# Patient Record
Sex: Male | Born: 1955 | Race: White | Hispanic: No | Marital: Married | Smoking: Current every day smoker
Health system: Southern US, Community
[De-identification: ages and names within clinical notes are randomized; demographics above are authoritative.]

## PROBLEM LIST (undated history)

## (undated) DIAGNOSIS — J449 Chronic obstructive pulmonary disease, unspecified: Secondary | ICD-10-CM

## (undated) DIAGNOSIS — I2510 Atherosclerotic heart disease of native coronary artery without angina pectoris: Secondary | ICD-10-CM

## (undated) DIAGNOSIS — F419 Anxiety disorder, unspecified: Secondary | ICD-10-CM

## (undated) DIAGNOSIS — I700 Atherosclerosis of aorta: Secondary | ICD-10-CM

## (undated) DIAGNOSIS — T7840XA Allergy, unspecified, initial encounter: Secondary | ICD-10-CM

## (undated) HISTORY — DX: Anxiety disorder, unspecified: F41.9

## (undated) HISTORY — DX: Atherosclerotic heart disease of native coronary artery without angina pectoris: I25.10

## (undated) HISTORY — DX: Allergy, unspecified, initial encounter: T78.40XA

## (undated) HISTORY — DX: Chronic obstructive pulmonary disease, unspecified: J44.9

## (undated) HISTORY — DX: Atherosclerosis of aorta: I70.0

---

## 2014-11-20 ENCOUNTER — Ambulatory Visit (INDEPENDENT_AMBULATORY_CARE_PROVIDER_SITE_OTHER): Payer: Self-pay | Admitting: Family Medicine

## 2014-11-20 ENCOUNTER — Encounter: Payer: Self-pay | Admitting: Family Medicine

## 2014-11-20 VITALS — BP 155/86 | HR 86 | Temp 98.3°F | Resp 19 | Ht 72.0 in | Wt 220.4 lb

## 2014-11-20 DIAGNOSIS — F419 Anxiety disorder, unspecified: Secondary | ICD-10-CM | POA: Insufficient documentation

## 2014-11-20 DIAGNOSIS — F411 Generalized anxiety disorder: Secondary | ICD-10-CM

## 2014-11-20 DIAGNOSIS — R030 Elevated blood-pressure reading, without diagnosis of hypertension: Secondary | ICD-10-CM

## 2014-11-20 MED ORDER — CLONAZEPAM 0.5 MG PO TABS: 0.5000 mg | ORAL_TABLET | Freq: Two times a day (BID) | ORAL | Status: DC | PRN

## 2014-11-20 NOTE — Progress Notes (Signed)
Name: Stephen [name]   MRN: [id number]    DOB: 26-Oct-1955   Date:11/20/2014       Progress Note  Subjective  Chief Complaint  Chief Complaint  Patient presents with  . Establish Care    NP/patient wants prescription of a certain medication     Anxiety Presents for follow-up visit. Symptoms include excessive worry, irritability and nervous/anxious behavior. Patient reports no chest pain, insomnia, palpitations or shortness of breath. The symptoms are aggravated by family issues and work stress. The quality of sleep is fair (difficulty going back to sleep). Nighttime awakenings: one to two.   Past treatments include benzodiazephines (pt. has taken Klonopin  from his friend in the past and felt significant improvement in his symptoms of anxiety and sleep quality. ).  patient is requesting to be started on Klonopin for symptoms of anxiety.   History reviewed. No pertinent past medical history.  History reviewed. No pertinent past surgical history.  Family History  Problem Relation Age of Onset  . Hypertension Mother   . Hypothyroidism Mother   . Alzheimer's disease Father     Social History   Social History  . Marital Status: Married    Spouse Name: N/A  . Number of Children: N/A  . Years of Education: N/A   Occupational History  . Not on file.   Social History Main Topics  . Smoking status: Current Some Day Smoker -- 0.50 packs/day for 30 years    Types: Cigarettes  . Smokeless tobacco: Never Used  . Alcohol Use: No  . Drug Use: No  . Sexual Activity: Not on file   Other Topics Concern  . Not on file   Social History Narrative  . No narrative on file    No current outpatient prescriptions on file.  No Known Allergies   Review of Systems  Constitutional: Positive for irritability. Negative for fever and chills.  Respiratory: Negative for shortness of breath.   Cardiovascular: Negative for chest pain and palpitations.  Psychiatric/Behavioral: Negative for  depression. The patient is nervous/anxious. The patient does not have insomnia.      Objective  Filed Vitals:   11/20/14 1529  BP: 142/88  Pulse: 86  Temp: 98.3 F (36.8 C)  TempSrc: Oral  Resp: 19  Height: 6' (1.829 m)  Weight: 220 lb 6.4 oz (99.973 kg)  SpO2: 95%    Physical Exam  Constitutional: He is oriented to person, place, and time and well-developed, well-nourished, and in no distress.  HENT:  Head: Normocephalic and atraumatic.  Cardiovascular: Normal rate and regular rhythm.   Pulmonary/Chest: Effort normal.  Abdominal: Soft. Bowel sounds are normal.  Neurological: He is alert and oriented to person, place, and time.  Psychiatric: Memory, affect and judgment normal.  Nursing note and vitals reviewed.   Assessment & Plan  1. Generalized anxiety disorder Ration will be started on clonazepam 0.5 mg by mouth twice a day when necessary for symptoms of anxiety. Patient has been educated about the dependence potential of clonazepam. He has been asked to take the medication only as needed and only as directed and to contact us if he has any side effects after taking clonazepam. Patient verbalized understanding. Follow-up in one month.  - clonazePAM (KLONOPIN) 0.5 MG tablet; Take 1 tablet (0.5 mg total) by mouth 2 (two) times daily as needed for anxiety.  Dispense: 60 tablet; Refill: 0  2. Elevated blood-pressure reading without diagnosis of hypertension Blood pressure is persistently elevated on manual repeat. Patient asked  to keep a BP log to be reviewed in 2 weeks.   Gaston Dase Asad A. [facility] 11/20/2014 4:08 PM

## 2014-12-17 ENCOUNTER — Ambulatory Visit: Payer: Self-pay | Admitting: Family Medicine

## 2014-12-25 ENCOUNTER — Telehealth: Payer: Self-pay | Admitting: Family Medicine

## 2014-12-25 DIAGNOSIS — F411 Generalized anxiety disorder: Secondary | ICD-10-CM

## 2014-12-25 MED ORDER — CLONAZEPAM 0.5 MG PO TABS: 0.5000 mg | ORAL_TABLET | Freq: Two times a day (BID) | ORAL | Status: DC | PRN

## 2014-12-25 NOTE — Telephone Encounter (Signed)
Medication has been refilled patient will pick up prescription

## 2014-12-30 ENCOUNTER — Encounter: Payer: Self-pay | Admitting: Family Medicine

## 2014-12-30 ENCOUNTER — Ambulatory Visit (INDEPENDENT_AMBULATORY_CARE_PROVIDER_SITE_OTHER): Payer: Self-pay | Admitting: Family Medicine

## 2014-12-30 VITALS — BP 132/80 | HR 91 | Temp 98.0°F | Resp 18 | Ht 72.0 in | Wt 217.8 lb

## 2014-12-30 DIAGNOSIS — F411 Generalized anxiety disorder: Secondary | ICD-10-CM

## 2014-12-30 MED ORDER — CLONAZEPAM 1 MG PO TABS: 1.0000 mg | ORAL_TABLET | Freq: Two times a day (BID) | ORAL | Status: DC | PRN

## 2014-12-30 NOTE — Progress Notes (Signed)
Name: Stephen [name]   MRN: [id number]    DOB: 04/06/1955   Date:12/30/2014       Progress Note  Subjective  Chief Complaint  Chief Complaint  Patient presents with  . Follow-up    1 mo  . Anxiety    Anxiety Presents for follow-up visit. Symptoms include excessive worry, insomnia, irritability, muscle tension, nervous/anxious behavior, panic and shortness of breath.   There is no history of depression. Past treatments include benzodiazephines. The treatment provided moderate relief. Compliance with prior treatments has been good.  patient reports Klonopin is working moderately well for symptoms of anxiety.he is requesting an increase in the dosage to relieve his residual symptoms. Patient has taken the medication as directed, no side effects have been reported.  Past Medical History  Diagnosis Date  . Anxiety   . Allergy     seasonal    History reviewed. No pertinent past surgical history.  Family History  Problem Relation Age of Onset  . Hypertension Mother   . Hypothyroidism Mother   . Alzheimer's disease Father     Social History   Social History  . Marital Status: Married    Spouse Name: N/A  . Number of Children: N/A  . Years of Education: N/A   Occupational History  . Not on file.   Social History Main Topics  . Smoking status: Current Some Day Smoker -- 0.50 packs/day for 30 years    Types: Cigarettes  . Smokeless tobacco: Never Used  . Alcohol Use: No  . Drug Use: No  . Sexual Activity: Not on file   Other Topics Concern  . Not on file   Social History Narrative    Current outpatient prescriptions:  .  clonazePAM (KLONOPIN) 0.5 MG tablet, Take 1 tablet (0.5 mg total) by mouth 2 (two) times daily as needed for anxiety., Disp: 20 tablet, Rfl: 0  No Known Allergies   Review of Systems  Constitutional: Positive for irritability.  Respiratory: Positive for shortness of breath.   Psychiatric/Behavioral: The patient is nervous/anxious and has  insomnia.     Objective  Filed Vitals:   12/30/14 1029  BP: 132/80  Pulse: 91  Temp: 98 F (36.7 C)  TempSrc: Oral  Resp: 18  Height: 6' (1.829 m)  Weight: 217 lb 12.8 oz (98.793 kg)  SpO2: 96%    Physical Exam  Constitutional: He is oriented to person, place, and time and well-developed, well-nourished, and in no distress.  Neurological: He is alert and oriented to person, place, and time.  Psychiatric: Memory, affect and judgment normal.  Nursing note and vitals reviewed.  Assessment & Plan  1. Generalized anxiety disorder Increased dosage to 1 mg twice a day as needed. He is to take 0.5 mg 2 times daily during the day and 1 mg at bedtime as needed.  Recheck in one month. - clonazePAM (KLONOPIN) 1 MG tablet; Take 1 tablet (1 mg total) by mouth 2 (two) times daily as needed for anxiety.  Dispense: 60 tablet; Refill: 0   Syed Asad A. Faylene Kurtz [facility] 12/30/2014 10:40 AM

## 2015-01-13 ENCOUNTER — Telehealth: Payer: Self-pay | Admitting: Family Medicine
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
## 2015-01-14 NOTE — Telephone Encounter (Signed)
Routed to Dr. Shah °

## 2015-01-15 NOTE — Telephone Encounter (Signed)
Patient has appointment scheduled for 01/16/2015

## 2015-01-16 ENCOUNTER — Encounter: Payer: Self-pay | Admitting: Family Medicine

## 2015-01-16 ENCOUNTER — Ambulatory Visit (INDEPENDENT_AMBULATORY_CARE_PROVIDER_SITE_OTHER): Payer: Self-pay | Admitting: Family Medicine

## 2015-01-16 VITALS — BP 132/78 | HR 89 | Temp 98.7°F | Resp 18 | Ht 72.0 in | Wt 218.2 lb

## 2015-01-16 DIAGNOSIS — F411 Generalized anxiety disorder: Secondary | ICD-10-CM

## 2015-01-16 DIAGNOSIS — R062 Wheezing: Secondary | ICD-10-CM | POA: Insufficient documentation

## 2015-01-16 MED ORDER — CLONAZEPAM 1 MG PO TABS: 1.0000 mg | ORAL_TABLET | Freq: Two times a day (BID) | ORAL | Status: DC | PRN
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
## 2015-01-28 ENCOUNTER — Ambulatory Visit: Payer: Self-pay | Admitting: Family Medicine

## 2015-02-16 ENCOUNTER — Encounter: Payer: Self-pay | Admitting: Family Medicine

## 2015-02-16 ENCOUNTER — Ambulatory Visit (INDEPENDENT_AMBULATORY_CARE_PROVIDER_SITE_OTHER): Payer: Self-pay | Admitting: Family Medicine

## 2015-02-16 VITALS — BP 132/72 | HR 88 | Temp 98.0°F | Resp 18 | Ht 72.0 in | Wt 219.1 lb

## 2015-02-16 DIAGNOSIS — R062 Wheezing: Secondary | ICD-10-CM

## 2015-02-16 DIAGNOSIS — F411 Generalized anxiety disorder: Secondary | ICD-10-CM

## 2015-02-16 MED ORDER — CLONAZEPAM 1 MG PO TABS: 1.0000 mg | ORAL_TABLET | Freq: Two times a day (BID) | ORAL | Status: DC | PRN

## 2015-02-16 NOTE — Progress Notes (Signed)
Name: Stephen [name]   MRN: [id number]    DOB: September 11, 1955   Date:02/16/2015       Progress Note  Subjective  Chief Complaint  Chief Complaint  Patient presents with  . Follow-up    1 mo  . Medication Refill    clonazepam     Anxiety Presents for follow-up visit. The problem has been gradually improving (Improved since therapy with clonazepam was started.). Symptoms include excessive worry, insomnia, irritability, malaise and nervous/anxious behavior. Patient reports no chest pain, depressed mood, palpitations or shortness of breath. The severity of symptoms is moderate. The symptoms are aggravated by work stress.   His past medical history is significant for anxiety/panic attacks. There is no history of bipolar disorder or depression. Past treatments include benzodiazephines. The treatment provided significant relief. Compliance with prior treatments has been good.    Past Medical History  Diagnosis Date  . Anxiety   . Allergy     seasonal    History reviewed. No pertinent past surgical history.  Family History  Problem Relation Age of Onset  . Hypertension Mother   . Hypothyroidism Mother   . Alzheimer's disease Father     Social History   Social History  . Marital Status: Married    Spouse Name: N/A  . Number of Children: N/A  . Years of Education: N/A   Occupational History  . Not on file.   Social History Main Topics  . Smoking status: Current Some Day Smoker -- 0.50 packs/day for 30 years    Types: Cigarettes  . Smokeless tobacco: Never Used  . Alcohol Use: No  . Drug Use: No  . Sexual Activity: Not on file   Other Topics Concern  . Not on file   Social History Narrative     Current outpatient prescriptions:  .  clonazePAM (KLONOPIN) 1 MG tablet, Take 1 tablet (1 mg total) by mouth 2 (two) times daily as needed for anxiety., Disp: 60 tablet, Rfl: 0  No Known Allergies   Review of Systems  Constitutional: Positive for irritability. Negative  for fever, chills and malaise/fatigue.  Respiratory: Positive for wheezing. Negative for cough, sputum production and shortness of breath.   Cardiovascular: Negative for chest pain and palpitations.  Psychiatric/Behavioral: Negative for depression. The patient is nervous/anxious and has insomnia.     Objective  Filed Vitals:   02/16/15 1047  BP: 132/72  Pulse: 88  Temp: 98 F (36.7 C)  TempSrc: Oral  Resp: 18  Height: 6' (1.829 m)  Weight: 219 lb 1.6 oz (99.383 kg)  SpO2: 94%    Physical Exam  Constitutional: He is oriented to person, place, and time and well-developed, well-nourished, and in no distress.  Cardiovascular: Normal rate, regular rhythm and normal heart sounds.   Pulmonary/Chest: Effort normal. He has wheezes.  Neurological: He is alert and oriented to person, place, and time.  Psychiatric: Mood, memory, affect and judgment normal.  Nursing note and vitals reviewed.   Assessment & Plan  1. Generalized anxiety disorder  Symptoms of anxiety stable on clonazepam 1 mg twice a day when necessary. Refills provided and follow-up in one month. - clonazePAM (KLONOPIN) 1 MG tablet; Take 1 tablet (1 mg total) by mouth 2 (two) times daily as needed for anxiety.  Dispense: 60 tablet; Refill: 0  2. Wheezing on auscultation  Likely from ongoing tobacco abuse. No respiratory distress and O2 sats are normal. No concerning symptoms. Obtain chest x-ray for assessment - DG Chest 2 View; Future  Lasonja Lakins Asad A. Faylene KurtzShah [facility] 02/16/2015 10:51 AM

## 2015-03-17 ENCOUNTER — Encounter: Payer: Self-pay | Admitting: Emergency Medicine

## 2015-03-17 ENCOUNTER — Other Ambulatory Visit: Payer: Self-pay | Admitting: Family Medicine

## 2015-03-17 ENCOUNTER — Emergency Department
Admission: EM | Admit: 2015-03-17 | Discharge: 2015-03-17 | Disposition: A | Discharge status: Home / Self Care | Payer: Self-pay | Attending: Emergency Medicine | Admitting: Emergency Medicine

## 2015-03-17 DIAGNOSIS — F411 Generalized anxiety disorder: Secondary | ICD-10-CM

## 2015-03-17 DIAGNOSIS — K0381 Cracked tooth: Secondary | ICD-10-CM | POA: Insufficient documentation

## 2015-03-17 DIAGNOSIS — K0889 Other specified disorders of teeth and supporting structures: Secondary | ICD-10-CM | POA: Insufficient documentation

## 2015-03-17 DIAGNOSIS — F419 Anxiety disorder, unspecified: Secondary | ICD-10-CM | POA: Insufficient documentation

## 2015-03-17 DIAGNOSIS — K029 Dental caries, unspecified: Secondary | ICD-10-CM | POA: Insufficient documentation

## 2015-03-17 DIAGNOSIS — F17210 Nicotine dependence, cigarettes, uncomplicated: Secondary | ICD-10-CM | POA: Insufficient documentation

## 2015-03-17 MED ORDER — IBUPROFEN 800 MG PO TABS
800.0000 mg | ORAL_TABLET | Freq: Once | ORAL | Status: AC
  Administered 2015-03-17: 800 mg via ORAL
  Filled 2015-03-17: qty 1

## 2015-03-17 MED ORDER — CLONAZEPAM 1 MG PO TABS: 1.0000 mg | ORAL_TABLET | Freq: Two times a day (BID) | ORAL | Status: DC | PRN

## 2015-03-17 MED ORDER — AMOXICILLIN 500 MG PO CAPS: 500.0000 mg | ORAL_CAPSULE | Freq: Three times a day (TID) | ORAL | Status: DC

## 2015-03-17 MED ORDER — IBUPROFEN 800 MG PO TABS: 800.0000 mg | ORAL_TABLET | Freq: Three times a day (TID) | ORAL | Status: DC | PRN

## 2015-03-17 MED ORDER — TRAMADOL HCL 50 MG PO TABS: 50.0000 mg | ORAL_TABLET | Freq: Four times a day (QID) | ORAL | Status: DC | PRN

## 2015-03-17 MED ORDER — OXYCODONE-ACETAMINOPHEN 5-325 MG PO TABS
1.0000 | ORAL_TABLET | Freq: Once | ORAL | Status: AC
  Administered 2015-03-17: 1 via ORAL
  Filled 2015-03-17: qty 1

## 2015-03-17 MED ORDER — LIDOCAINE VISCOUS 2 % MT SOLN
15.0000 mL | Freq: Once | OROMUCOSAL | Status: AC
  Administered 2015-03-17: 15 mL via OROMUCOSAL
  Filled 2015-03-17: qty 15

## 2015-03-17 NOTE — Telephone Encounter (Signed)
Requesting refill on Klonopin. Patient states that he know it is not due until the 14th however he is working in town today and would like to pick it up.

## 2015-03-17 NOTE — Discharge Instructions (Signed)
Follow-up with process. [facility] or from the list of dental clinic as provided. OPTIONS FOR DENTAL FOLLOW UP CARE  [place] Department of Health and Human Services - Local Safety Net Dental Clinics [email or web address]://www.ncdhhs.gov/dph/oralhealth/services/safetynetclinics.htm   [facility] [phone])  [facility] [phone])  [facility] [phone]  [facility] [phone])  [facility] [phone]) This clinic caters to the indigent population and is on a lottery system. Location: [employer], [address] [address], [address], [place] [facility] Hours: Wednesdays from 6pm - 9pm, patients seen by a lottery system. For dates, call or go to [email or web address] Services: Cleanings, fillings and simple extractions. Payment Options: DENTAL WORK IS FREE OF CHARGE. Bring proof of income or support. Best way to get seen: Arrive at 5:15 pm - this is a lottery, NOT first come/first serve, so arriving earlier will not increase your chances of being seen.     [facility] [phone] Select option 1 for emergencies   Location: [facility], [place] [place], [address], [place] [facility] Hours: No walk-ins accepted - call the day before to schedule an appointment. Check in times are 9:30 am and 1:30 pm. Services: Simple extractions, temporary fillings, pulpectomy/pulp debridement, uncomplicated abscess drainage. Payment Options: PAYMENT IS DUE AT THE TIME OF SERVICE.  Fee is usually $100-200, additional surgical procedures (e.g. abscess drainage) may be extra. Cash, checks, Visa/MasterCard accepted.  Can file Medicaid if patient is covered for dental - patient should call case worker to check. No discount for [facility] patients. Best way to get seen: MUST call the day before and get onto the schedule. Can usually be seen the next  1-2 days. No walk-ins accepted.     [facility] [phone]   Location: [facility], [address], [place] [facility] Hours: M, W, Th, F 8am or 1:30pm, Tues 9a or 1:30 - first come/first served. Services: Simple extractions, temporary fillings, uncomplicated abscess drainage.  You do not need to be an [place] [facility] [place] [facility] [place] resident. Payment Options: PAYMENT IS DUE AT THE TIME OF SERVICE. Dental insurance, otherwise sliding scale - bring proof of income or support. Depending on income and treatment needed, cost is usually $50-200. Best way to get seen: Arrive early as it is first come/first served.     [facility] [phone]   Location: [address] Clinic Hours: Mon-Thu 8a-5p Services: Most basic dental services including extractions and fillings. Payment Options: PAYMENT IS DUE AT THE TIME OF SERVICE. Sliding scale, up to 50% off - bring proof if income or support. Medicaid with dental option accepted. Best way to get seen: Call to schedule an appointment, can usually be seen within 2 weeks OR they will try to see walk-ins - show up at 8a or 2p (you may have to wait).     [facility] [phone] [place] RESIDENTS ONLY   Location: [facility], [address], [place], [place] [address] Clinic Hours: By appointment only. Monday - Thursday 8am-5pm, Friday 8am-12pm Services: Cleanings, fillings, extractions. Payment Options: PAYMENT IS DUE AT THE TIME OF SERVICE. Cash, Visa or MasterCard. Sliding scale - $30 minimum per service. Best way to get seen: Come in to office, complete packet and make an appointment - need proof of income or support monies for each household member and proof of [place] residence. Usually takes about a month to get in.  [facility] [phone]   Location: [address],  [place] Clinic Hours: Walk-in Urgent Care Dental Services are offered Monday-Friday mornings only. The numbers of emergencies accepted daily is limited to the number of providers available. Maximum 15 - Mondays, Wednesdays & Thursdays Maximum 10 - Tuesdays & Fridays Services: You do not need to be a [facility] resident to be seen for a dental emergency. Emergencies are defined as pain, swelling, abnormal bleeding, or dental trauma. Walkins will receive x-rays if needed. NOTE: Dental cleaning is not an emergency. Payment Options: PAYMENT IS DUE AT THE TIME OF SERVICE. Minimum co-pay is $40.00 for uninsured patients. Minimum co-pay is $3.00 for Medicaid with dental coverage. Dental Insurance is accepted and must be presented at time of visit. Medicare does not cover dental. Forms of payment: Cash, credit card, checks. Best way to get seen: If not previously registered with the clinic, walk-in dental registration begins at 7:15 am and is on a first come/first serve basis. If previously registered with the clinic, call to make an appointment.     [facility] [place] ONLY   Location: [address], [place], [place] Clinic Hours: Mon-Thu 10a-2p Services: Extractions only! Payment Options: FREE (donations accepted) - bring proof of income or support Best way to get seen: Call and schedule an appointment OR come at 8am on the 1st Monday of every month (except for holidays) when it is first come/first served.     [facility] [phone]   Location: [place], [place] Clinic Hours: Friday mornings Services, Payment Options, Best way to get seen: Call for info

## 2015-03-17 NOTE — ED Provider Notes (Signed)
[facility] Emergency Department Provider Note  ____________________________________________  Time seen: Approximately 11:54 AM  I have reviewed the triage vital signs and the nursing notes.   HISTORY  Chief Complaint Dental Pain    HPI Stephen [name] is a 59 y.o. male patient complaining of right upper dental pain for 5 days. Patient states nose some swelling to the upper jaw yesterday. Patient has a history of devitalized fracture teeth. Patient has not follow-up with dentist secondary to lack of insurance. Patient is rating his pain as a 10 over 10. Patient is taken 3 tablets of erythromycin that he received from a friend. Patient states nose no improvement with taking those antibiotics.   Past Medical History  Diagnosis Date  . Anxiety   . Allergy     seasonal    Patient Active Problem List   Diagnosis Date Noted  . Wheezing on auscultation 01/16/2015  . Anxiety disorder 11/20/2014    History reviewed. No pertinent past surgical history.  Current Outpatient Rx  Name  Route  Sig  Dispense  Refill  . amoxicillin (AMOXIL) 500 MG capsule   Oral   Take 1 capsule (500 mg total) by mouth 3 (three) times daily.   30 capsule   0   . clonazePAM (KLONOPIN) 1 MG tablet   Oral   Take 1 tablet (1 mg total) by mouth 2 (two) times daily as needed for anxiety.   60 tablet   0   . ibuprofen (ADVIL,MOTRIN) 800 MG tablet   Oral   Take 1 tablet (800 mg total) by mouth every 8 (eight) hours as needed.   30 tablet   0   . traMADol (ULTRAM) 50 MG tablet   Oral   Take 1 tablet (50 mg total) by mouth every 6 (six) hours as needed.   20 tablet   0     Allergies Review of patient's allergies indicates no known allergies.  Family History  Problem Relation Age of Onset  . Hypertension Mother   . Hypothyroidism Mother   . Alzheimer's disease Father     Social History Social History  Substance Use Topics  . Smoking status: Current Some Day Smoker  -- 0.50 packs/day for 30 years    Types: Cigarettes  . Smokeless tobacco: Never Used  . Alcohol Use: No    Review of Systems Constitutional: No fever/chills Eyes: No visual changes. ENT: No sore throat. Dental pain Cardiovascular: Denies chest pain. Respiratory: Denies shortness of breath. Gastrointestinal: No abdominal pain.  No nausea, no vomiting.  No diarrhea.  No constipation. Genitourinary: Negative for dysuria. Musculoskeletal: Negative for back pain. Skin: Negative for rash. Neurological: Negative for headaches, focal weakness or numbness. Psychiatric:Anxiety 10-point ROS otherwise negative.  ____________________________________________   PHYSICAL EXAM:  VITAL SIGNS: ED Triage Vitals  Enc Vitals Group     BP 03/17/15 1143 153/88 mmHg     Pulse Rate 03/17/15 1143 83     Resp 03/17/15 1143 16     Temp 03/17/15 1143 98.3 F (36.8 C)     Temp Source 03/17/15 1143 Oral     SpO2 03/17/15 1143 95 %     Weight 03/17/15 1143 220 lb (99.791 kg)     Height 03/17/15 1143 6' (1.829 m)     Head Cir --      Peak Flow --      Pain Score 03/17/15 1144 10     Pain Loc --      Pain Edu? --  Excl. in GC? --     Constitutional: Alert and oriented. Well appearing and in no acute distress. Eyes: Conjunctivae are normal. PERRL. EOMI. Head: Atraumatic. Nose: No congestion/rhinnorhea. Mouth/Throat: Mucous membranes are moist.  Oropharynx non-erythematous. Multiple devitalized fracture teeth upper and lower right side Neck: No stridor.  No cervical spine tenderness to palpation. Hematological/Lymphatic/Immunilogical: No cervical lymphadenopathy. Cardiovascular: Normal rate, regular rhythm. Grossly normal heart sounds.  Good peripheral circulation. Respiratory: Normal respiratory effort.  No retractions. Lungs CTAB. Gastrointestinal: Soft and nontender. No distention. No abdominal bruits. No CVA tenderness. Musculoskeletal: No lower extremity tenderness nor edema.  No joint  effusions. Neurologic:  Normal speech and language. No gross focal neurologic deficits are appreciated. No gait instability. Skin:  Skin is warm, dry and intact. No rash noted. Psychiatric: Mood and affect are normal. Speech and behavior are normal.  ____________________________________________   LABS (all labs ordered are listed, but only abnormal results are displayed)  Labs Reviewed - No data to display ____________________________________________  EKG   ____________________________________________  RADIOLOGY   ____________________________________________   PROCEDURES  Procedure(s) performed: None  Critical Care performed: No  ____________________________________________   INITIAL IMPRESSION / ASSESSMENT AND PLAN / ED COURSE  Pertinent labs & imaging results that were available during my care of the patient were reviewed by me and considered in my medical decision making (see chart for details).  Dental pain secondary to fractured teeth and caries. Patient given prescription for amoxicillin. tramadol and ibuprofen. Patient advised to follow with the [facility]. ____________________________________________   FINAL CLINICAL IMPRESSION(S) / ED DIAGNOSES  Final diagnoses:  Pain due to dental caries       Joni Reiningonald K Smith, PA-C 03/17/15 1223  Minna AntisKevin Paduchowski, MD 03/17/15 [phone]

## 2015-03-17 NOTE — Telephone Encounter (Signed)
Routed to Dr. Sherryll BurgerShah for approval, Requesting refill on Klonopin. Patient states that he know it is not due until the 14th however he is working in town today and would like to pick it up.

## 2015-03-17 NOTE — ED Notes (Signed)
C/o right upper dental pain.  Onset of symptoms Thursday.  Right upper jaw swelling noted.

## 2015-03-17 NOTE — ED Notes (Signed)
Patient has taken 3 tablets of Erythromycin 250 mg tablets that he had gotten from a friend.

## 2015-03-17 NOTE — ED Notes (Signed)
AAOx3.  Skin warm and dry.  NAD.  D/C home 

## 2015-03-18 ENCOUNTER — Telehealth: Payer: Self-pay | Admitting: Family Medicine

## 2015-03-18 NOTE — Telephone Encounter (Signed)
Provider has signed prescription and it was to front staff to place in RX book.  Pt was notified that the prescription was ready for pick up.

## 2015-03-18 NOTE — Telephone Encounter (Signed)
Pt would like a refill on Klonipan.

## 2015-03-18 NOTE — Telephone Encounter (Signed)
PT IS NEEDING REFILL FOR ANXIETY. HE IS GOING OUT OF TOWN AND NEEDS THIS TODAY.

## 2015-04-15 ENCOUNTER — Other Ambulatory Visit: Payer: Self-pay | Admitting: Family Medicine

## 2015-04-15 DIAGNOSIS — F411 Generalized anxiety disorder: Secondary | ICD-10-CM

## 2015-04-15 MED ORDER — CLONAZEPAM 1 MG PO TABS: 1.0000 mg | ORAL_TABLET | Freq: Two times a day (BID) | ORAL | Status: DC | PRN

## 2015-04-15 NOTE — Telephone Encounter (Signed)
Pt needs refill on Klonipen. Pt will be out on Saturday 04/18/15

## 2015-04-15 NOTE — Telephone Encounter (Signed)
Routed to Dr. Shah for medication refill approval 

## 2015-05-20 ENCOUNTER — Ambulatory Visit (INDEPENDENT_AMBULATORY_CARE_PROVIDER_SITE_OTHER): Payer: Self-pay | Admitting: Family Medicine

## 2015-05-20 ENCOUNTER — Encounter: Payer: Self-pay | Admitting: Family Medicine

## 2015-05-20 VITALS — BP 132/78 | HR 88 | Temp 98.9°F | Resp 19 | Ht 72.0 in | Wt 220.2 lb

## 2015-05-20 DIAGNOSIS — IMO0001 Reserved for inherently not codable concepts without codable children: Secondary | ICD-10-CM | POA: Insufficient documentation

## 2015-05-20 DIAGNOSIS — R062 Wheezing: Secondary | ICD-10-CM

## 2015-05-20 DIAGNOSIS — R030 Elevated blood-pressure reading, without diagnosis of hypertension: Secondary | ICD-10-CM

## 2015-05-20 DIAGNOSIS — F411 Generalized anxiety disorder: Secondary | ICD-10-CM

## 2015-05-20 MED ORDER — CLONAZEPAM 1 MG PO TABS: 1.0000 mg | ORAL_TABLET | Freq: Two times a day (BID) | ORAL | Status: DC | PRN

## 2015-05-20 NOTE — Progress Notes (Signed)
Name: Gianluca Chhim   MRN: [id number]    DOB: 11-12-1955   Date:05/20/2015       Progress Note  Subjective  Chief Complaint  Chief Complaint  Patient presents with  . Follow-up    3 mo  . Anxiety  . Medication Refill    clonazepam 1 mg     Anxiety Presents for follow-up visit. The problem has been gradually improving. Patient reports no chest pain, excessive worry, insomnia, irritability, nervous/anxious behavior, panic or shortness of breath.   Past treatments include benzodiazephines. The treatment provided significant relief. Compliance with prior treatments has been good.     Past Medical History  Diagnosis Date  . Anxiety   . Allergy     seasonal    History reviewed. No pertinent past surgical history.  Family History  Problem Relation Age of Onset  . Hypertension Mother   . Hypothyroidism Mother   . Alzheimer's disease Father     Social History   Social History  . Marital Status: Married    Spouse Name: N/A  . Number of Children: N/A  . Years of Education: N/A   Occupational History  . Not on file.   Social History Main Topics  . Smoking status: Current Some Day Smoker -- 0.50 packs/day for 30 years    Types: Cigarettes  . Smokeless tobacco: Never Used  . Alcohol Use: No  . Drug Use: No  . Sexual Activity: Not on file   Other Topics Concern  . Not on file   Social History Narrative     Current outpatient prescriptions:  .  clonazePAM (KLONOPIN) 1 MG tablet, Take 1 tablet (1 mg total) by mouth 2 (two) times daily as needed for anxiety., Disp: 60 tablet, Rfl: 0 .  amoxicillin (AMOXIL) 500 MG capsule, Take 1 capsule (500 mg total) by mouth 3 (three) times daily. (Patient not taking: Reported on 05/20/2015), Disp: 30 capsule, Rfl: 0 .  ibuprofen (ADVIL,MOTRIN) 800 MG tablet, Take 1 tablet (800 mg total) by mouth every 8 (eight) hours as needed. (Patient not taking: Reported on 05/20/2015), Disp: 30 tablet, Rfl: 0 .  traMADol (ULTRAM) 50 MG tablet,  Take 1 tablet (50 mg total) by mouth every 6 (six) hours as needed. (Patient not taking: Reported on 05/20/2015), Disp: 20 tablet, Rfl: 0  No Known Allergies   Review of Systems  Constitutional: Negative for irritability.  Respiratory: Negative for shortness of breath.   Cardiovascular: Negative for chest pain.  Psychiatric/Behavioral: The patient is not nervous/anxious and does not have insomnia.      Objective  Filed Vitals:   05/20/15 0819  BP: 144/78  Pulse: 88  Temp: 98.9 F (37.2 C)  TempSrc: Oral  Resp: 19  Height: 6' (1.829 m)  Weight: 220 lb 3.2 oz (99.882 kg)  SpO2: 95%    Physical Exam  Constitutional: He is oriented to person, place, and time and well-developed, well-nourished, and in no distress.  HENT:  Head: Normocephalic and atraumatic.  Cardiovascular: Normal rate and regular rhythm.   Pulmonary/Chest: Effort normal. He has wheezes.  Neurological: He is alert and oriented to person, place, and time.  Psychiatric: Mood, memory, affect and judgment normal.  Nursing note and vitals reviewed.      Assessment & Plan  1. Generalized anxiety disorder Improving, no side effects, compliant with controlled substances agreement. Refills provided for 3 months. - clonazePAM (KLONOPIN) 1 MG tablet; Take 1 tablet (1 mg total) by mouth 2 (two) times daily as  needed for anxiety.  Dispense: 60 tablet; Refill: 2  2. Elevated BP Repeat BP is better and at goal. Advised to check his blood pressure regularly. Follow-up in 3  3. Wheezing on auscultation Never got the CXR ordered at last office visit, diffuse wheezing. Likely from smoking.   Valin Massie Asad A. [facility] 05/20/2015 8:37 AM

## 2015-08-17 ENCOUNTER — Ambulatory Visit (INDEPENDENT_AMBULATORY_CARE_PROVIDER_SITE_OTHER): Payer: Self-pay | Admitting: Family Medicine

## 2015-08-17 ENCOUNTER — Encounter: Payer: Self-pay | Admitting: Family Medicine

## 2015-08-17 VITALS — BP 124/70 | HR 92 | Temp 98.5°F | Resp 18 | Ht 72.0 in | Wt 221.6 lb

## 2015-08-17 DIAGNOSIS — R062 Wheezing: Secondary | ICD-10-CM

## 2015-08-17 DIAGNOSIS — F411 Generalized anxiety disorder: Secondary | ICD-10-CM

## 2015-08-17 MED ORDER — ALBUTEROL SULFATE HFA 108 (90 BASE) MCG/ACT IN AERS: 2.0000 | INHALATION_SPRAY | Freq: Four times a day (QID) | RESPIRATORY_TRACT | Status: DC | PRN

## 2015-08-17 MED ORDER — CLONAZEPAM 1 MG PO TABS: 1.0000 mg | ORAL_TABLET | Freq: Two times a day (BID) | ORAL | Status: DC | PRN

## 2015-08-17 NOTE — Progress Notes (Signed)
Name: Stephen [name]   MRN: [id number]    DOB: 02-04-1956   Date:08/17/2015       Progress Note  Subjective  Chief Complaint  Chief Complaint  Patient presents with  . Anxiety    medication refill    Anxiety Presents for follow-up visit. The problem has been unchanged. Symptoms include excessive worry, insomnia, irritability and nervous/anxious behavior.   Past treatments include benzodiazephines. The treatment provided significant relief. Compliance with prior treatments has been good.     Past Medical History  Diagnosis Date  . Anxiety   . Allergy     seasonal    History reviewed. No pertinent past surgical history.  Family History  Problem Relation Age of Onset  . Hypertension Mother   . Hypothyroidism Mother   . Alzheimer's disease Father     Social History   Social History  . Marital Status: Married    Spouse Name: N/A  . Number of Children: N/A  . Years of Education: N/A   Occupational History  . Not on file.   Social History Main Topics  . Smoking status: Current Some Day Smoker -- 0.50 packs/day for 30 years    Types: Cigarettes  . Smokeless tobacco: Never Used  . Alcohol Use: No  . Drug Use: No  . Sexual Activity: Not on file   Other Topics Concern  . Not on file   Social History Narrative     Current outpatient prescriptions:  .  clonazePAM (KLONOPIN) 1 MG tablet, Take 1 tablet (1 mg total) by mouth 2 (two) times daily as needed for anxiety., Disp: 60 tablet, Rfl: 2 .  ibuprofen (ADVIL,MOTRIN) 800 MG tablet, Take 1 tablet (800 mg total) by mouth every 8 (eight) hours as needed. (Patient not taking: Reported on 05/20/2015), Disp: 30 tablet, Rfl: 0  No Known Allergies   Review of Systems  Constitutional: Positive for irritability. Negative for fever and chills.  Respiratory: Positive for wheezing. Negative for cough.   Psychiatric/Behavioral: The patient is nervous/anxious and has insomnia.     Objective  Filed Vitals:   08/17/15 1008   BP: 124/70  Pulse: 92  Temp: 98.5 F (36.9 C)  TempSrc: Oral  Resp: 18  Height: 6' (1.829 m)  Weight: 221 lb 9.6 oz (100.517 kg)  SpO2: 96%    Physical Exam  Constitutional: He is oriented to person, place, and time and well-developed, well-nourished, and in no distress.  HENT:  Head: Normocephalic and atraumatic.  Cardiovascular: Normal rate and regular rhythm.   Pulmonary/Chest: Effort normal.  Neurological: He is alert and oriented to person, place, and time.  Psychiatric: Mood, memory, affect and judgment normal.  Nursing note and vitals reviewed.   Assessment & Plan  1. Generalized anxiety disorder Stable and responsive to clonazepam taken twice daily as needed. Refills provided follow-up in 3 months - clonazePAM (KLONOPIN) 1 MG tablet; Take 1 tablet (1 mg total) by mouth 2 (two) times daily as needed for anxiety.  Dispense: 60 tablet; Refill: 2  2. Wheezing on auscultation We have recommended a chest x-ray in the past to evaluate for any cardiopulmonary abnormalities, patient continues to smoke. We will order Proventil to be taken as needed and return for testing for possible COPD - albuterol (PROVENTIL HFA;VENTOLIN HFA) 108 (90 Base) MCG/ACT inhaler; Inhale 2 puffs into the lungs every 6 (six) hours as needed for wheezing or shortness of breath.  Dispense: 1 Inhaler; Refill: 0    Raiana Pharris Asad A. Faylene KurtzShah [facility] 08/17/2015 10:40 AM

## 2015-09-21 ENCOUNTER — Other Ambulatory Visit: Payer: Self-pay | Admitting: Family Medicine

## 2015-11-18 ENCOUNTER — Encounter: Payer: Self-pay | Admitting: Family Medicine

## 2015-11-18 ENCOUNTER — Ambulatory Visit (INDEPENDENT_AMBULATORY_CARE_PROVIDER_SITE_OTHER): Payer: Self-pay | Admitting: Family Medicine

## 2015-11-18 DIAGNOSIS — F411 Generalized anxiety disorder: Secondary | ICD-10-CM

## 2015-11-18 DIAGNOSIS — J438 Other emphysema: Secondary | ICD-10-CM | POA: Insufficient documentation

## 2015-11-18 DIAGNOSIS — J449 Chronic obstructive pulmonary disease, unspecified: Secondary | ICD-10-CM | POA: Insufficient documentation

## 2015-11-18 MED ORDER — BUDESONIDE-FORMOTEROL FUMARATE 160-4.5 MCG/ACT IN AERO: 2.0000 | INHALATION_SPRAY | Freq: Two times a day (BID) | RESPIRATORY_TRACT | 0 refills | Status: DC

## 2015-11-18 MED ORDER — CLONAZEPAM 1 MG PO TABS: 1.0000 mg | ORAL_TABLET | Freq: Two times a day (BID) | ORAL | 2 refills | Status: DC | PRN

## 2015-11-18 NOTE — Progress Notes (Signed)
Name: Stephen PippinsJohn Camera   MRN: [id number]    DOB: [birth year]   Date:11/18/2015       Progress Note  Subjective  Chief Complaint  Chief Complaint  Patient presents with  . Anxiety    follow up medication refills    Anxiety  Presents for follow-up visit. Symptoms include excessive worry, insomnia, irritability and nervous/anxious behavior. Patient reports no chest pain or shortness of breath. Symptoms occur most days.    Chronic Obstructive Pulmonary Disease: Pt. Has symptoms of dyspnea on exertion, chronic cough with occasional mucus production. He smokes 1 PPD, has been using Albuterol for symptomatic control but feels like it is not helping like before. He has never been on an inhaled corticosteroid before. Never had spirometry.  Past Medical History:  Diagnosis Date  . Allergy    seasonal  . Anxiety     No past surgical history on file.  Family History  Problem Relation Age of Onset  . Hypertension Mother   . Hypothyroidism Mother   . Alzheimer's disease Father     Social History   Social History  . Marital status: Married    Spouse name: N/A  . Number of children: N/A  . Years of education: N/A   Occupational History  . Not on file.   Social History Main Topics  . Smoking status: Current Some Day Smoker    Packs/day: 0.50    Years: 30.00    Types: Cigarettes  . Smokeless tobacco: Never Used  . Alcohol use No  . Drug use: No  . Sexual activity: Not on file   Other Topics Concern  . Not on file   Social History Narrative  . No narrative on file     Current Outpatient Prescriptions:  .  clonazePAM (KLONOPIN) 1 MG tablet, Take 1 tablet (1 mg total) by mouth 2 (two) times daily as needed for anxiety., Disp: 60 tablet, Rfl: 2 .  ibuprofen (ADVIL,MOTRIN) 800 MG tablet, Take 1 tablet (800 mg total) by mouth every 8 (eight) hours as needed., Disp: 30 tablet, Rfl: 0 .  VENTOLIN HFA 108 (90 Base) MCG/ACT inhaler, INHALE 2 PUFFS INTO THE LUNGS EVERY 6 HOURS AS  NEEDED FOR WHEEZING ORSHORTNESS OF BREATH, Disp: 18 g, Rfl: 2  No Known Allergies   Review of Systems  Constitutional: Positive for irritability. Negative for chills and fever.  Respiratory: Negative for shortness of breath.   Cardiovascular: Negative for chest pain.  Psychiatric/Behavioral: The patient is nervous/anxious and has insomnia.      Objective  Vitals:   11/18/15 0843  BP: 132/70  Pulse: 88  Resp: 16  Temp: 98.4 F (36.9 C)  TempSrc: Oral  SpO2: 96%  Weight: 221 lb 9.6 oz (100.5 kg)  Height: 6' (1.829 m)    Physical Exam  Constitutional: He is well-developed, well-nourished, and in no distress.  HENT:  Head: Normocephalic and atraumatic.  Cardiovascular: Normal rate and regular rhythm.   No murmur heard. Pulmonary/Chest: No respiratory distress. He has wheezes in the right lower field and the left lower field.  Musculoskeletal:       Right ankle: He exhibits no swelling.       Left ankle: He exhibits no swelling.  Psychiatric: Mood, memory, affect and judgment normal.  Nursing note and vitals reviewed.    Assessment & Plan 1. Generalized anxiety disorder Stable, responsive to clonazepam taken twice daily as needed, compliant with controlled substances agreement - clonazePAM (KLONOPIN) 1 MG tablet; Take 1 tablet (1  mg total) by mouth 2 (two) times daily as needed for anxiety.  Dispense: 60 tablet; Refill: 2  2. Chronic obstructive pulmonary disease, unspecified COPD type (HCC) Worsening symptoms including dyspnea on exertion, cough, and fatigue. We'll add inhaled corticosteroid with long-acting bronchodilator. Advised to use rescue inhaler as needed. Should see symptomatic improvement. Return in 6 weeks for spirometry. - budesonide-formoterol (SYMBICORT) 160-4.5 MCG/ACT inhaler; Inhale 2 puffs into the lungs 2 (two) times daily.  Dispense: 1 Inhaler; Refill: 0    Clotine Heiner Asad A. Faylene KurtzShah [facility] 11/18/2015 8:59  AM

## 2015-12-02 ENCOUNTER — Other Ambulatory Visit: Payer: Self-pay | Admitting: Family Medicine

## 2015-12-02 DIAGNOSIS — J449 Chronic obstructive pulmonary disease, unspecified: Secondary | ICD-10-CM

## 2016-02-12 ENCOUNTER — Telehealth: Payer: Self-pay | Admitting: Family Medicine

## 2016-02-12 DIAGNOSIS — F411 Generalized anxiety disorder: Secondary | ICD-10-CM

## 2016-02-12 MED ORDER — CLONAZEPAM 1 MG PO TABS: 1.0000 mg | ORAL_TABLET | Freq: Two times a day (BID) | ORAL | 0 refills | Status: DC | PRN

## 2016-02-12 NOTE — Telephone Encounter (Signed)
Pt rescheduled his appt for next week and he hopes he can make that appt. Pt requesting for refill on Klonopin to be picked up today if possible. Pt states he is going out of town early next week.  Please advise.

## 2016-02-12 NOTE — Telephone Encounter (Signed)
Prescription for Clonazepam is ready for pickup.

## 2016-02-17 ENCOUNTER — Ambulatory Visit: Payer: Self-pay | Admitting: Family Medicine

## 2016-02-18 ENCOUNTER — Ambulatory Visit (INDEPENDENT_AMBULATORY_CARE_PROVIDER_SITE_OTHER): Payer: Self-pay | Admitting: Family Medicine

## 2016-02-18 ENCOUNTER — Ambulatory Visit: Payer: Self-pay | Admitting: Family Medicine

## 2016-02-18 ENCOUNTER — Encounter: Payer: Self-pay | Admitting: Family Medicine

## 2016-02-18 VITALS — BP 116/60 | HR 103 | Temp 98.1°F | Ht 72.0 in | Wt 225.9 lb

## 2016-02-18 DIAGNOSIS — F411 Generalized anxiety disorder: Secondary | ICD-10-CM

## 2016-02-18 DIAGNOSIS — J449 Chronic obstructive pulmonary disease, unspecified: Secondary | ICD-10-CM

## 2016-02-18 MED ORDER — CLONAZEPAM 1 MG PO TABS: 1.0000 mg | ORAL_TABLET | Freq: Two times a day (BID) | ORAL | 2 refills | Status: DC | PRN

## 2016-02-18 NOTE — Progress Notes (Signed)
Name: Stephen PippinsJohn [name]   MRN: [id number]    DOB: 02/21/1956   Date:02/18/2016       Progress Note  Subjective  Chief Complaint  Chief Complaint  Patient presents with  . Follow-up    for klonopine     Anxiety  Presents for follow-up visit. Patient reports no depressed mood, excessive worry, insomnia, irritability, nervous/anxious behavior or panic. The severity of symptoms is moderate.      Past Medical History:  Diagnosis Date  . Allergy    seasonal  . Anxiety     No past surgical history on file.  Family History  Problem Relation Age of Onset  . Hypertension Mother   . Hypothyroidism Mother   . Alzheimer's disease Father     Social History   Social History  . Marital status: Married    Spouse name: N/A  . Number of children: N/A  . Years of education: N/A   Occupational History  . Not on file.   Social History Main Topics  . Smoking status: Current Some Day Smoker    Packs/day: 0.50    Years: 30.00    Types: Cigarettes  . Smokeless tobacco: Never Used  . Alcohol use No  . Drug use: No  . Sexual activity: Not on file   Other Topics Concern  . Not on file   Social History Narrative  . No narrative on file     Current Outpatient Prescriptions:  .  clonazePAM (KLONOPIN) 1 MG tablet, Take 1 tablet (1 mg total) by mouth 2 (two) times daily as needed for anxiety., Disp: 14 tablet, Rfl: 0 .  ibuprofen (ADVIL,MOTRIN) 800 MG tablet, Take 1 tablet (800 mg total) by mouth every 8 (eight) hours as needed., Disp: 30 tablet, Rfl: 0 .  SYMBICORT 160-4.5 MCG/ACT inhaler, INHALE TWO PUFFS TWICE DAILY, Disp: 10.2 g, Rfl: 5 .  VENTOLIN HFA 108 (90 Base) MCG/ACT inhaler, INHALE 2 PUFFS INTO THE LUNGS EVERY 6 HOURS AS NEEDED FOR WHEEZING ORSHORTNESS OF BREATH, Disp: 18 g, Rfl: 2  No Known Allergies   Review of Systems  Constitutional: Negative for irritability.  Psychiatric/Behavioral: The patient is not nervous/anxious and does not have insomnia.      Objective  Vitals:   02/18/16 1558  BP: 116/60  Pulse: (!) 103  Temp: 98.1 F (36.7 C)  SpO2: 96%  Weight: 225 lb 14.4 oz (102.5 kg)  Height: 6' (1.829 m)    Physical Exam  Constitutional: He is oriented to person, place, and time and well-developed, well-nourished, and in no distress.  HENT:  Head: Normocephalic and atraumatic.  Cardiovascular: Normal rate, regular rhythm and normal heart sounds.   No murmur heard. Pulmonary/Chest: Effort normal. No respiratory distress. He has no decreased breath sounds. He has wheezes in the right middle field, the right lower field, the left middle field and the left lower field. He has no rhonchi.  Neurological: He is alert and oriented to person, place, and time.  Psychiatric: Mood, memory, affect and judgment normal.  Nursing note and vitals reviewed.       Assessment & Plan  1. Generalized anxiety disorder Stable and responsive to clonazepam taken twice daily when needed. Refills provided - clonazePAM (KLONOPIN) 1 MG tablet; Take 1 tablet (1 mg total) by mouth 2 (two) times daily as needed for anxiety.  Dispense: 60 tablet; Refill: 2  2. Chronic obstructive pulmonary disease, unspecified COPD type (HCC) Patient now taking Symbicort and Ventolin, still continues to smoke.  Diyan Dave Asad A. Faylene KurtzShah [facility] 02/18/2016 4:22 PM

## 2016-03-05 ENCOUNTER — Other Ambulatory Visit: Payer: Self-pay | Admitting: Family Medicine

## 2016-03-06 ENCOUNTER — Emergency Department
Admission: EM | Admit: 2016-03-06 | Discharge: 2016-03-06 | Disposition: A | Discharge status: Home / Self Care | Payer: Self-pay | Attending: Emergency Medicine | Admitting: Emergency Medicine

## 2016-03-06 DIAGNOSIS — J449 Chronic obstructive pulmonary disease, unspecified: Secondary | ICD-10-CM | POA: Insufficient documentation

## 2016-03-06 DIAGNOSIS — K047 Periapical abscess without sinus: Secondary | ICD-10-CM

## 2016-03-06 DIAGNOSIS — F17210 Nicotine dependence, cigarettes, uncomplicated: Secondary | ICD-10-CM | POA: Insufficient documentation

## 2016-03-06 DIAGNOSIS — Z79899 Other long term (current) drug therapy: Secondary | ICD-10-CM | POA: Insufficient documentation

## 2016-03-06 MED ORDER — HYDROCODONE-ACETAMINOPHEN 5-325 MG PO TABS
ORAL_TABLET | ORAL | Status: AC
  Filled 2016-03-06: qty 2

## 2016-03-06 MED ORDER — AMOXICILLIN 500 MG PO CAPS: 500.0000 mg | ORAL_CAPSULE | Freq: Three times a day (TID) | ORAL | 0 refills | Status: DC

## 2016-03-06 MED ORDER — HYDROCODONE-ACETAMINOPHEN 5-325 MG PO TABS: 1.0000 | ORAL_TABLET | ORAL | 0 refills | Status: DC | PRN

## 2016-03-06 MED ORDER — LIDOCAINE HCL (PF) 1 % IJ SOLN
INTRAMUSCULAR | Status: AC
  Filled 2016-03-06: qty 5

## 2016-03-06 MED ORDER — HYDROCODONE-ACETAMINOPHEN 5-325 MG PO TABS
2.0000 | ORAL_TABLET | Freq: Once | ORAL | Status: AC
Start: 2016-03-06 — End: 2016-03-06
  Administered 2016-03-06: 2 via ORAL

## 2016-03-06 MED ORDER — CEFTRIAXONE SODIUM 1 G IJ SOLR
1.0000 g | Freq: Once | INTRAMUSCULAR | Status: AC
  Administered 2016-03-06: 1 g via INTRAMUSCULAR
  Filled 2016-03-06: qty 10

## 2016-03-06 NOTE — ED Notes (Signed)
See triage note  States he has a broken tooth on the left   Developed pain yesterday  Woke up this am with increased pain and swelling..tooke OTC meds yesterday w/o relief

## 2016-03-06 NOTE — ED Provider Notes (Signed)
[facility] Emergency Department Provider Note  ____________________________________________   First MD Initiated Contact with Patient 03/06/16 [phone]     (approximate)  I have reviewed the triage vital signs and the nursing notes.   HISTORY  Chief Complaint Dental Pain  HPI Stephen [name] is here with complaint of left sided toothache that began yesterday. Patient states he wants morning and had swelling on the left side of his face. He is unaware of any fever or chills. He denies any previous dental problems. Patient states that his face is extremely painful. Currently he rates his pain as a 10 over 10.   Past Medical History:  Diagnosis Date  . Allergy    seasonal  . Anxiety     Patient Active Problem List   Diagnosis Date Noted  . COPD (chronic obstructive pulmonary disease) (HCC) 11/18/2015  . Elevated BP 05/20/2015  . Wheezing on auscultation 01/16/2015  . Anxiety disorder 11/20/2014    History reviewed. No pertinent surgical history.  Prior to Admission medications   Medication Sig Start Date End Date Taking? Authorizing Provider  amoxicillin (AMOXIL) 500 MG capsule Take 1 capsule (500 mg total) by mouth 3 (three) times daily. 03/06/16   Tommi Rumpshonda L Shelba Susi, PA-C  clonazePAM (KLONOPIN) 1 MG tablet Take 1 tablet (1 mg total) by mouth 2 (two) times daily as needed for anxiety. 02/18/16   Ellyn HackSyed Asad A Shah, MD  HYDROcodone-acetaminophen (NORCO/VICODIN) 5-325 MG tablet Take 1 tablet by mouth every 4 (four) hours as needed for moderate pain. 03/06/16   Tommi Rumpshonda L Lalania Haseman, PA-C  ibuprofen (ADVIL,MOTRIN) 800 MG tablet Take 1 tablet (800 mg total) by mouth every 8 (eight) hours as needed. 03/17/15   Joni Reiningonald K Smith, PA-C  SYMBICORT 160-4.5 MCG/ACT inhaler INHALE TWO PUFFS TWICE DAILY 12/02/15   Ellyn HackSyed Asad A Shah, MD  VENTOLIN HFA 108 [phone](90 Base) MCG/ACT inhaler INHALE 2 PUFFS INTO THE LUNGS EVERY 6 HOURS AS NEEDED FOR WHEEZING ORSHORTNESS OF  BREATH 09/21/15   Ellyn HackSyed Asad A Shah, MD    Allergies Patient has no known allergies.  Family History  Problem Relation Age of Onset  . Hypertension Mother   . Hypothyroidism Mother   . Alzheimer's disease Father     Social History Social History  Substance Use Topics  . Smoking status: Current Some Day Smoker    Packs/day: 0.50    Years: 30.00    Types: Cigarettes  . Smokeless tobacco: Never Used  . Alcohol use No    Review of Systems Constitutional: No fever/chills ENT: Positive for dental pain. Cardiovascular: Denies chest pain. Respiratory: Denies shortness of breath. Musculoskeletal: Negative for back pain. Skin: Negative for rash.  10-point ROS otherwise negative.  ____________________________________________   PHYSICAL EXAM:  VITAL SIGNS: ED Triage Vitals  Enc Vitals Group     BP 03/06/16 0721 (!) 179/94     Pulse Rate 03/06/16 0720 (!) 101     Resp 03/06/16 0720 18     Temp 03/06/16 0720 97.9 F (36.6 C)     Temp Source 03/06/16 0720 Oral     SpO2 03/06/16 0720 99 %     Weight 03/06/16 0720 225 lb (102.1 kg)     Height 03/06/16 0720 6' (1.829 m)     Head Circumference --      Peak Flow --      Pain Score 03/06/16 0720 10     Pain Loc --  Pain Edu? --      Excl. in GC? --     Constitutional: Alert and oriented. Well appearing and in no acute distress. Eyes: Conjunctivae are normal. PERRL. EOMI. Head: Atraumatic. Nose: No congestion/rhinnorhea. Mouth/Throat: Mucous membranes are moist.  Oropharynx non-erythematous.Left upper molar moderately tender to palpation with tongue depressor. There is no obvious abscess formation. There is facial swelling in this area. No drainage present. Dental [name] is present. Neck: No stridor.   Hematological/Lymphatic/Immunilogical: No cervical lymphadenopathy. Cardiovascular: Normal rate, regular rhythm. Grossly normal heart sounds.  Good peripheral circulation. Respiratory: Normal respiratory effort.  No  retractions. Lungs CTAB. Gastrointestinal: Soft and nontender. No distention. No abdominal bruits. No CVA tenderness. Musculoskeletal: No lower extremity tenderness nor edema.  No joint effusions. Neurologic:  Normal speech and language. No gross focal neurologic deficits are appreciated. No gait instability. Skin:  Skin is warm, dry and intact. No rash noted. Psychiatric: Mood and affect are normal. Speech and behavior are normal.  ____________________________________________   LABS (all labs ordered are listed, but only abnormal results are displayed)  Labs Reviewed - No data to display  PROCEDURES  Procedure(s) performed: None  Procedures  Critical Care performed: No  ____________________________________________   INITIAL IMPRESSION / ASSESSMENT AND PLAN / ED COURSE  Pertinent labs & imaging results that were available during my care of the patient were reviewed by me and considered in my medical decision making (see chart for details).    Clinical Course    Patient was given Rocephin 1 g IM in the emergency room. He is also given prescription for Norco as needed for severe pain. History of taking amoxicillin on Monday 500 mg 3 times a day for 10 days. He is to call and make an appointment  with a dentist as soon as possible.  ____________________________________________   FINAL CLINICAL IMPRESSION(S) / ED DIAGNOSES  Final diagnoses:  Dental abscess      NEW MEDICATIONS STARTED DURING THIS VISIT:  Discharge Medication List as of 03/06/2016  8:08 AM    START taking these medications   Details  amoxicillin (AMOXIL) 500 MG capsule Take 1 capsule (500 mg total) by mouth 3 (three) times daily., Starting Sun 03/06/2016, Print    HYDROcodone-acetaminophen (NORCO/VICODIN) 5-325 MG tablet Take 1 tablet by mouth every 4 (four) hours as needed for moderate pain., Starting Sun 03/06/2016, Print         Note:  This document was prepared using Dragon voice recognition  software and may include unintentional dictation errors.    Tommi RumpsRhonda L Raynell Upton, PA-C 03/06/16 1528    Minna AntisKevin Paduchowski, MD 03/07/16 2038

## 2016-03-06 NOTE — Discharge Instructions (Signed)
OPTIONS FOR DENTAL FOLLOW UP CARE ° °[place] Department of Health and Human Services - Local Safety Net Dental Clinics °http://www.ncdhhs.gov/dph/oralhealth/services/safetynetclinics.htm °  °[facility] (336-562-3123) ° °[facility] [place] (919-933-9087) ° °[facility] ([phone] ext [phone]) ° °[facility] ([phone]) ° °[facility] ([phone]) °This clinic caters to the indigent population and is on a lottery system. °Location: °[facility], [address], [place] °Clinic Hours: °Wednesdays from 6pm - 9pm, patients seen by a lottery system. °For dates, call or go to www.med.[facility].edu/[facility]/patients/Dental-[facility] °Services: °Cleanings, fillings and simple extractions. °Payment Options: °DENTAL WORK IS FREE OF CHARGE. Bring proof of income or support. °Best way to get seen: °Arrive at 5:15 pm - this is a lottery, NOT first come/first serve, so arriving earlier will not increase your chances of being seen. °  °  °[facility] °[phone] °Select option 1 for emergencies °  °Location: °[facility], [address], [place] °Clinic Hours: °No walk-ins accepted - call the day before to schedule an appointment. °Check in times are 9:30 am and 1:30 pm. °Services: °Simple extractions, temporary fillings, pulpectomy/pulp debridement, uncomplicated abscess drainage. °Payment Options: °PAYMENT IS DUE AT THE TIME OF SERVICE.  Fee is usually $100-200, additional surgical procedures (e.g. abscess drainage) may be extra. °Cash, checks, Visa/MasterCard accepted.  Can file Medicaid if patient is covered for dental - patient should call case worker to check. °No discount for [facility] patients. °Best way to get seen: °MUST call the day before and get onto the schedule. Can usually be seen the next 1-2 days. No walk-ins accepted. °  °  °[facility] °[phone] °   °Location: °[facility], [address], [place] °Clinic Hours: °M, W, Th, F 8am or 1:30pm, Tues 9a or 1:30 - first come/first served. °Services: °Simple extractions, temporary fillings, uncomplicated abscess drainage.  You do not need to be an [place] resident. °Payment Options: °PAYMENT IS DUE AT THE TIME OF SERVICE. °Dental insurance, otherwise sliding scale - bring proof of income or support. °Depending on income and treatment needed, cost is usually $50-200. °Best way to get seen: °Arrive early as it is first come/first served. °  °  °[facility] °[phone] °  °Location: °[address] °Clinic Hours: °Mon-Thu 8a-5p °Services: °Most basic dental services including extractions and fillings. °Payment Options: °PAYMENT IS DUE AT THE TIME OF SERVICE. °Sliding scale, up to 50% off - bring proof if income or support. °Medicaid with dental option accepted. °Best way to get seen: °Call to schedule an appointment, can usually be seen within 2 weeks OR they will try to see walk-ins - show up at 8a or 2p (you may have to wait). °  °  °[facility] °919-245-2435 °[place] RESIDENTS ONLY °  °Location: °[facility], [address], [place],  [address] °Clinic Hours: By appointment only. °Monday - Thursday 8am-5pm, Friday 8am-12pm °Services: Cleanings, fillings, extractions. °Payment Options: °PAYMENT IS DUE AT THE TIME OF SERVICE. °Cash, Visa or MasterCard. Sliding scale - $30 minimum per service. °Best way to get seen: °Come in to office, complete packet and make an appointment - need proof of income °or support monies for each household member and proof of [place] residence. °Usually takes about a month to get in. °  °  °[facility] °[phone] °  °Location: °[address]   [place] °Clinic Hours: Walk-in Urgent Care Dental Services are offered Monday-Friday  mornings only. °The numbers of emergencies accepted daily is limited to the number of °providers available. °Maximum 15 - Mondays, Wednesdays & Thursdays °Maximum 10 - Tuesdays & Fridays °Services: °You do not need to be a [place] resident to be seen for a dental emergency. °Emergencies are defined as pain, swelling, abnormal bleeding, or dental trauma. Walkins will receive x-rays if needed. °NOTE: Dental cleaning is not an emergency. °Payment Options: °PAYMENT IS DUE AT THE TIME OF SERVICE. °Minimum co-pay is $40.00 for uninsured patients. °Minimum co-pay is $3.00 for Medicaid with dental coverage. °Dental Insurance is accepted and must be presented at time of visit. °Medicare does not cover dental. °Forms of payment: Cash, credit card, checks. °Best way to get seen: °If not previously registered with the clinic, walk-in dental registration begins at 7:15 am and is on a first come/first serve basis. °If previously registered with the clinic, call to make an appointment. °  °  °[facility] °[phone] °[place] RESIDENTS ONLY °  °Location: °[address], [place], [place] °Clinic Hours: °Mon-Thu 10a-2p °Services: Extractions only! °Payment Options: °FREE (donations accepted) - bring proof of income or support °Best way to get seen: °Call and schedule an appointment OR come at 8am on the 1st Monday of every month (except for holidays) when it is first come/first served. °  °  °[facility] °[phone] °  °Location: °[address], [place] °Clinic Hours: °Friday mornings °Services, Payment Options, Best way to get seen: °Call for info °

## 2016-03-06 NOTE — ED Triage Notes (Signed)
Left sided toothache beginning yesterday. Hx of similar but to right side.

## 2016-03-16 ENCOUNTER — Ambulatory Visit: Payer: Self-pay | Admitting: Family Medicine

## 2016-05-18 ENCOUNTER — Encounter: Payer: Self-pay | Admitting: Family Medicine

## 2016-05-18 ENCOUNTER — Ambulatory Visit (INDEPENDENT_AMBULATORY_CARE_PROVIDER_SITE_OTHER): Payer: Self-pay | Admitting: Family Medicine

## 2016-05-18 DIAGNOSIS — J449 Chronic obstructive pulmonary disease, unspecified: Secondary | ICD-10-CM

## 2016-05-18 DIAGNOSIS — F411 Generalized anxiety disorder: Secondary | ICD-10-CM

## 2016-05-18 DIAGNOSIS — F17200 Nicotine dependence, unspecified, uncomplicated: Secondary | ICD-10-CM | POA: Insufficient documentation

## 2016-05-18 MED ORDER — BUDESONIDE-FORMOTEROL FUMARATE 160-4.5 MCG/ACT IN AERO: 2.0000 | INHALATION_SPRAY | Freq: Two times a day (BID) | RESPIRATORY_TRACT | 5 refills | Status: DC

## 2016-05-18 MED ORDER — CLONAZEPAM 1 MG PO TABS: 1.0000 mg | ORAL_TABLET | Freq: Two times a day (BID) | ORAL | 2 refills | Status: DC | PRN

## 2016-05-18 NOTE — Progress Notes (Signed)
Name: Chayton Murata   MRN: [id number]    DOB: [birth year]   Date:05/18/2016       Progress Note  Subjective  Chief Complaint  Chief Complaint  Patient presents with  . Medication Refill    Anxiety  Presents for follow-up visit. Symptoms include excessive worry. Patient reports no depressed mood, dizziness, irritability, nervous/anxious behavior or panic. The severity of symptoms is moderate.    COPD: Pt. Has COPD, symptoms include shortness of breath at times, chronic cough, frequent clearing of throat etc. He is taking Ventolin rescue inhaler and Symbicort for maintenance therapy. Seems to be doing well. Still smokes 1 PPD, has no intention of quitting.    Past Medical History:  Diagnosis Date  . Allergy    seasonal  . Anxiety     No past surgical history on file.  Family History  Problem Relation Age of Onset  . Hypertension Mother   . Hypothyroidism Mother   . Alzheimer's disease Father     Social History   Social History  . Marital status: Married    Spouse name: N/A  . Number of children: N/A  . Years of education: N/A   Occupational History  . Not on file.   Social History Main Topics  . Smoking status: Current Some Day Smoker    Packs/day: 0.50    Years: 30.00    Types: Cigarettes  . Smokeless tobacco: Never Used  . Alcohol use No  . Drug use: No  . Sexual activity: Not on file   Other Topics Concern  . Not on file   Social History Narrative  . No narrative on file     Current Outpatient Prescriptions:  .  clonazePAM (KLONOPIN) 1 MG tablet, Take 1 tablet (1 mg total) by mouth 2 (two) times daily as needed for anxiety., Disp: 60 tablet, Rfl: 2 .  ibuprofen (ADVIL,MOTRIN) 800 MG tablet, Take 1 tablet (800 mg total) by mouth every 8 (eight) hours as needed., Disp: 30 tablet, Rfl: 0 .  SYMBICORT 160-4.5 MCG/ACT inhaler, INHALE TWO PUFFS TWICE DAILY, Disp: 10.2 g, Rfl: 5 .  VENTOLIN HFA 108 (90 Base) MCG/ACT inhaler, INHALE 2 PUFFS INTO THE LUNGS  EVERY 6 HOURS AS NEEDED FOR WHEEZING ORSHORTNESS OF BREATH, Disp: 18 g, Rfl: 1  No Known Allergies   Review of Systems  Constitutional: Negative for irritability.  Neurological: Negative for dizziness.  Psychiatric/Behavioral: The patient is not nervous/anxious.     Objective  Vitals:   05/18/16 0908  BP: 128/82  Pulse: 90  Resp: 18  Temp: 98.1 F (36.7 C)  TempSrc: Oral  SpO2: 96%  Weight: 221 lb 4.8 oz (100.4 kg)  Height: 6' (1.829 m)    Physical Exam  Constitutional: He is well-developed, well-nourished, and in no distress.  Cardiovascular: Normal rate, regular rhythm and normal heart sounds.   No murmur heard. Pulmonary/Chest: No respiratory distress. He has no decreased breath sounds. He has wheezes in the right upper field, the right middle field, the left upper field and the left middle field. He has no rhonchi.  Skin: Skin is warm and dry.  Psychiatric: Mood, memory, affect and judgment normal.  Nursing note and vitals reviewed.       Assessment & Plan  1. Generalized anxiety disorder Stable, continue clonazepam as prescribed, patient compliant with controlled substances agreement - clonazePAM (KLONOPIN) 1 MG tablet; Take 1 tablet (1 mg total) by mouth 2 (two) times daily as needed for anxiety.  Dispense: 60 tablet;  Refill: 2  2. Chronic obstructive pulmonary disease, unspecified COPD type (HCC) Continue on Symbicort as prescribed, Ventolin for rescue treatments. - budesonide-formoterol (SYMBICORT) 160-4.5 MCG/ACT inhaler; Inhale 2 puffs into the lungs 2 (two) times daily.  Dispense: 10.2 g; Refill: 5 daily.  Dispense: 10.2 g; Refill: 5  3. Current every day smoker Advised that quitting is the best way to improve his symptoms and functional capacity, he is not ready to quit at this time, we will reassess in 3 month   Marvell Stavola Asad A. Faylene KurtzShah [facility] 05/18/2016 9:20 AM

## 2016-08-17 ENCOUNTER — Encounter: Payer: Self-pay | Admitting: Family Medicine

## 2016-08-22 ENCOUNTER — Encounter: Payer: Self-pay | Admitting: Family Medicine

## 2016-08-22 ENCOUNTER — Ambulatory Visit (INDEPENDENT_AMBULATORY_CARE_PROVIDER_SITE_OTHER): Payer: Self-pay | Admitting: Family Medicine

## 2016-08-22 VITALS — BP 130/78 | HR 84 | Temp 98.3°F | Resp 17 | Ht 72.0 in | Wt 222.4 lb

## 2016-08-22 DIAGNOSIS — J449 Chronic obstructive pulmonary disease, unspecified: Secondary | ICD-10-CM

## 2016-08-22 DIAGNOSIS — F411 Generalized anxiety disorder: Secondary | ICD-10-CM

## 2016-08-22 MED ORDER — CLONAZEPAM 1 MG PO TABS: 1.0000 mg | ORAL_TABLET | Freq: Every day | ORAL | 2 refills | Status: DC | PRN

## 2016-08-22 NOTE — Progress Notes (Signed)
Name: Stephen [name]   MRN: [id number]    DOB: [birth year]   Date:08/22/2016       Progress Note  Subjective  Chief Complaint  Chief Complaint  Patient presents with  . Medication Refill    Anxiety  Presents for follow-up visit. Symptoms include excessive worry. Patient reports no depressed mood, dizziness, irritability, nervous/anxious behavior or panic. The severity of symptoms is moderate. The quality of sleep is good.    COPD:  Taking Symbicort as prescribed, feels like its not helping to that extent as before, has occasional coughing no dyspnea. He takes Ventolin as needed (once or twice a week) especially when doing strenuous activity such as lifting heavy bags of groceries and taking them upstairs   Past Medical History:  Diagnosis Date  . Allergy    seasonal  . Anxiety     History reviewed. No pertinent surgical history.  Family History  Problem Relation Age of Onset  . Hypertension Mother   . Hypothyroidism Mother   . Alzheimer's disease Father     Social History   Social History  . Marital status: Married    Spouse name: N/A  . Number of children: N/A  . Years of education: N/A   Occupational History  . Not on file.   Social History Main Topics  . Smoking status: Current Some Day Smoker    Packs/day: 0.50    Years: 30.00    Types: Cigarettes  . Smokeless tobacco: Never Used  . Alcohol use No  . Drug use: No  . Sexual activity: Not on file   Other Topics Concern  . Not on file   Social History Narrative  . No narrative on file     Current Outpatient Prescriptions:  .  budesonide-formoterol (SYMBICORT) 160-4.5 MCG/ACT inhaler, Inhale 2 puffs into the lungs 2 (two) times daily., Disp: 10.2 g, Rfl: 5 .  clonazePAM (KLONOPIN) 1 MG tablet, Take 1 tablet (1 mg total) by mouth 2 (two) times daily as needed for anxiety., Disp: 60 tablet, Rfl: 2 .  ibuprofen (ADVIL,MOTRIN) 800 MG tablet, Take 1 tablet (800 mg total) by mouth every 8 (eight) hours as  needed., Disp: 30 tablet, Rfl: 0 .  VENTOLIN HFA 108 (90 Base) MCG/ACT inhaler, INHALE 2 PUFFS INTO THE LUNGS EVERY 6 HOURS AS NEEDED FOR WHEEZING ORSHORTNESS OF BREATH, Disp: 18 g, Rfl: 1  No Known Allergies   Review of Systems  Constitutional: Negative for irritability.  Neurological: Negative for dizziness.  Psychiatric/Behavioral: The patient is not nervous/anxious.      Objective  Vitals:   08/22/16 0851  BP: 130/78  Pulse: 84  Resp: 17  Temp: 98.3 F (36.8 C)  TempSrc: Oral  SpO2: 96%  Weight: 222 lb 6.4 oz (100.9 kg)  Height: 6' (1.829 m)    Physical Exam  Constitutional: He is well-developed, well-nourished, and in no distress.  HENT:  Head: Normocephalic and atraumatic.  Cardiovascular: Normal rate, regular rhythm and normal heart sounds.   No murmur heard. Pulmonary/Chest: No respiratory distress. He has wheezes in the right lower field and the left middle field.  Psychiatric: Mood, memory, affect and judgment normal.  Nursing note and vitals reviewed.    Assessment & Plan  1. Generalized anxiety disorder Stable and improving, decrease clonazepam to 1 a day as needed, refills provided - clonazePAM (KLONOPIN) 1 MG tablet; Take 1 tablet (1 mg total) by mouth daily as needed for anxiety.  Dispense: 30 tablet; Refill: 2  2. Chronic obstructive  pulmonary disease, unspecified COPD type (HCC) Continue on Symbicort and Ventolin for now, reassess in 3 months and may consider changing the ICS-LABA   Dianna Deshler Asad A. Faylene KurtzShah [facility] 08/22/2016 9:16 AM

## 2016-11-25 ENCOUNTER — Encounter: Payer: Self-pay | Admitting: Family Medicine

## 2016-12-02 ENCOUNTER — Other Ambulatory Visit: Payer: Self-pay | Admitting: Family Medicine

## 2016-12-02 DIAGNOSIS — J449 Chronic obstructive pulmonary disease, unspecified: Secondary | ICD-10-CM

## 2017-01-23 ENCOUNTER — Ambulatory Visit (INDEPENDENT_AMBULATORY_CARE_PROVIDER_SITE_OTHER): Payer: Self-pay | Admitting: Family Medicine

## 2017-01-23 ENCOUNTER — Encounter: Payer: Self-pay | Admitting: Family Medicine

## 2017-01-23 VITALS — BP 130/76 | HR 91 | Temp 97.9°F | Resp 16 | Ht 72.0 in | Wt 228.0 lb

## 2017-01-23 DIAGNOSIS — F411 Generalized anxiety disorder: Secondary | ICD-10-CM

## 2017-01-23 DIAGNOSIS — J449 Chronic obstructive pulmonary disease, unspecified: Secondary | ICD-10-CM

## 2017-01-23 MED ORDER — CLONAZEPAM 1 MG PO TABS: 1.0000 mg | ORAL_TABLET | Freq: Every day | ORAL | 2 refills | Status: DC | PRN

## 2017-01-23 NOTE — Progress Notes (Signed)
Name: Stephen [name]   MRN: [id number]    DOB: 12/01/1955   Date:01/23/2017       Progress Note  Subjective  Chief Complaint  Chief Complaint  Patient presents with  . Medication Refill    Anxiety  Presents for follow-up visit. Symptoms include excessive worry, insomnia, irritability and nervous/anxious behavior. Patient reports no depressed mood, panic or shortness of breath. The severity of symptoms is moderate and causing significant distress. The quality of sleep is fair.    He is also concerned that his maintenance inhaler Symbicort is no longer effective, gets shortness of breath with exertion, has COPD and still continues to smoke, Would like to try something different.    Past Medical History:  Diagnosis Date  . Allergy    seasonal  . Anxiety     History reviewed. No pertinent surgical history.  Family History  Problem Relation Age of Onset  . Hypertension Mother   . Hypothyroidism Mother   . Alzheimer's disease Father     Social History   Social History  . Marital status: Married    Spouse name: N/A  . Number of children: N/A  . Years of education: N/A   Occupational History  . Not on file.   Social History Main Topics  . Smoking status: Current Every Day Smoker    Packs/day: 0.50    Years: 30.00    Types: Cigarettes  . Smokeless tobacco: Never Used  . Alcohol use No  . Drug use: No  . Sexual activity: Not on file   Other Topics Concern  . Not on file   Social History Narrative  . No narrative on file     Current Outpatient Prescriptions:  .  clonazePAM (KLONOPIN) 1 MG tablet, Take 1 tablet (1 mg total) by mouth daily as needed for anxiety., Disp: 30 tablet, Rfl: 2 .  ibuprofen (ADVIL,MOTRIN) 800 MG tablet, Take 1 tablet (800 mg total) by mouth every 8 (eight) hours as needed., Disp: 30 tablet, Rfl: 0 .  SYMBICORT 160-4.5 MCG/ACT inhaler, INHALE 2 PUFFS INTO THE LUNGS TWICE DAILY, Disp: 10.2 g, Rfl: 2 .  VENTOLIN HFA 108 (90 Base) MCG/ACT  inhaler, INHALE 2 PUFFS INTO THE LUNGS EVERY 6 HOURS AS NEEDED FOR WHEEZING ORSHORTNESS OF BREATH, Disp: 18 g, Rfl: 1  No Known Allergies   Review of Systems  Constitutional: Positive for irritability.  Respiratory: Negative for shortness of breath.   Psychiatric/Behavioral: The patient is nervous/anxious and has insomnia.      Objective  Vitals:   01/23/17 1337  BP: 130/76  Pulse: 91  Resp: 16  Temp: 97.9 F (36.6 C)  TempSrc: Oral  SpO2: 96%  Weight: 228 lb (103.4 kg)  Height: 6' (1.829 m)    Physical Exam  Constitutional: He is oriented to person, place, and time and well-developed, well-nourished, and in no distress.  HENT:  Head: Normocephalic and atraumatic.  Cardiovascular: Normal rate, regular rhythm and normal heart sounds.   No murmur heard. Pulmonary/Chest: Effort normal and breath sounds normal. He has no wheezes.  Neurological: He is alert and oriented to person, place, and time.  Psychiatric: Mood, memory, affect and judgment normal.  Nursing note and vitals reviewed.       Assessment & Plan  1. Generalized anxiety disorder Stable, continue on Clonazepam, advised that he will have to be referred to Psychiatry after my departure from the practice, pt. Verbalized agreement. - clonazePAM (KLONOPIN) 1 MG tablet; Take 1 tablet (1 mg total)  by mouth daily as needed for anxiety.  Dispense: 30 tablet; Refill: 2  2. Chronic obstructive pulmonary disease, unspecified COPD type (HCC) Provided savings card for Ernestina PatchesAnoro Ellipt[employer], he will check with pharmacy about the cost and will follow up.  Laguana Desautel Asad A. Faylene KurtzShah [facility] 01/23/2017 1:47 PM

## 2017-01-23 NOTE — Progress Notes (Signed)
The following letter was provided to [facility] during their visit today: ---------------------------------------  Dear valued [facility] Patient,  I am writing to share that as of May 19, 2017, I will no longer be seeing patients at [facility]. While it has been my privilege to care for you as a physician, I have decided to move outside of [place] to pursue other opportunities.  The staff at [facility] has been supportive of my decision and are supportive of any patients who have been under my care.  They will be happy to provide care to you and your family.  The office staff will do everything they can to ensure a seamless transition of care at [facility].  However if you are on any controlled substance medications (i.e. Pain medication or benzodiazepines), they will no longer be able to refill those medications, but we will be more than happy to refer you to a specialist.  [facility] will also assist you with the transfer of medical records should you wish to seek care elsewhere.  If you have any questions about your future care, you may call the office at [phone].  I have enjoyed getting to know my patients here and I wish you the very best.  Sincerely,  Velta AddisonSyed Asad Shah, MD  ---------------------------------------  A written copy of this letter was given to the patient.  The patient verbalizes understanding of the letter, and does request referral to specialist or to new primary care provider.  This decision has been conveyed to Dr. Sherryll BurgerShah, who will place any appropriate referrals during today's visit.

## 2017-02-27 ENCOUNTER — Encounter: Payer: Self-pay | Admitting: Family Medicine

## 2017-03-02 ENCOUNTER — Other Ambulatory Visit: Payer: Self-pay | Admitting: Family Medicine

## 2017-03-02 DIAGNOSIS — J449 Chronic obstructive pulmonary disease, unspecified: Secondary | ICD-10-CM

## 2017-03-21 ENCOUNTER — Ambulatory Visit: Payer: Self-pay | Admitting: Family Medicine

## 2017-04-17 ENCOUNTER — Ambulatory Visit (INDEPENDENT_AMBULATORY_CARE_PROVIDER_SITE_OTHER): Payer: Self-pay | Admitting: Family Medicine

## 2017-04-17 ENCOUNTER — Encounter: Payer: Self-pay | Admitting: Family Medicine

## 2017-04-17 DIAGNOSIS — J449 Chronic obstructive pulmonary disease, unspecified: Secondary | ICD-10-CM

## 2017-04-17 DIAGNOSIS — F411 Generalized anxiety disorder: Secondary | ICD-10-CM

## 2017-04-17 MED ORDER — CLONAZEPAM 1 MG PO TABS: 1.0000 mg | ORAL_TABLET | Freq: Every day | ORAL | 2 refills | Status: DC | PRN

## 2017-04-17 MED ORDER — BUDESONIDE-FORMOTEROL FUMARATE 160-4.5 MCG/ACT IN AERO: 2.0000 | INHALATION_SPRAY | Freq: Two times a day (BID) | RESPIRATORY_TRACT | 2 refills | Status: DC

## 2017-04-17 MED ORDER — ALBUTEROL SULFATE HFA 108 (90 BASE) MCG/ACT IN AERS: INHALATION_SPRAY | RESPIRATORY_TRACT | 2 refills | Status: DC

## 2017-04-17 NOTE — Progress Notes (Signed)
Name: Stephen PippinsJohn [name]   MRN: [id number]    DOB: [birth year]   Date:04/17/2017       Progress Note  Subjective  Chief Complaint  Chief Complaint  Patient presents with  . Medication Refill    Anxiety  Presents for follow-up visit. Symptoms include excessive worry, insomnia, irritability and nervous/anxious behavior. Patient reports no compulsions or depressed mood. The severity of symptoms is moderate and causing significant distress.    COPD: Pt. is here for follow up of COPD, he has dyspnea on exertion, espeically when handling groceries upstairs, uses rescue inhaler every once in a while (once a week) and Symbicort every day. He still continues to smoke about 1.5 PPD.    Past Medical History:  Diagnosis Date  . Allergy    seasonal  . Anxiety     No past surgical history on file.  Family History  Problem Relation Age of Onset  . Hypertension Mother   . Hypothyroidism Mother   . Alzheimer's disease Father     Social History   Socioeconomic History  . Marital status: Married    Spouse name: Not on file  . Number of children: Not on file  . Years of education: Not on file  . Highest education level: Not on file  Social Needs  . Financial resource strain: Not on file  . Food insecurity - worry: Not on file  . Food insecurity - inability: Not on file  . Transportation needs - medical: Not on file  . Transportation needs - non-medical: Not on file  Occupational History  . Not on file  Tobacco Use  . Smoking status: Current Every Day Smoker    Packs/day: 0.50    Years: 30.00    Pack years: 15.00    Types: Cigarettes  . Smokeless tobacco: Never Used  Substance and Sexual Activity  . Alcohol use: No    Alcohol/week: 0.0 oz  . Drug use: No  . Sexual activity: Not Currently  Other Topics Concern  . Not on file  Social History Narrative  . Not on file     Current Outpatient Medications:  .  clonazePAM (KLONOPIN) 1 MG tablet, Take 1 tablet (1 mg total) by mouth  daily as needed for anxiety., Disp: 30 tablet, Rfl: 2 .  ibuprofen (ADVIL,MOTRIN) 800 MG tablet, Take 1 tablet (800 mg total) by mouth every 8 (eight) hours as needed., Disp: 30 tablet, Rfl: 0 .  SYMBICORT 160-4.5 MCG/ACT inhaler, INHALE 2 PUFFS INTO THE LUNGS TWICE DAILY, Disp: 10.2 g, Rfl: 2 .  VENTOLIN HFA 108 (90 Base) MCG/ACT inhaler, INHALE 2 PUFFS INTO THE LUNGS EVERY 6 HOURS AS NEEDED FOR WHEEZING ORSHORTNESS OF BREATH, Disp: 18 g, Rfl: 1  No Known Allergies   Review of Systems  Constitutional: Positive for irritability.  Psychiatric/Behavioral: The patient is nervous/anxious and has insomnia.     Objective  Vitals:   04/17/17 1337  BP: 126/74  Pulse: 82  Resp: 18  Temp: 98.1 F (36.7 C)  TempSrc: Oral  SpO2: 95%  Weight: 223 lb 6.4 oz (101.3 kg)  Height: 6' (1.829 m)    Physical Exam  Constitutional: He is oriented to person, place, and time and well-developed, well-nourished, and in no distress.  HENT:  Head: Normocephalic and atraumatic.  Cardiovascular: Normal rate, regular rhythm, S1 normal, S2 normal and normal heart sounds.  No murmur heard. Pulmonary/Chest: Effort normal. No respiratory distress. He has wheezes in the right middle field, the right lower field, the  left middle field and the left lower field.  Neurological: He is alert and oriented to person, place, and time. GCS score is 15.  Psychiatric: Mood, memory, affect and judgment normal.  Nursing note and vitals reviewed.     Assessment & Plan  1. Chronic obstructive pulmonary disease, unspecified COPD type (HCC) Once again strongly encouraged patient to stop smoking, we will recommend Breo or Trelegy inhalers but they are too expensive for the patient for now. He will continue on Symbicort, follow-up with next PCP - budesonide-formoterol (SYMBICORT) 160-4.5 MCG/ACT inhaler; Inhale 2 puffs into the lungs 2 (two) times daily.  Dispense: 10.2 g; Refill: 2 - albuterol (VENTOLIN HFA) 108 (90 Base)  MCG/ACT inhaler; INHALE 2 PUFFS INTO THE LUNGS EVERY 6 HOURS AS NEEDED FOR WHEEZING ORSHORTNESS OF BREATH  Dispense: 18 g; Refill: 2  2. Generalized anxiety disorder Taking clonazepam for anxiety and insomnia, seems to be working well, no adverse effects, compliant with controlled substances agreement. Refills provided - clonazePAM (KLONOPIN) 1 MG tablet; Take 1 tablet (1 mg total) by mouth daily as needed for anxiety.  Dispense: 30 tablet; Refill: 2   Kirk Sampley Asad A. [facility] 04/17/2017 1:42 PM

## 2017-09-25 ENCOUNTER — Telehealth: Payer: Self-pay

## 2017-09-25 ENCOUNTER — Telehealth: Payer: Self-pay | Admitting: Family Medicine

## 2017-09-25 DIAGNOSIS — J449 Chronic obstructive pulmonary disease, unspecified: Secondary | ICD-10-CM

## 2017-09-25 MED ORDER — BUDESONIDE-FORMOTEROL FUMARATE 160-4.5 MCG/ACT IN AERO
2.0000 | INHALATION_SPRAY | Freq: Two times a day (BID) | RESPIRATORY_TRACT | 0 refills | Status: DC
Start: 2017-09-25 — End: 2018-09-17

## 2017-09-25 NOTE — Telephone Encounter (Signed)
Please contact patient; ask him to schedule a physical with me in the next month We'll discuss preventive care, vaccines, get labs, etc. Thank you

## 2017-09-25 NOTE — Telephone Encounter (Signed)
I see in the note that he is leaving our practice One refill given Please document ending of patient-physician relationship for chart Thank you

## 2017-09-25 NOTE — Telephone Encounter (Signed)
Cancel that request; there is another note that says he is going to find another doctor Note sent to [employer] to formally document cessation of patient-physician relatioship

## 2017-09-25 NOTE — Telephone Encounter (Signed)
Copied from CRM #[id number]. Topic: Quick Communication - Rx Refill/Question >> Sep 25, 2017  9:14 AM Arlyss Gandyichardson, Taren N, NT wrote: Medication: budesonide-formoterol (SYMBICORT) 160-4.5 MCG/ACT inhaler  Has the patient contacted their pharmacy? Yes.   (Agent: If no, request that the patient contact the pharmacy for the refill.) (Agent: If yes, when and what did the pharmacy advise?)  Preferred Pharmacy (with phone number or street name): [facility] - [place] - [address] [phone] (Phone) [phone] (Fax)      Agent: Please be advised that RX refills may take up to 3 business days. We ask that you follow-up with your pharmacy.

## 2017-09-25 NOTE — Telephone Encounter (Signed)
Copied from CRM #[id number]. Topic: Quick Communication - Rx Refill/Question >> Sep 25, 2017  9:14 AM Arlyss Gandyichardson, Vonne Mcdanel N, NT wrote: Medication: budesonide-formoterol (SYMBICORT) 160-4.5 MCG/ACT inhaler  Has the patient contacted their pharmacy? Yes.   (Agent: If no, request that the patient contact the pharmacy for the refill.) (Agent: If yes, when and what did the pharmacy advise?)  Preferred Pharmacy (with phone number or street name): [facility] - [place] - [address] [phone] (Phone) [phone] (Fax)   Patient states he is out of the medication and would like to see if he can have at least 1 refill until he decides who his new PCP will be. (Does not want a male physician)     Agent: Please be advised that RX refills may take up to 3 business days. We ask that you follow-up with your pharmacy.

## 2017-09-26 NOTE — Telephone Encounter (Signed)
Complete

## 2018-03-15 ENCOUNTER — Emergency Department: Payer: Self-pay

## 2018-03-15 ENCOUNTER — Other Ambulatory Visit: Payer: Self-pay

## 2018-03-15 ENCOUNTER — Emergency Department
Admission: EM | Admit: 2018-03-15 | Discharge: 2018-03-15 | Disposition: A | Discharge status: Home / Self Care | Payer: Self-pay | Attending: Emergency Medicine | Admitting: Emergency Medicine

## 2018-03-15 DIAGNOSIS — J449 Chronic obstructive pulmonary disease, unspecified: Secondary | ICD-10-CM | POA: Insufficient documentation

## 2018-03-15 DIAGNOSIS — Z79899 Other long term (current) drug therapy: Secondary | ICD-10-CM | POA: Insufficient documentation

## 2018-03-15 DIAGNOSIS — J441 Chronic obstructive pulmonary disease with (acute) exacerbation: Secondary | ICD-10-CM

## 2018-03-15 DIAGNOSIS — F17210 Nicotine dependence, cigarettes, uncomplicated: Secondary | ICD-10-CM | POA: Insufficient documentation

## 2018-03-15 DIAGNOSIS — J189 Pneumonia, unspecified organism: Secondary | ICD-10-CM | POA: Insufficient documentation

## 2018-03-15 LAB — BASIC METABOLIC PANEL
Anion gap: 8 (ref 5–15)
BUN: 13 mg/dL (ref 8–23)
CHLORIDE: 103 mmol/L (ref 98–111)
CO2: 25 mmol/L (ref 22–32)
Calcium: 8.7 mg/dL — ABNORMAL LOW (ref 8.9–10.3)
Creatinine, Ser: 0.71 mg/dL (ref 0.61–1.24)
GFR calc non Af Amer: >60 mL/min (ref >60)
GLUCOSE: 152 mg/dL — AB (ref 70–99)
Potassium: 4.2 mmol/L (ref 3.5–5.1)
SODIUM: 136 mmol/L (ref 135–145)

## 2018-03-15 LAB — CBC WITH DIFFERENTIAL/PLATELET
ABS IMMATURE GRANULOCYTES: 0.05 10*3/uL (ref 0.00–0.07)
BASOS ABS: 0 10*3/uL (ref 0.0–0.1)
BASOS PCT: 1 %
EOS ABS: 0.1 10*3/uL (ref 0.0–0.5)
Eosinophils Relative: 2 %
HCT: 45.9 % (ref 39.0–52.0)
Hemoglobin: 15.3 g/dL (ref 13.0–17.0)
IMMATURE GRANULOCYTES: 1 %
Lymphocytes Relative: 17 %
Lymphs Abs: 0.9 10*3/uL (ref 0.7–4.0)
MCH: 32.4 pg (ref 26.0–34.0)
MCHC: 33.3 g/dL (ref 30.0–36.0)
MCV: 97.2 fL (ref 80.0–100.0)
MONO ABS: 0.6 10*3/uL (ref 0.1–1.0)
MONOS PCT: 11 %
Neutro Abs: 3.6 10*3/uL (ref 1.7–7.7)
Neutrophils Relative %: 68 %
PLATELETS: 233 10*3/uL (ref 150–400)
RBC: 4.72 MIL/uL (ref 4.22–5.81)
RDW: 12.2 % (ref 11.5–15.5)
WBC: 5.3 10*3/uL (ref 4.0–10.5)
nRBC: 0 % (ref 0.0–0.2)

## 2018-03-15 LAB — TROPONIN I

## 2018-03-15 MED ORDER — IPRATROPIUM-ALBUTEROL 0.5-2.5 (3) MG/3ML IN SOLN
RESPIRATORY_TRACT | Status: AC
  Administered 2018-03-15: 3 mL via RESPIRATORY_TRACT
  Filled 2018-03-15: qty 3

## 2018-03-15 MED ORDER — PREDNISONE 50 MG PO TABS: ORAL_TABLET | ORAL | 0 refills | Status: DC

## 2018-03-15 MED ORDER — IPRATROPIUM-ALBUTEROL 0.5-2.5 (3) MG/3ML IN SOLN
6.0000 mL | Freq: Once | RESPIRATORY_TRACT | Status: AC
  Administered 2018-03-15: 6 mL via RESPIRATORY_TRACT
  Filled 2018-03-15: qty 6

## 2018-03-15 MED ORDER — IPRATROPIUM-ALBUTEROL 0.5-2.5 (3) MG/3ML IN SOLN
3.0000 mL | Freq: Once | RESPIRATORY_TRACT | Status: AC
  Administered 2018-03-15: 3 mL via RESPIRATORY_TRACT

## 2018-03-15 MED ORDER — DOXYCYCLINE HYCLATE 100 MG PO TABS
100.0000 mg | ORAL_TABLET | Freq: Once | ORAL | Status: AC
  Administered 2018-03-15: 100 mg via ORAL
  Filled 2018-03-15: qty 1

## 2018-03-15 MED ORDER — DOXYCYCLINE HYCLATE 100 MG PO CAPS: 100.0000 mg | ORAL_CAPSULE | Freq: Two times a day (BID) | ORAL | 0 refills | Status: DC

## 2018-03-15 MED ORDER — METHYLPREDNISOLONE SODIUM SUCC 125 MG IJ SOLR
125.0000 mg | Freq: Once | INTRAMUSCULAR | Status: AC
Start: 2018-03-15 — End: 2018-03-15
  Administered 2018-03-15: 125 mg via INTRAVENOUS
  Filled 2018-03-15: qty 2

## 2018-03-15 NOTE — ED Provider Notes (Signed)
[facility] Emergency Department Provider Note  ___________________________________________   First MD Initiated Contact with Patient 03/15/18 0701     (approximate)  I have reviewed the triage vital signs and the nursing notes.   HISTORY  Chief Complaint Shortness of Breath   HPI Stephen [name] is a 62 y.o. male who is a 1-1/2 pack/day smoker who is presenting to the emergency department with shortness of breath over the past 2 to 3 days.  The patient says that he is unable to sleep at night.  Associated with worsening when patient lays back.  Also with a cough productive of clear sputum.  Denies any chest pain at this time but says that he has had intermittent pain with coughing across the front of his chest.  Denies any history of heart disease.  Says that he has wheezed in the past and has been prescribed an inhaled steroid but no longer takes it secondary to it being cost prohibitive.  Says that he has a Ventolin inhaler which she has been using intermittently with only minimal relief.   Past Medical History:  Diagnosis Date  . Allergy    seasonal  . Anxiety     Patient Active Problem List   Diagnosis Date Noted  . Current every day smoker 05/18/2016  . COPD (chronic obstructive pulmonary disease) (HCC) 11/18/2015  . Elevated BP 05/20/2015  . Wheezing on auscultation 01/16/2015  . Anxiety disorder 11/20/2014    No past surgical history on file.  Prior to Admission medications   Medication Sig Start Date End Date Taking? Authorizing Provider  ibuprofen (ADVIL,MOTRIN) 800 MG tablet Take 1 tablet (800 mg total) by mouth every 8 (eight) hours as needed. 03/17/15  Yes Joni ReiningSmith, Stephen K, PA-C  albuterol (VENTOLIN HFA) 108 (90 Base) MCG/ACT inhaler INHALE 2 PUFFS INTO THE LUNGS EVERY 6 HOURS AS NEEDED FOR WHEEZING ORSHORTNESS OF BREATH 04/17/17   Ellyn HackShah, Syed Asad A, MD  budesonide-formoterol [facility](SYMBICORT) 160-4.5 MCG/ACT inhaler Inhale 2 puffs into the lungs  2 (two) times daily. Patient not taking: Reported on 03/15/2018 09/25/17   Kerman PasseyLada, Melinda P, MD  clonazePAM (KLONOPIN) 1 MG tablet Take 1 tablet (1 mg total) by mouth daily as needed for anxiety. 04/17/17 07/16/17  Ellyn HackShah, Syed Asad A, MD    Allergies Patient has no known allergies.  Family History  Problem Relation Age of Onset  . Hypertension Mother   . Hypothyroidism Mother   . Alzheimer's disease Father     Social History Social History   Tobacco Use  . Smoking status: Current Every Day Smoker    Packs/day: 0.50    Years: 30.00    Pack years: 15.00    Types: Cigarettes  . Smokeless tobacco: Never Used  Substance Use Topics  . Alcohol use: No    Alcohol/week: 0.0 standard drinks  . Drug use: No    Review of Systems  Constitutional: No fever/chills Eyes: No visual changes. ENT: No sore throat. Cardiovascular: As above Respiratory: As above Gastrointestinal: No abdominal pain.  No nausea, no vomiting.  No diarrhea.  No constipation. Genitourinary: Negative for dysuria. Musculoskeletal: Negative for back pain. Skin: Negative for rash. Neurological: Negative for headaches, focal weakness or numbness.   ____________________________________________   PHYSICAL EXAM:  VITAL SIGNS: ED Triage Vitals  Enc Vitals Group     BP 03/15/18 0646 (!) 175/88     Pulse Rate 03/15/18 0646 79     Resp 03/15/18 0646 (!) 24  Temp 03/15/18 0646 97.6 F (36.4 C)     Temp Source 03/15/18 0646 Oral     SpO2 03/15/18 0646 96 %     Weight 03/15/18 0647 210 lb (95.3 kg)     Height 03/15/18 0647 6' (1.829 m)     Head Circumference --      Peak Flow --      Pain Score 03/15/18 0657 0     Pain Loc --      Pain Edu? --      Excl. in GC? --     Constitutional: Alert and oriented.  in no acute distress. Eyes: Conjunctivae are normal.  Head: Atraumatic. Nose: No congestion/rhinnorhea. Mouth/Throat: Mucous membranes are moist.  Neck: No stridor.   Cardiovascular: Normal rate,  regular rhythm. Grossly normal heart sounds.   Respiratory: Mildly tachypneic with mildly labored respirations but able speak in full sentences.  Generalized wheezing with prolonged expiratory phase.  No respiratory distress.  No use of accessory muscles or retractions. Gastrointestinal: Soft and nontender. No distention.  Musculoskeletal: No lower extremity tenderness nor edema.  No joint effusions. Neurologic:  Normal speech and language. No gross focal neurologic deficits are appreciated. Skin:  Skin is warm, dry and intact. No rash noted. Psychiatric: Mood and affect are normal. Speech and behavior are normal.  ____________________________________________   LABS (all labs ordered are listed, but only abnormal results are displayed)  Labs Reviewed  BASIC METABOLIC PANEL - Abnormal; Notable for the following components:      Result Value   Glucose, Bld 152 (*)    Calcium 8.7 (*)    All other components within normal limits  CBC WITH DIFFERENTIAL/PLATELET  TROPONIN I   ____________________________________________  EKG  ED ECG REPORT I, Arelia Longest, the attending physician, personally viewed and interpreted this ECG.   Date: 03/15/2018  EKG Time: 0656  Rate: 76  Rhythm: normal sinus rhythm  Axis: Normal  Intervals:Short PR  ST&T Change: No ST segment elevation or depression.  No abnormal T wave inversions.  ____________________________________________  RADIOLOGY  Pleural-based density noted over the right mid lateral chest.  Follow-up PA lateral chest x-ray suggested.  Peribronchial cuffing suggesting bronchitis. ____________________________________________   PROCEDURES  Procedure(s) performed:   Procedures  Critical Care performed:   ____________________________________________   INITIAL IMPRESSION / ASSESSMENT AND PLAN / ED COURSE  Pertinent labs & imaging results that were available during my care of the patient were reviewed by me and considered in  my medical decision making (see chart for details).  Differential includes, but is not limited to, viral syndrome, bronchitis including COPD exacerbation, pneumonia, reactive airway disease including asthma, CHF including exacerbation with or without pulmonary/interstitial edema, pneumothorax, ACS, thoracic trauma, and pulmonary embolism. As part of my medical decision making, I reviewed the following data within the electronic MEDICAL RECORD NUMBER Notes from prior ED visits   ----------------------------------------- 9:58 AM on 03/15/2018 -----------------------------------------  Patient states that he feels improved at this time.  Auscultated his lungs and he is still with diffuse wheezing but with improved air movement.  Respiratory rate of 18 at this time.  Patient able to ambulate throughout the department without respiratory distress with an oxygen saturation at its lowest of 92% on room air.  Patient to be discharged at this time.  He is aware of the chest x-ray finding the need for follow-up imaging.  He is understanding the diagnosis as well as treatment plan and willing to comply.  Patient likely with COPD  exacerbation.  Chest pain-free throughout the emergency department with a normal heart rate.  Unlikely to be PE or ACS. ____________________________________________   FINAL CLINICAL IMPRESSION(S) / ED DIAGNOSES  Community-acquired pneumonia.  COPD exacerbation.   NEW MEDICATIONS STARTED DURING THIS VISIT:  New Prescriptions   No medications on file     Note:  This document was prepared using Dragon voice recognition software and may include unintentional dictation errors.     Myrna Blazer, MD 03/15/18 [phone]

## 2018-03-15 NOTE — ED Notes (Signed)
Pt transported to XR.  

## 2018-03-15 NOTE — ED Triage Notes (Signed)
Pt in with co shob for 2 days states unable to sleep tonight due to symptoms.

## 2018-03-15 NOTE — ED Notes (Signed)
Pt c/o feeling jittery and nervous after receiving solu-medrol, discussed that as a possible side effect, pt states he is ready to go home, informed Dr. Pershing ProudSchaevitz.

## 2018-03-15 NOTE — ED Notes (Signed)
Returned from XR 

## 2018-03-15 NOTE — ED Notes (Addendum)
Per EDT Joni ReiningNicole that walked the patient pt was consistently at 95-96% on room air while ambulating in the hall.   Dr. Pershing ProudSchaevitz aware.

## 2018-05-18 ENCOUNTER — Other Ambulatory Visit: Payer: Self-pay

## 2018-05-18 ENCOUNTER — Emergency Department: Payer: Self-pay

## 2018-05-18 ENCOUNTER — Emergency Department
Admission: EM | Admit: 2018-05-18 | Discharge: 2018-05-18 | Disposition: A | Discharge status: Home / Self Care | Payer: Self-pay | Attending: Emergency Medicine | Admitting: Emergency Medicine

## 2018-05-18 DIAGNOSIS — J449 Chronic obstructive pulmonary disease, unspecified: Secondary | ICD-10-CM | POA: Insufficient documentation

## 2018-05-18 DIAGNOSIS — F17210 Nicotine dependence, cigarettes, uncomplicated: Secondary | ICD-10-CM | POA: Insufficient documentation

## 2018-05-18 DIAGNOSIS — J40 Bronchitis, not specified as acute or chronic: Secondary | ICD-10-CM | POA: Insufficient documentation

## 2018-05-18 LAB — CBC WITH DIFFERENTIAL/PLATELET
ABS IMMATURE GRANULOCYTES: 0.01 10*3/uL (ref 0.00–0.07)
Basophils Absolute: 0.1 10*3/uL (ref 0.0–0.1)
Basophils Relative: 1 %
Eosinophils Absolute: 0.2 10*3/uL (ref 0.0–0.5)
Eosinophils Relative: 2 %
HCT: 43.6 % (ref 39.0–52.0)
Hemoglobin: 14.7 g/dL (ref 13.0–17.0)
Immature Granulocytes: 0 %
LYMPHS ABS: 1.3 10*3/uL (ref 0.7–4.0)
Lymphocytes Relative: 19 %
MCH: 31.9 pg (ref 26.0–34.0)
MCHC: 33.7 g/dL (ref 30.0–36.0)
MCV: 94.6 fL (ref 80.0–100.0)
Monocytes Absolute: 0.6 10*3/uL (ref 0.1–1.0)
Monocytes Relative: 10 %
Neutro Abs: 4.5 10*3/uL (ref 1.7–7.7)
Neutrophils Relative %: 68 %
Platelets: 245 10*3/uL (ref 150–400)
RBC: 4.61 MIL/uL (ref 4.22–5.81)
RDW: 12.7 % (ref 11.5–15.5)
WBC: 6.6 10*3/uL (ref 4.0–10.5)
nRBC: 0 % (ref 0.0–0.2)

## 2018-05-18 LAB — BASIC METABOLIC PANEL
Anion gap: 6 (ref 5–15)
BUN: 10 mg/dL (ref 8–23)
CO2: 28 mmol/L (ref 22–32)
Calcium: 9 mg/dL (ref 8.9–10.3)
Chloride: 102 mmol/L (ref 98–111)
Creatinine, Ser: 0.77 mg/dL (ref 0.61–1.24)
GFR calc non Af Amer: >60 mL/min (ref >60)
Glucose, Bld: 109 mg/dL — ABNORMAL HIGH (ref 70–99)
Potassium: 4.6 mmol/L (ref 3.5–5.1)
Sodium: 136 mmol/L (ref 135–145)

## 2018-05-18 MED ORDER — IOHEXOL 300 MG/ML  SOLN
75.0000 mL | Freq: Once | INTRAMUSCULAR | Status: AC | PRN
  Administered 2018-05-18: 75 mL via INTRAVENOUS
  Filled 2018-05-18: qty 75

## 2018-05-18 MED ORDER — HYDROCOD POLST-CPM POLST ER 10-8 MG/5ML PO SUER: 5.0000 mL | Freq: Once | ORAL | Status: DC

## 2018-05-18 MED ORDER — HYDROCOD POLST-CPM POLST ER 10-8 MG/5ML PO SUER: 5.0000 mL | Freq: Two times a day (BID) | ORAL | 0 refills | Status: DC

## 2018-05-18 MED ORDER — AZITHROMYCIN 250 MG PO TABS: ORAL_TABLET | ORAL | 0 refills | Status: AC

## 2018-05-18 NOTE — Discharge Instructions (Addendum)
Per radiologist recommendation advised CT scan of the chest in 1 year.  Advised smoking cessation.

## 2018-05-18 NOTE — ED Triage Notes (Signed)
Pt c/oo cough with congestion, clear sputum for the past few days, states he has a hx of bronchitis,

## 2018-05-18 NOTE — ED Notes (Signed)
See triage note  Presents with cough and congestion for couple of days  Afebrile on arrival  Hx of bronchitis    Cough is occasionally prod but clear

## 2018-05-18 NOTE — ED Provider Notes (Signed)
[facility] Emergency Department Provider Note   ____________________________________________   First MD Initiated Contact with Patient 05/18/18 1026     (approximate)  I have reviewed the triage vital signs and the nursing notes.   HISTORY  Chief Complaint URI    HPI Stephen [name] is a 63 y.o. male   presents with cough and congestion.  Patient said cough productive consult with clear sputum.  Patient is concerned because he has a history of bronchitis and was diagnosed with pneumonia bronchitis last month.  Patient continues to smoke now which is contributed to this COPD.  Patient denies chest pain or dyspnea at this time.  Patient state.  The past he waited too long to be seen and had to be treated for dyspnea and chest pain.  Past Medical History:  Diagnosis Date  . Allergy    seasonal  . Anxiety     Patient Active Problem List   Diagnosis Date Noted  . Current every day smoker 05/18/2016  . COPD (chronic obstructive pulmonary disease) (HCC) 11/18/2015  . Elevated BP 05/20/2015  . Wheezing on auscultation 01/16/2015  . Anxiety disorder 11/20/2014    History reviewed. No pertinent surgical history.  Prior to Admission medications   Medication Sig Start Date End Date Taking? Authorizing Provider  albuterol (VENTOLIN HFA) 108 (90 Base) MCG/ACT inhaler INHALE 2 PUFFS INTO THE LUNGS EVERY 6 HOURS AS NEEDED FOR WHEEZING ORSHORTNESS OF BREATH 04/17/17   Ellyn Hack, MD  azithromycin (ZITHROMAX Z-PAK) 250 MG tablet Take 2 tablets (500 mg) on  Day 1,  followed by 1 tablet (250 mg) once daily on Days 2 through 5. 05/18/18 05/23/18  Joni Reining, PA-C  budesonide-formoterol [facility]) 160-4.5 MCG/ACT inhaler Inhale 2 puffs into the lungs 2 (two) times daily. Patient not taking: Reported on 03/15/2018 09/25/17   Kerman Passey, MD  chlorpheniramine-HYDROcodone [facility] PENNKINETIC ER) 10-8 MG/5ML SUER Take 5 mLs by mouth 2 (two) times daily.  05/18/18   Joni Reining, PA-C  clonazePAM (KLONOPIN) 1 MG tablet Take 1 tablet (1 mg total) by mouth daily as needed for anxiety. 04/17/17 07/16/17  Ellyn Hack, MD  ibuprofen (ADVIL,MOTRIN) 800 MG tablet Take 1 tablet (800 mg total) by mouth every 8 (eight) hours as needed. 03/17/15   Joni Reining, PA-C    Allergies Patient has no known allergies.  Family History  Problem Relation Age of Onset  . Hypertension Mother   . Hypothyroidism Mother   . Alzheimer's disease Father     Social History Social History   Tobacco Use  . Smoking status: Current Every Day Smoker    Packs/day: 0.50    Years: 30.00    Pack years: 15.00    Types: Cigarettes  . Smokeless tobacco: Never Used  Substance Use Topics  . Alcohol use: No    Alcohol/week: 0.0 standard drinks  . Drug use: No    Review of Systems Constitutional: No fever/chills Eyes: No visual changes. ENT: No sore throat. Cardiovascular: Denies chest pain. Respiratory: Denies shortness of breath. Gastrointestinal: No abdominal pain.  No nausea, no vomiting.  No diarrhea.  No constipation. Genitourinary: Negative for dysuria. Musculoskeletal: Negative for back pain. Skin: Negative for rash. Neurological: Negative for headaches, focal weakness or numbness. Psychiatric:  Anxiety   ____________________________________________   PHYSICAL EXAM:  VITAL SIGNS: ED Triage Vitals  Enc Vitals Group     BP 05/18/18 1001 136/74     Pulse Rate 05/18/18  1001 86     Resp 05/18/18 1001 20     Temp 05/18/18 1001 97.6 F (36.4 C)     Temp Source 05/18/18 1001 Oral     SpO2 05/18/18 1001 96 %     Weight 05/18/18 1002 213 lb (96.6 kg)     Height 05/18/18 1002 6' (1.829 m)     Head Circumference --      Peak Flow --      Pain Score 05/18/18 1012 0     Pain Loc --      Pain Edu? --      Excl. in GC? --    Constitutional: Alert and oriented. Well appearing and in no acute distress. Nose: Edematous nasal turbinates clear  rhinorrhea. Mouth/Throat: Mucous membranes are moist.  Oropharynx non-erythematous. Neck: No stridor.  Hematological/Lymphatic/Immunilogical: No cervical lymphadenopathy. Cardiovascular: Normal rate, regular rhythm. Grossly normal heart sounds.  Good peripheral circulation. Respiratory: Normal respiratory effort.  No retractions. Lungs mild wheezing. Skin:  Skin is warm, dry and intact. No rash noted. Psychiatric: Mood and affect are normal. Speech and behavior are normal.  ____________________________________________   LABS (all labs ordered are listed, but only abnormal results are displayed)  Labs Reviewed  BASIC METABOLIC PANEL - Abnormal; Notable for the following components:      Result Value   Glucose, Bld 109 (*)    All other components within normal limits  CBC WITH DIFFERENTIAL/PLATELET   ____________________________________________  EKG   ____________________________________________  RADIOLOGY  ED MD interpretation:    Official radiology report(s): Dg Chest 2 View  Result Date: 05/18/2018 CLINICAL DATA:  Cough and chest congestion over the last several days. History of bronchitis. EXAM: CHEST - 2 VIEW COMPARISON:  03/15/2018 FINDINGS: Heart size is normal. Some aortic atherosclerotic calcification is noted. The lungs themselves appear clear without evidence of infiltrate, collapse or effusion. Hazy density over the right mid chest laterally appears similar. This is a subtle finding and of questionable validity and significance. The underlying ribs appear normal. Given the fact that this has been questioned, it is difficult to completely dismiss. I think one could either continue to follow this or perform a chest CT. I have low suspicion that it represents a significant entity. IMPRESSION: No likely significant active disease. See above regarding discussion of potential hazy opacity in the right lateral chest as questioned previously. Electronically Signed   By: Paulina Fusi M.D.   On: 05/18/2018 11:17   Ct Chest W Contrast  Result Date: 05/18/2018 CLINICAL DATA:  Productive cough.  Abnormal chest x-ray. EXAM: CT CHEST WITH CONTRAST TECHNIQUE: Multidetector CT imaging of the chest was performed during intravenous contrast administration. CONTRAST:  86mL OMNIPAQUE IOHEXOL 300 MG/ML  SOLN COMPARISON:  Radiographs dated 05/18/2018 FINDINGS: Cardiovascular: Aortic atherosclerosis. Coronary artery calcifications. Heart size is normal. No pericardial effusion. Mediastinum/Nodes: No enlarged mediastinal, hilar, or axillary lymph nodes. Thyroid gland, trachea, and esophagus demonstrate no significant findings. Lungs/Pleura: There are emphysematous changes in both upper lobes. There is a 3.5 mm nodule in the periphery of the lingula of the left upper lobe on image 103 of series 3. No other pulmonary nodules. No infiltrates or effusions. Specifically, no discrete abnormality in the lateral aspect of the right mid lung zone in the area of concern on chest x-ray. Upper Abdomen: Multiple hepatic cysts, the largest measuring 7.2 cm in diameter. No significant abnormalities. Musculoskeletal: No chest wall abnormality. No acute or significant osseous findings. IMPRESSION: 1. Emphysema involving the upper lobes  bilaterally. 2. 3.5 mm peripheral nodular density in the lingula of the left upper lobe. No follow-up needed if patient is low-risk. Non-contrast chest CT can be considered in 12 months if patient is high-risk. This recommendation follows the consensus statement: Guidelines for Management of Incidental Pulmonary Nodules Detected on CT Images: From the Fleischner Society 2017; Radiology 2017; 284:228-243. 3. No focal abnormalities in the right lung. The area of concern on chest x-ray appears clear on the chest CT. 4. Aortic Atherosclerosis (ICD10-I70.0). Coronary artery calcifications. Electronically Signed   By: Francene BoyersJames  Maxwell M.D.   On: 05/18/2018 13:04     ____________________________________________   PROCEDURES  Procedure(s) performed: None  Procedures  Critical Care performed: No  ____________________________________________   INITIAL IMPRESSION / ASSESSMENT AND PLAN / ED COURSE  As part of my medical decision making, I reviewed the following data within the electronic MEDICAL RECORD NUMBER     Patient presents with cough and congestion.  Patient was seen 2 months ago and it was noticed a hazy opacity in the right lateral lung.  Views today are consistent shows no change.  Differential diagnosis of viral syndrome, bronchitis, pneumonia, reactive airway disease or pulmonary edema.  Discussed x-ray and CT findings with patient.  Patient given discharge care instructions and advised smoking cessation.  Take medication as directed and advised [facility]   ____________________________________________   FINAL CLINICAL IMPRESSION(S) / ED DIAGNOSES  Final diagnoses:  Bronchitis     ED Discharge Orders         Ordered    azithromycin (ZITHROMAX Z-PAK) 250 MG tablet     05/18/18 1318    chlorpheniramine-HYDROcodone (TUSSIONEX PENNKINETIC ER) 10-8 MG/5ML SUER  2 times daily     05/18/18 1320           Note:  This document was prepared using Dragon voice recognition software and may include unintentional dictation errors.    Joni ReiningSmith, Taffy Delconte K, PA-C 05/18/18 1324    Schaevitz, Myra Rudeavid Matthew, MD 05/18/18 [phone]

## 2018-09-12 ENCOUNTER — Telehealth: Payer: Self-pay | Admitting: Family Medicine

## 2018-09-12 NOTE — Telephone Encounter (Signed)
Copied from [place] [id number]. Topic: Quick Communication - Rx Refill/Question >> Sep 12, 2018 10:32 AM Rutherford Nail, NT wrote: **Patient calling to request a refill. States that he was waiting for the office to get a male provider for him to see. States that he is willing to make an appointment, he just needs this medication first.**   Medication: albuterol (VENTOLIN HFA) 108 (90 Base) MCG/ACT inhaler   Has the patient contacted their pharmacy?  yes (Agent: If no, request that the patient contact the pharmacy for the refill.) (Agent: If yes, when and what did the pharmacy advise?)  Preferred Pharmacy (with phone number or street name): [place], [place] - [place]: Please be advised that RX refills may take up to 3 business days. We ask that you follow-up with your pharmacy.

## 2018-09-17 ENCOUNTER — Encounter: Payer: Self-pay | Admitting: Family Medicine

## 2018-09-17 ENCOUNTER — Other Ambulatory Visit: Payer: Self-pay

## 2018-09-17 ENCOUNTER — Ambulatory Visit (INDEPENDENT_AMBULATORY_CARE_PROVIDER_SITE_OTHER): Payer: Self-pay | Admitting: Family Medicine

## 2018-09-17 VITALS — BP 130/70 | HR 99 | Temp 97.7°F | Resp 18 | Ht 72.0 in | Wt 212.0 lb

## 2018-09-17 DIAGNOSIS — J449 Chronic obstructive pulmonary disease, unspecified: Secondary | ICD-10-CM

## 2018-09-17 DIAGNOSIS — F411 Generalized anxiety disorder: Secondary | ICD-10-CM

## 2018-09-17 MED ORDER — ALBUTEROL SULFATE HFA 108 (90 BASE) MCG/ACT IN AERS: INHALATION_SPRAY | RESPIRATORY_TRACT | 6 refills | Status: DC

## 2018-09-17 MED ORDER — BUDESONIDE-FORMOTEROL FUMARATE 160-4.5 MCG/ACT IN AERO: 2.0000 | INHALATION_SPRAY | Freq: Two times a day (BID) | RESPIRATORY_TRACT | 11 refills | Status: DC

## 2018-09-17 MED ORDER — BUSPIRONE HCL 5 MG PO TABS: 5.0000 mg | ORAL_TABLET | Freq: Every day | ORAL | 1 refills | Status: DC

## 2018-09-17 NOTE — Progress Notes (Signed)
Name: Stephen PippinsJohn [name]   MRN: [id number]    DOB: 1955-06-16   Date:09/17/2018       Progress Note  Subjective  Chief Complaint  Chief Complaint  Patient presents with  . COPD    medication refill    HPI    COPD: He was taking Symbicort, but ran out about a year ago and it was too expensive to take and the patient assistance program he was using has expired.  He would like a written Rx today to try to get this covered again. He is still smoking about 1.5ppd. Did have CT Chest in February 2020 which showed nodule in LEFT upper lobe - follow up on this in 1 year.  Anxiety/Insomnia: Was taking clonazepam for sleep in the past PRN, was taken off due to safety concerns over benzo's by last provider.  He has trouble with waking in the middle of the night and not being able to go back to sleep - discussed hydroxyzine and buspar as options; wants to trial buspar.  Patient Active Problem List   Diagnosis Date Noted  . Current every day smoker 05/18/2016  . COPD (chronic obstructive pulmonary disease) (HCC) 11/18/2015  . Elevated BP 05/20/2015  . Wheezing on auscultation 01/16/2015  . Anxiety disorder 11/20/2014    No past surgical history on file.  Family History  Problem Relation Age of Onset  . Hypertension Mother   . Hypothyroidism Mother   . Alzheimer's disease Father     Social History   Socioeconomic History  . Marital status: Married    Spouse name: Not on file  . Number of children: Not on file  . Years of education: Not on file  . Highest education level: Not on file  Occupational History  . Not on file  Social Needs  . Financial resource strain: Not on file  . Food insecurity    Worry: Not on file    Inability: Not on file  . Transportation needs    Medical: Not on file    Non-medical: Not on file  Tobacco Use  . Smoking status: Current Every Day Smoker    Packs/day: 0.50    Years: 30.00    Pack years: 15.00    Types: Cigarettes  . Smokeless tobacco:  Never Used  Substance and Sexual Activity  . Alcohol use: No    Alcohol/week: 0.0 standard drinks  . Drug use: No  . Sexual activity: Not Currently  Lifestyle  . Physical activity    Days per week: Not on file    Minutes per session: Not on file  . Stress: Not on file  Relationships  . Social Musicianconnections    Talks on phone: Not on file    Gets together: Not on file    Attends religious service: Not on file    Active member of club or organization: Not on file    Attends meetings of clubs or organizations: Not on file    Relationship status: Not on file  . Intimate partner violence    Fear of current or ex partner: Not on file    Emotionally abused: Not on file    Physically abused: Not on file    Forced sexual activity: Not on file  Other Topics Concern  . Not on file  Social History Narrative  . Not on file     Current Outpatient Medications:  .  albuterol (VENTOLIN HFA) 108 (90 Base) MCG/ACT inhaler, INHALE 2 PUFFS INTO THE LUNGS  EVERY 6 HOURS AS NEEDED FOR WHEEZING ORSHORTNESS OF BREATH, Disp: 18 g, Rfl: 2 .  budesonide-formoterol (SYMBICORT) 160-4.5 MCG/ACT inhaler, Inhale 2 puffs into the lungs 2 (two) times daily., Disp: 10.2 g, Rfl: 0 .  chlorpheniramine-HYDROcodone (TUSSIONEX PENNKINETIC ER) 10-8 MG/5ML SUER, Take 5 mLs by mouth 2 (two) times daily. (Patient not taking: Reported on 09/17/2018), Disp: 115 mL, Rfl: 0 .  clonazePAM (KLONOPIN) 1 MG tablet, Take 1 tablet (1 mg total) by mouth daily as needed for anxiety., Disp: 30 tablet, Rfl: 2 .  ibuprofen (ADVIL,MOTRIN) 800 MG tablet, Take 1 tablet (800 mg total) by mouth every 8 (eight) hours as needed. (Patient not taking: Reported on 09/17/2018), Disp: 30 tablet, Rfl: 0  No Known Allergies  I personally reviewed active problem list, medication list, allergies, notes from last encounter, lab results with the patient/caregiver today.   ROS Constitutional: Negative for fever or weight change.  Respiratory: Positive for  intermittent cough and shortness of breath.   Cardiovascular: Negative for chest pain or palpitations.  Gastrointestinal: Negative for abdominal pain, no bowel changes.  Musculoskeletal: Negative for gait problem or joint swelling.  Skin: Negative for rash.  Neurological: Negative for dizziness or headache.  No other specific complaints in a complete review of systems (except as listed in HPI above).  Objective  Vitals:   09/17/18 0957  BP: 130/70  Pulse: 99  Resp: 18  Temp: 97.7 F (36.5 C)  TempSrc: Oral  SpO2: 99%  Weight: 212 lb (96.2 kg)  Height: 6' (1.829 m)   Body mass index is 28.75 kg/m.  Physical Exam  Constitutional: Patient appears well-developed and well-nourished. No distress.  HENT: Head: Normocephalic and atraumatic. Eyes: Conjunctivae and EOM are normal. No scleral icterus. Neck: Normal range of motion. Neck supple. No JVD present. No thyromegaly present.  Cardiovascular: Normal rate, regular rhythm and normal heart sounds.  No murmur heard. No BLE edema. Pulmonary/Chest: Effort normal and breath sounds with end-expiratory wheeze in RUL and bilateral bases. No respiratory distress. Musculoskeletal: Normal range of motion, no joint effusions. No gross deformities Neurological: Pt is alert and oriented to person, place, and time. No cranial nerve deficit. Coordination, balance, strength, speech and gait are normal.  Skin: Skin is warm and dry. No rash noted. No erythema.  Psychiatric: Patient has a normal mood and affect. behavior is normal. Judgment and thought content normal.  No results found for this or any previous visit (from the past 72 hour(s)).   PHQ2/9: Depression screen [facility] 2/9 09/17/2018 04/17/2017 01/23/2017 08/22/2016 02/18/2016  Decreased Interest 0 0 0 0 0  Down, Depressed, Hopeless 0 0 0 0 0  PHQ - 2 Score 0 0 0 0 0  Altered sleeping 1 - - - -  Tired, decreased energy 0 - - - -  Change in appetite 0 - - - -  Feeling bad or failure about  yourself  0 - - - -  Trouble concentrating 0 - - - -  Moving slowly or fidgety/restless 0 - - - -  Suicidal thoughts 0 - - - -  PHQ-9 Score 1 - - - -  Difficult doing work/chores Not difficult at all - - - -   PHQ-2/9 Result is negative.    Fall Risk: Fall Risk  09/17/2018 04/17/2017 01/23/2017 08/22/2016 02/18/2016  Falls in the past year? 0 No No No No  Number falls in past yr: 0 - - - -  Injury with Fall? 0 - - - -  Follow up Falls evaluation completed - - - -   Assessment & Plan  1. Chronic obstructive pulmonary disease, unspecified COPD type (HCC) - budesonide-formoterol (SYMBICORT) 160-4.5 MCG/ACT inhaler; Inhale 2 puffs into the lungs 2 (two) times daily.  Dispense: 10.2 g; Refill: 11 - albuterol (VENTOLIN HFA) 108 (90 Base) MCG/ACT inhaler; INHALE 2 PUFFS INTO THE LUNGS EVERY 6 HOURS AS NEEDED FOR WHEEZING ORSHORTNESS OF BREATH  Dispense: 18 g; Refill: 6  2. Generalized anxiety disorder - busPIRone (BUSPAR) 5 MG tablet; Take 1 tablet (5 mg total) by mouth at bedtime.  Dispense: 30 tablet; Refill: 1  - Advised we can refer to CCM team to discuss insurance or assistance programs, but he declines today, if he changes his mind he is welcome to call back and I will place referral.

## 2018-09-26 ENCOUNTER — Other Ambulatory Visit: Payer: Self-pay | Admitting: Family Medicine

## 2018-09-26 DIAGNOSIS — J449 Chronic obstructive pulmonary disease, unspecified: Secondary | ICD-10-CM

## 2018-09-26 MED ORDER — BUDESONIDE-FORMOTEROL FUMARATE 160-4.5 MCG/ACT IN AERO: 2.0000 | INHALATION_SPRAY | Freq: Two times a day (BID) | RESPIRATORY_TRACT | 11 refills | Status: DC

## 2018-09-26 NOTE — Telephone Encounter (Signed)
Pt is going thru [employer] social research officer program and they needs symbicort rx to be fax to them at [phone]

## 2018-09-26 NOTE — Telephone Encounter (Signed)
Will you please fax this to his pharmacy at [phone]

## 2018-12-18 ENCOUNTER — Ambulatory Visit: Payer: Self-pay | Admitting: Family Medicine

## 2019-07-25 ENCOUNTER — Other Ambulatory Visit: Payer: Self-pay | Admitting: Family Medicine

## 2019-07-25 DIAGNOSIS — J449 Chronic obstructive pulmonary disease, unspecified: Secondary | ICD-10-CM

## 2019-07-25 NOTE — Telephone Encounter (Signed)
Medication Refill - Medication: albuterol (VENTOLIN HFA) 108 (90 Base) MCG/ACT inhaler   Has the patient contacted their pharmacy? Yes.   (Agent: If no, request that the patient contact the pharmacy for the refill.) (Agent: If yes, when and what did the pharmacy advise?)  Preferred Pharmacy (with phone number or street name):  [facility] - [place], [place] - [address] Phone:  [phone]  Fax:  [phone]       Agent: Please be advised that RX refills may take up to 3 business days. We ask that you follow-up with your pharmacy.

## 2019-07-25 NOTE — Telephone Encounter (Signed)
Requested medication (s) are due for refill today: no  Requested medication (s) are on the active medication list: yes    Future visit scheduled: yes  Notes to clinic:  One inhaler should last at least one month. If the patient is requesting refills earlier, contact the patient to check for uncontrolled symptoms   Requested Prescriptions  Pending Prescriptions Disp Refills   albuterol (VENTOLIN HFA) 108 (90 Base) MCG/ACT inhaler 18 g 0    Sig: INHALE 2 PUFFS INTO THE LUNGS EVERY 6 HOURS AS NEEDED FOR WHEEZING ORSHORTNESS OF BREATH      Pulmonology:  [facility] Failed - 07/25/2019 10:59 AM      Failed - One inhaler should last at least one month. If the patient is requesting refills earlier, contact the patient to check for uncontrolled symptoms.      Passed - Valid encounter within last 12 months    Recent Outpatient Visits           10 months ago Generalized anxiety disorder   [facility] [place], [place] Burton E, FNP   2 years ago Chronic obstructive pulmonary disease, unspecified COPD type [facility])   [facility] Ellyn Hack, MD   2 years ago Chronic obstructive pulmonary disease, unspecified COPD type [facility])   [facility] Ellyn Hack, MD   2 years ago Chronic obstructive pulmonary disease, unspecified COPD type [facility])   [facility] Ellyn Hack, MD   3 years ago Generalized anxiety disorder   [facility] Ellyn Hack, MD       Future Appointments             In 1 week Alba Cory, MD [facility], [facility]

## 2019-07-26 MED ORDER — ALBUTEROL SULFATE HFA 108 (90 BASE) MCG/ACT IN AERS: INHALATION_SPRAY | RESPIRATORY_TRACT | 1 refills | Status: DC

## 2019-08-05 ENCOUNTER — Ambulatory Visit: Payer: Self-pay | Admitting: Family Medicine

## 2019-08-06 ENCOUNTER — Ambulatory Visit: Payer: Self-pay | Admitting: Family Medicine

## 2019-08-12 ENCOUNTER — Encounter: Payer: Self-pay | Admitting: Family Medicine

## 2019-08-12 ENCOUNTER — Ambulatory Visit: Payer: Self-pay | Admitting: Family Medicine

## 2019-08-12 ENCOUNTER — Other Ambulatory Visit: Payer: Self-pay

## 2019-08-12 VITALS — BP 130/70 | HR 97 | Temp 97.3°F | Resp 16 | Ht 72.0 in | Wt 217.9 lb

## 2019-08-12 DIAGNOSIS — R911 Solitary pulmonary nodule: Secondary | ICD-10-CM

## 2019-08-12 DIAGNOSIS — F5104 Psychophysiologic insomnia: Secondary | ICD-10-CM

## 2019-08-12 DIAGNOSIS — J438 Other emphysema: Secondary | ICD-10-CM

## 2019-08-12 DIAGNOSIS — I2510 Atherosclerotic heart disease of native coronary artery without angina pectoris: Secondary | ICD-10-CM

## 2019-08-12 DIAGNOSIS — I700 Atherosclerosis of aorta: Secondary | ICD-10-CM

## 2019-08-12 MED ORDER — ALBUTEROL SULFATE HFA 108 (90 BASE) MCG/ACT IN AERS: INHALATION_SPRAY | RESPIRATORY_TRACT | 1 refills | Status: DC

## 2019-08-12 MED ORDER — PRAVASTATIN SODIUM 40 MG PO TABS: 40.0000 mg | ORAL_TABLET | Freq: Every day | ORAL | 1 refills | Status: DC

## 2019-08-12 MED ORDER — BREZTRI AEROSPHERE 160-9-4.8 MCG/ACT IN AERO: 1.0000 | INHALATION_SPRAY | Freq: Two times a day (BID) | RESPIRATORY_TRACT | 3 refills | Status: DC

## 2019-08-12 NOTE — Progress Notes (Signed)
[name]   MRN: [id number]    DOB: 1956-03-04   Date:08/12/2019       Progress Note  Subjective  Chief Complaint  Chief Complaint  Patient presents with  . Medication Refill  . COPD  . Anxiety    HPI  COPD: He has been on Symbicort for about 3 years, she has been getting it through [employer] assistance program . He is still smoking about 1.5 ppd. Did have CT Chest in February 2020 which showed nodule in LEFT upper lobe - we are supposed to repeat it now, but he does not have insurance and wants to hold off for now. He has noticed increase in sob that is worse in am, he states takes one dose at 11 pm and in the morning between 7-8 am because he cannot wait until 11 am to get second dose. He also has sob with activity, like when playing with his grand-daughter. He does not want to see cardiologist at this time. He has sob with activity, wheezing worse at night, and cough in the mornings that is productive and clear in color. He wants to hold off on immunization due to not having insurance at this time  Atherosclerosis of aorta: on his CT chest seen 05/19/2019 he also has coronary calcifications - discussed starting aspirin 81 mg and statin therapy at least until we can evaluate by cardiologist   Anxiety/Insomnia: Was taking clonazepam for sleep in the past PRN, he has been off, praying at night when he wakes up so he can fall back asleep, does not want to take Buspar  Patient Active Problem List   Diagnosis Date Noted  . Current every day smoker 05/18/2016  . COPD (chronic obstructive pulmonary disease) (HCC) 11/18/2015  . Elevated BP 05/20/2015  . Wheezing on auscultation 01/16/2015  . Anxiety disorder 11/20/2014    History reviewed. No pertinent surgical history.  Family History  Problem Relation Age of Onset  . Hypertension Mother   . Hypothyroidism Mother   . Alzheimer's disease Father     Social History   Tobacco Use  . Smoking status: Current Every Day  Smoker    Packs/day: 0.50    Years: 30.00    Pack years: 15.00    Types: Cigarettes  . Smokeless tobacco: Never Used  Substance Use Topics  . Alcohol use: No    Alcohol/week: 0.0 standard drinks     Current Outpatient Medications:  .  albuterol (VENTOLIN HFA) 108 (90 Base) MCG/ACT inhaler, INHALE 2 PUFFS INTO THE LUNGS EVERY 6 HOURS AS NEEDED FOR WHEEZING ORSHORTNESS OF BREATH, Disp: 18 g, Rfl: 1 .  budesonide-formoterol (SYMBICORT) 160-4.5 MCG/ACT inhaler, Inhale 2 puffs into the lungs 2 (two) times daily., Disp: 10.2 g, Rfl: 11 .  busPIRone (BUSPAR) 5 MG tablet, Take 1 tablet (5 mg total) by mouth at bedtime. (Patient not taking: Reported on 08/12/2019), Disp: 30 tablet, Rfl: 1 .  clonazePAM (KLONOPIN) 1 MG tablet, Take 1 tablet (1 mg total) by mouth daily as needed for anxiety., Disp: 30 tablet, Rfl: 2  No Known Allergies  I personally reviewed active problem list, medication list, allergies, family history, social history, health maintenance with the patient/caregiver today.   ROS  Constitutional: Negative for fever or weight change.  Respiratory: Positive  for cough and shortness of breath.   Cardiovascular: Negative for chest pain ( has some when stressed ) but no  palpitations.  Gastrointestinal: Negative for abdominal pain, no bowel changes.  Musculoskeletal: Negative  for gait problem or joint swelling.  Skin: Negative for rash.  Neurological: Negative for dizziness or headache.  No other specific complaints in a complete review of systems (except as listed in HPI above).  Objective  Vitals:   08/12/19 1427  BP: 130/70  Pulse: 97  Resp: 16  Temp: (!) 97.3 F (36.3 C)  TempSrc: Temporal  SpO2: 96%  Weight: 217 lb 14.4 oz (98.8 kg)  Height: 6' (1.829 m)    Body mass index is 29.55 kg/m.  Physical Exam  Constitutional: Patient appears well-developed and well-nourished. Obese  No distress.  HEENT: head atraumatic, normocephalic, pupils equal and reactive to  light Cardiovascular: Normal rate, regular rhythm and normal heart sounds.  No murmur heard. No BLE edema. Pulmonary/Chest: Effort normal , end inspiratory wheezing l. No respiratory distress. Abdominal: Soft.  There is no tenderness. Psychiatric: Patient has a normal mood and affect. behavior is normal. Judgment and thought content normal.  PHQ2/9: Depression screen [facility] 2/9 08/12/2019 08/12/2019 09/17/2018 04/17/2017 01/23/2017  Decreased Interest 0 0 0 0 0  Down, Depressed, Hopeless 1 0 0 0 0  PHQ - 2 Score 1 0 0 0 0  Altered sleeping 0 0 1 - -  Tired, decreased energy 1 0 0 - -  Change in appetite 0 0 0 - -  Feeling bad or failure about yourself  1 0 0 - -  Trouble concentrating 0 0 0 - -  Moving slowly or fidgety/restless 0 0 0 - -  Suicidal thoughts 0 0 0 - -  PHQ-9 Score 3 0 1 - -  Difficult doing work/chores Not difficult at all - Not difficult at all - -    phq 9 is positive   Fall Risk: Fall Risk  08/12/2019 09/17/2018 04/17/2017 01/23/2017 08/22/2016  Falls in the past year? 0 0 No No No  Number falls in past yr: 0 0 - - -  Injury with Fall? 0 0 - - -  Follow up - Falls evaluation completed - - -     Functional Status Survey: Is the patient deaf or have difficulty hearing?: No Does the patient have difficulty seeing, even when wearing glasses/contacts?: No Does the patient have difficulty concentrating, remembering, or making decisions?: No Does the patient have difficulty walking or climbing stairs?: No Does the patient have difficulty dressing or bathing?: No Does the patient have difficulty doing errands alone such as visiting a doctor's office or shopping?: No    Assessment & Plan  1. Other emphysema (HCC)  - Budeson-Glycopyrrol-Formoterol (BREZTRI AEROSPHERE) 160-9-4.8 MCG/ACT AERO; Inhale 1 puff into the lungs in the morning and at bedtime.  Dispense: 32.1 g; Refill: 3 - albuterol (VENTOLIN HFA) 108 (90 Base) MCG/ACT inhaler; INHALE 2 PUFFS INTO THE LUNGS EVERY  6 HOURS AS NEEDED FOR WHEEZING ORSHORTNESS OF BREATH  Dispense: 18 g; Refill: 1  2. Thoracic aorta atherosclerosis (HCC)  - Lipid panel - COMPLETE METABOLIC PANEL WITH GFR - CBC with Differential/Platelet - pravastatin (PRAVACHOL) 40 MG tablet; Take 1 tablet (40 mg total) by mouth daily.  Dispense: 90 tablet; Refill: 1  3. Psychophysiological insomnia  Doing well, he prays until he falls asleep   4. Coronary artery calcification seen on CT scan  Discussed need for possible stress test since he has noticed some chest pain with stress, but he states wants to hold off until next year, discuss symptoms of heart attacks   5. Lung nodule, solitary  Needs repeat but does not have  insurance

## 2019-08-13 LAB — COMPLETE METABOLIC PANEL WITH GFR
AG Ratio: 1.6 (calc) (ref 1.0–2.5)
ALT: 11 U/L (ref 9–46)
AST: 12 U/L (ref 10–35)
Albumin: 4.1 g/dL (ref 3.6–5.1)
Alkaline phosphatase (APISO): 66 U/L (ref 35–144)
BUN: 13 mg/dL (ref 7–25)
CO2: 33 mmol/L — ABNORMAL HIGH (ref 20–32)
Calcium: 9.5 mg/dL (ref 8.6–10.3)
Chloride: 100 mmol/L (ref 98–110)
Creat: 0.85 mg/dL (ref 0.70–1.25)
GFR, Est African American: 107 mL/min/{1.73_m2} (ref >=60)
GFR, Est Non African American: 92 mL/min/{1.73_m2} (ref >=60)
Globulin: 2.5 g/dL (calc) (ref 1.9–3.7)
Glucose, Bld: 74 mg/dL (ref 65–99)
Potassium: 4.6 mmol/L (ref 3.5–5.3)
Sodium: 139 mmol/L (ref 135–146)
Total Bilirubin: 0.4 mg/dL (ref 0.2–1.2)
Total Protein: 6.6 g/dL (ref 6.1–8.1)

## 2019-08-13 LAB — CBC WITH DIFFERENTIAL/PLATELET
Absolute Monocytes: 656 cells/uL (ref 200–950)
Basophils Absolute: 71 cells/uL (ref 0–200)
Basophils Relative: 0.9 %
Eosinophils Absolute: 245 cells/uL (ref 15–500)
Eosinophils Relative: 3.1 %
HCT: 44.3 % (ref 38.5–50.0)
Hemoglobin: 14.9 g/dL (ref 13.2–17.1)
Lymphs Abs: 1967 cells/uL (ref 850–3900)
MCH: 32.3 pg (ref 27.0–33.0)
MCHC: 33.6 g/dL (ref 32.0–36.0)
MCV: 95.9 fL (ref 80.0–100.0)
MPV: 9.6 fL (ref 7.5–12.5)
Monocytes Relative: 8.3 %
Neutro Abs: 4961 cells/uL (ref 1500–7800)
Neutrophils Relative %: 62.8 %
Platelets: 303 10*3/uL (ref 140–400)
RBC: 4.62 10*6/uL (ref 4.20–5.80)
RDW: 12.6 % (ref 11.0–15.0)
Total Lymphocyte: 24.9 %
WBC: 7.9 10*3/uL (ref 3.8–10.8)

## 2019-08-13 LAB — LIPID PANEL
Cholesterol: 162 mg/dL (ref <200)
HDL: 40 mg/dL (ref >=40)
LDL Cholesterol (Calc): 93 mg/dL (calc)
Non-HDL Cholesterol (Calc): 122 mg/dL (calc) (ref <130)
Total CHOL/HDL Ratio: 4.1 (calc) (ref <5.0)
Triglycerides: 202 mg/dL — ABNORMAL HIGH (ref <150)

## 2019-09-10 ENCOUNTER — Other Ambulatory Visit: Payer: Self-pay | Admitting: Family Medicine

## 2019-09-10 ENCOUNTER — Telehealth: Payer: Self-pay | Admitting: Family Medicine

## 2019-09-10 DIAGNOSIS — J438 Other emphysema: Secondary | ICD-10-CM

## 2019-09-10 MED ORDER — BREZTRI AEROSPHERE 160-9-4.8 MCG/ACT IN AERO: 1.0000 | INHALATION_SPRAY | Freq: Two times a day (BID) | RESPIRATORY_TRACT | 3 refills | Status: DC

## 2019-09-10 NOTE — Telephone Encounter (Signed)
Pt states he gets the Budeson-Glycopyrrol-Formoterol (BREZTRI AEROSPHERE) 160-9-4.8 MCG/ACT AERO For free from [employer] . But they want a 12 mo script.  Dr Carlynn Purl wrote the last one for 3 months.  Pt requesting new Rx that he can pick up that will be for 12 mo.

## 2019-09-11 NOTE — Telephone Encounter (Signed)
Patient needs appointment in order to obtain further refills.  

## 2019-09-11 NOTE — Telephone Encounter (Signed)
Pt was seen last month and spoke with Dr Carlynn Purl in reference to this medication. He stated the company will give him a year supply of this medication if Dr Carlynn Purl would write the prescription for it. He does not want a 3 month supply he need it in a 12 month supply. Stated that Dr Carlynn Purl did this for him last year. The [name] will be replacing the symbicort. Please return call if you need to discuss this further.

## 2019-09-12 NOTE — Telephone Encounter (Signed)
Patient is doing the medication assistance program through the company. Need to pick up a script for a 1 year supply to send to company

## 2019-09-13 NOTE — Telephone Encounter (Signed)
Pt.notified

## 2019-09-16 ENCOUNTER — Other Ambulatory Visit: Payer: Self-pay | Admitting: Family Medicine

## 2019-09-16 DIAGNOSIS — J438 Other emphysema: Secondary | ICD-10-CM

## 2019-09-24 ENCOUNTER — Other Ambulatory Visit: Payer: Self-pay | Admitting: Family Medicine

## 2019-09-24 DIAGNOSIS — J438 Other emphysema: Secondary | ICD-10-CM

## 2019-09-24 MED ORDER — BREZTRI AEROSPHERE 160-9-4.8 MCG/ACT IN AERO: 1.0000 | INHALATION_SPRAY | Freq: Two times a day (BID) | RESPIRATORY_TRACT | 12 refills | Status: DC

## 2019-11-04 ENCOUNTER — Other Ambulatory Visit: Payer: Self-pay | Admitting: Family Medicine

## 2019-11-04 DIAGNOSIS — J438 Other emphysema: Secondary | ICD-10-CM

## 2019-11-04 MED ORDER — BREZTRI AEROSPHERE 160-9-4.8 MCG/ACT IN AERO: 2.0000 | INHALATION_SPRAY | Freq: Two times a day (BID) | RESPIRATORY_TRACT | 12 refills | Status: DC

## 2019-11-04 NOTE — Telephone Encounter (Signed)
Patient is calling back to state that he is working to [employer]  For patient assistance.  The current script is written for Sig: Inhale 1 puff into the lungs in the morning and at bedtime.  Patient is needing a new prescription for [employer] is needing the script to be rewritten for the Sig: 2 puff into the lung 2 in the mornings and 2 in the evening and needs to be written for a year.   If have any question please advise with [phone]  Patient is requesting a CB when the script has been faxed to [facility] Fax Number [phone] Case # [id number]

## 2019-11-05 NOTE — Telephone Encounter (Signed)
Rx has been placed in green folder for signature. Fax number and case number is on back of rx.

## 2020-04-14 ENCOUNTER — Other Ambulatory Visit: Payer: Self-pay

## 2020-04-14 ENCOUNTER — Ambulatory Visit
Admission: EM | Admit: 2020-04-14 | Discharge: 2020-04-14 | Disposition: A | Discharge status: Home / Self Care | Payer: Self-pay | Attending: Family Medicine | Admitting: Family Medicine

## 2020-04-14 DIAGNOSIS — J209 Acute bronchitis, unspecified: Secondary | ICD-10-CM

## 2020-04-14 MED ORDER — PREDNISONE 10 MG PO TABS: 40.0000 mg | ORAL_TABLET | Freq: Every day | ORAL | 0 refills | Status: AC

## 2020-04-14 NOTE — ED Triage Notes (Signed)
Cough and tightness in chest x 2 days.  Hx of bronchitis and states it feels similar.   Does not want a COVID test.

## 2020-04-14 NOTE — Discharge Instructions (Addendum)
Take the prednisone as prescribed.  Continue to use your daily maintenance inhaler and use the albuterol rescue inhaler every 6 hours for cough, wheezing or shortness of breath Follow up as needed for continued or worsening symptoms

## 2020-04-15 NOTE — ED Provider Notes (Signed)
Stephen [name]    CSN: [id number] Arrival date & time: 04/14/20  1148      History   Chief Complaint Chief Complaint  Patient presents with  . Cough    HPI Stephen [name] is a 65 y.o. male.   Patient is a 65 year old male that presents today with cough, chest tightness x2 days.  History of bronchitis and feels similar.  Does not want to be COVID tested.  No fevers, chills, body aches, night sweats.     Past Medical History:  Diagnosis Date  . Allergy    seasonal  . Anxiety     Patient Active Problem List   Diagnosis Date Noted  . Current every day smoker 05/18/2016  . COPD (chronic obstructive pulmonary disease) (HCC) 11/18/2015  . Elevated BP 05/20/2015  . Wheezing on auscultation 01/16/2015  . Anxiety disorder 11/20/2014    History reviewed. No pertinent surgical history.     Home Medications    Prior to Admission medications   Medication Sig Start Date End Date Taking? Authorizing Provider  albuterol (VENTOLIN HFA) 108 (90 Base) MCG/ACT inhaler INHALE 2 PUFFS INTO THE LUNGS EVERY 6 HOURS AS NEEDED FOR WHEEZING ORSHORTNESS OF BREATH 08/12/19  Yes Sowles, Danna Hefty, MD  Budeson-Glycopyrrol-Formoterol (BREZTRI AEROSPHERE) 160-9-4.8 MCG/ACT AERO Inhale 2 puffs into the lungs in the morning and at bedtime. 11/04/19  Yes Sowles, Danna Hefty, MD  predniSONE (DELTASONE) 10 MG tablet Take 4 tablets (40 mg total) by mouth daily for 5 days. 04/14/20 04/19/20 Yes Rital Cavey A, NP  pravastatin (PRAVACHOL) 40 MG tablet Take 1 tablet (40 mg total) by mouth daily. 08/12/19 04/14/20  Alba Cory, MD    Family History Family History  Problem Relation Age of Onset  . Hypertension Mother   . Hypothyroidism Mother   . Alzheimer's disease Father     Social History Social History   Tobacco Use  . Smoking status: Current Every Day Smoker    Packs/day: 0.50    Years: 30.00    Pack years: 15.00    Types: Cigarettes  . Smokeless tobacco: Never Used  Vaping Use  .  Vaping Use: Never used  Substance Use Topics  . Alcohol use: No    Alcohol/week: 0.0 standard drinks  . Drug use: No     Allergies   Patient has no known allergies.   Review of Systems Review of Systems   Physical Exam Triage Vital Signs ED Triage Vitals [04/14/20 1200]  Enc Vitals Group     BP 130/84     Pulse Rate 86     Resp (!) 24     Temp 98 F (36.7 C)     Temp Source Oral     SpO2 94 %     Weight      Height      Head Circumference      Peak Flow      Pain Score 0     Pain Loc      Pain Edu?      Excl. in GC?    No data found.  Updated Vital Signs BP 130/84 (BP Location: Left Arm)   Pulse 86   Temp 98 F (36.7 C) (Oral)   Resp (!) 24   SpO2 94%   Visual Acuity Right Eye Distance:   Left Eye Distance:   Bilateral Distance:    Right Eye Near:   Left Eye Near:    Bilateral Near:     Physical Exam Vitals and  nursing note reviewed.  Constitutional:      Appearance: Normal appearance.  HENT:     Head: Normocephalic and atraumatic.  Eyes:     Conjunctiva/sclera: Conjunctivae normal.  Pulmonary:     Effort: Pulmonary effort is normal.     Breath sounds: Wheezing present.  Musculoskeletal:        General: Normal range of motion.     Cervical back: Normal range of motion.  Skin:    General: Skin is warm and dry.  Neurological:     Mental Status: He is alert.  Psychiatric:        Mood and Affect: Mood normal.      UC Treatments / Results  Labs (all labs ordered are listed, but only abnormal results are displayed) Labs Reviewed - No data to display  EKG   Radiology No results found.  Procedures Procedures (including critical care time)  Medications Ordered in UC Medications - No data to display  Initial Impression / Assessment and Plan / UC Course  I have reviewed the triage vital signs and the nursing notes.  Pertinent labs & imaging results that were available during my care of the patient were reviewed by me and  considered in my medical decision making (see chart for details).     Acute bronchitis Treating with prednisone Recommended continue daily use of maintenance inhaler along with albuterol rescue inhaler every 6 hours for cough, wheezing or shortness of breath. Follow up as needed for continued or worsening symptoms  Final Clinical Impressions(s) / UC Diagnoses   Final diagnoses:  Acute bronchitis, unspecified organism     Discharge Instructions     Take the prednisone as prescribed.  Continue to use your daily maintenance inhaler and use the albuterol rescue inhaler every 6 hours for cough, wheezing or shortness of breath Follow up as needed for continued or worsening symptoms     ED Prescriptions    Medication Sig Dispense Auth. Provider   predniSONE (DELTASONE) 10 MG tablet Take 4 tablets (40 mg total) by mouth daily for 5 days. 20 tablet Dahlia Byes A, NP     PDMP not reviewed this encounter.   Janace Aris, NP 04/15/20 [phone]

## 2020-04-20 ENCOUNTER — Ambulatory Visit (INDEPENDENT_AMBULATORY_CARE_PROVIDER_SITE_OTHER): Payer: Self-pay | Admitting: Internal Medicine

## 2020-04-20 ENCOUNTER — Encounter: Payer: Self-pay | Admitting: Internal Medicine

## 2020-04-20 DIAGNOSIS — J40 Bronchitis, not specified as acute or chronic: Secondary | ICD-10-CM

## 2020-04-20 DIAGNOSIS — F17200 Nicotine dependence, unspecified, uncomplicated: Secondary | ICD-10-CM

## 2020-04-20 DIAGNOSIS — J438 Other emphysema: Secondary | ICD-10-CM

## 2020-04-20 DIAGNOSIS — J441 Chronic obstructive pulmonary disease with (acute) exacerbation: Secondary | ICD-10-CM

## 2020-04-20 MED ORDER — PREDNISONE 20 MG PO TABS: ORAL_TABLET | ORAL | 0 refills | Status: DC

## 2020-04-20 MED ORDER — AZITHROMYCIN 250 MG PO TABS: ORAL_TABLET | ORAL | 0 refills | Status: DC

## 2020-04-20 NOTE — Progress Notes (Signed)
Name: Stephen [name]   MRN: [id number]    DOB: [birth year]   Date:04/20/2020       Progress Note  Subjective  Chief Complaint  Chief Complaint  Patient presents with  . Cough    Onset 04/14/2020. Seen in urgent care on 04/14/2020  . Shortness of Breath    Onset 04/14/2020.    I connected with  [name] on 04/20/20 at  3:00 PM EST by telephone and verified that I am speaking with the correct person using two identifiers.  I discussed the limitations, risks, security and privacy concerns of performing an evaluation and management service by telephone and the availability of in person appointments. The patient expressed understanding and agreed to proceed. Staff also discussed with the patient that there may be a patient responsible charge related to this service. Patient Location: Home Provider Location: [facility] Additional Individuals present: none  HPI Patient is a 65 y.o. male patient of Dr. Carlynn Purl Follows up today with the above complaint via phone visit Noted significant limitations with this beng a phone visit  Covid imm status - not had vaccines Sx;'s started 04/12/20, seen in urgent care and did not want to be Covid tested, A/P as follows:  Acute bronchitis Treating with prednisone Recommended continue daily use of maintenance inhaler along with albuterol rescue inhaler every 6 hours for cough, wheezing or shortness of breath. Follow up as needed for continued or worsening symptoms  + cough, not all the time, sometimes production, clear in the beginning and now yellowish since today + SOB, difficult to sleep at night, + wheezing + No fever, not feeling feverish, no chills or sweats No sore throat.  + mild congestion, no PND No loss of smell, no loss of taste, taste worse, no appetite  No N/V No muscle aches No marked loose stools/diarrhea  No CP, passing out episodes Tob - current everyday smoker - noted has h/o bronchitis and comes on every couple years and knew was  coming on and went to walk in clinic  Takes alb inhaler prn, using every 4-5 hours last few days, also a bid inhaler Orvis Brill), finished prednisone (Friday was last dose) Notes sx's not improved since stared, but noted not worse either. Comorbid conditions reviewed + COPD hx,  + HTN  All - NKDA   Patient Active Problem List   Diagnosis Date Noted  . Current every day smoker 05/18/2016  . COPD (chronic obstructive pulmonary disease) (HCC) 11/18/2015  . Elevated BP 05/20/2015  . Wheezing on auscultation 01/16/2015  . Anxiety disorder 11/20/2014    No past surgical history on file.  Family History  Problem Relation Age of Onset  . Hypertension Mother   . Hypothyroidism Mother   . Alzheimer's disease Father     Social History   Tobacco Use  . Smoking status: Current Every Day Smoker    Packs/day: 0.50    Years: 30.00    Pack years: 15.00    Types: Cigarettes  . Smokeless tobacco: Never Used  Substance Use Topics  . Alcohol use: No    Alcohol/week: 0.0 standard drinks     Current Outpatient Medications:  .  albuterol (VENTOLIN HFA) 108 (90 Base) MCG/ACT inhaler, INHALE 2 PUFFS INTO THE LUNGS EVERY 6 HOURS AS NEEDED FOR WHEEZING ORSHORTNESS OF BREATH, Disp: 18 g, Rfl: 1 .  Budeson-Glycopyrrol-Formoterol (BREZTRI AEROSPHERE) 160-9-4.8 MCG/ACT AERO, Inhale 2 puffs into the lungs in the morning and at bedtime., Disp: 10.7 g, Rfl: 12  No Known  Allergies  With staff assistance, above reviewed with the patient today.  ROS: As per HPI, otherwise no specific complaints on a limited and focused system review   Objective  Virtual encounter, vitals not obtained.  There is no height or weight on file to calculate BMI.  Physical Exam   Appears in NAD via conversation, pleasant, no coughing episodes during phone conversation Pulmonary/Chest: No obvious respiratory distress. Speaking in complete sentences Neurological: Pt is alert, Speech is normal Psychiatric: Patient has  a normal mood and affect, behavior is normal. Judgment and thought content normal.   No results found for this or any previous visit (from the past 72 hour(s)).  PHQ2/9: Depression screen [facility] 2/9 04/20/2020 08/12/2019 08/12/2019 09/17/2018 04/17/2017  Decreased Interest 0 0 0 0 0  Down, Depressed, Hopeless 0 1 0 0 0  PHQ - 2 Score 0 1 0 0 0  Altered sleeping - 0 0 1 -  Tired, decreased energy - 1 0 0 -  Change in appetite - 0 0 0 -  Feeling bad or failure about yourself  - 1 0 0 -  Trouble concentrating - 0 0 0 -  Moving slowly or fidgety/restless - 0 0 0 -  Suicidal thoughts - 0 0 0 -  PHQ-9 Score - 3 0 1 -  Difficult doing work/chores - Not difficult at all - Not difficult at all -   PHQ-2/9 Result reviewed  Fall Risk: Fall Risk  04/20/2020 08/12/2019 09/17/2018 04/17/2017 01/23/2017  Falls in the past year? 0 0 0 No No  Number falls in past yr: 0 0 0 - -  Injury with Fall? 0 0 0 - -  Follow up - - Falls evaluation completed - -     Assessment & Plan  1. COPD exacerbation (HCC) 2. Bronchitis Noted with patient concerns that his symptoms are persisting, and do have concerns for an infectious source.  Discussed concerns that this could be COVID, especially noting he has not been immunized, and how prevalent this has been in the recent past.  Noted if he had been tested at urgent care, and it was positive, possibly could get some entities we know can help in treatment including antibody infusions or oral pills that are now available, although did note they have a limited availability presently.  They also are only offered if symptoms are within the first 10 days, and concern now by the time we get the result back of his test which was recommended today,very  likely will not be within that 10-day.  Do feel getting a COVID test is indicated, and will ask our staff to call him if we are available to do the testing tomorrow afternoon pending staffing, and noted to him if so, he can present  between 3 and 4 in the afternoon and can come to the parking lot and have a test done. Did feel it was important to treat like a COPD exacerbation/bronchitis, with the following recommended: We will add a steroid again, in a weaning fashion as prescribed. Also add an azithromycin product-take as directed Continue with his maintenance inhaler twice daily, and also the albuterol inhaler as needed, and using it more routinely will help with clearance and with symptoms Also recommended a Mucinex or Robitussin product as needed in the short-term Can also use Tylenol products as needed. Rest and staying hydrated important. Emphasized if symptoms not improving or more problematic as the week is progressing, he does need to follow-up.  Also noted  if symptoms worsen more acutely with increasing shortness of breath, any chest pains, higher fevers, he needs to be seen more emergently, in an ER setting and he was understanding of that  - azithromycin (ZITHROMAX) 250 MG tablet; Take 2 tabs on day one, then one tab daily for the next 4 days  Dispense: 6 tablet; Refill: 0 - predniSONE (DELTASONE) 20 MG tablet; Take three tabs daily x 2 days, then two tabs daily X 2 days, then one tab daily X 2 days  Dispense: 12 tablet; Refill: 0  3. Other emphysema (HCC) 4. Tobacco dependence Noted it would be really helpful for him to try to work on quitting to smoke, and strongly encouraged lessening cigarette use in the short-term to help.  Await his response to the above, and hopefully will be able to get a COVID test tomorrow at [facility].  Await that result as well. He does understand the importance of following up if symptoms not improving or more problematic as discussed today.  I discussed the assessment and treatment plan with the patient. The patient was provided an opportunity to ask questions and all were answered. The patient agreed with the plan and demonstrated an understanding of the instructions.  Red  flags and when to present for emergency care or RTC including fevers, chest pain, shortness of breath, new/worsening/un-resolving symptoms reviewed with patient at time of visit.   The patient was advised to call back or seek an in-person evaluation if the symptoms worsen or if the condition fails to improve as anticipated.  I provided 20 minutes of non-face-to-face time during this encounter that included discussing at length patient's sx/history, pertinent pmhx, medications, treatment and follow up plan. This time also included the necessary documentation, orders, and chart review.  Jamelle Haring, MD

## 2020-04-21 ENCOUNTER — Other Ambulatory Visit: Payer: Self-pay | Admitting: Emergency Medicine

## 2020-04-21 DIAGNOSIS — Z20822 Contact with and (suspected) exposure to covid-19: Secondary | ICD-10-CM

## 2020-04-21 NOTE — Progress Notes (Signed)
lab

## 2020-04-23 LAB — NOVEL CORONAVIRUS, NAA: SARS-CoV-2, NAA: DETECTED — AB

## 2020-04-23 LAB — SARS-COV-2, NAA 2 DAY TAT

## 2020-04-27 ENCOUNTER — Ambulatory Visit
Admission: RE | Admit: 2020-04-27 | Discharge: 2020-04-27 | Disposition: A | Discharge status: Home / Self Care | Payer: Self-pay | Source: Ambulatory Visit | Attending: Family Medicine | Admitting: Family Medicine

## 2020-04-27 ENCOUNTER — Encounter: Payer: Self-pay | Admitting: Family Medicine

## 2020-04-27 ENCOUNTER — Ambulatory Visit
Admission: RE | Admit: 2020-04-27 | Discharge: 2020-04-27 | Disposition: A | Discharge status: Home / Self Care | Payer: Self-pay | Attending: Family Medicine | Admitting: Family Medicine

## 2020-04-27 ENCOUNTER — Ambulatory Visit: Payer: Self-pay

## 2020-04-27 ENCOUNTER — Telehealth: Payer: Self-pay

## 2020-04-27 ENCOUNTER — Ambulatory Visit (INDEPENDENT_AMBULATORY_CARE_PROVIDER_SITE_OTHER): Payer: HRSA Program | Admitting: Family Medicine

## 2020-04-27 ENCOUNTER — Other Ambulatory Visit: Payer: Self-pay

## 2020-04-27 DIAGNOSIS — R3589 Other polyuria: Secondary | ICD-10-CM

## 2020-04-27 DIAGNOSIS — U071 COVID-19: Secondary | ICD-10-CM | POA: Diagnosis not present

## 2020-04-27 DIAGNOSIS — I700 Atherosclerosis of aorta: Secondary | ICD-10-CM

## 2020-04-27 DIAGNOSIS — R0602 Shortness of breath: Secondary | ICD-10-CM

## 2020-04-27 DIAGNOSIS — R632 Polyphagia: Secondary | ICD-10-CM

## 2020-04-27 LAB — CBC WITH DIFFERENTIAL/PLATELET
Lymphocytes Absolute: 1.6 10*3/uL (ref 0.7–3.1)
WBC: 8.6 10*3/uL (ref 3.4–10.8)

## 2020-04-27 MED ORDER — BENZONATATE 100 MG PO CAPS: 100.0000 mg | ORAL_CAPSULE | Freq: Two times a day (BID) | ORAL | 0 refills | Status: DC | PRN

## 2020-04-27 NOTE — Progress Notes (Signed)
Name: Stephen [name]   MRN: [id number]    DOB: 05/22/1955   Date:04/27/2020       Progress Note  Subjective  Chief Complaint  Chief Complaint  Patient presents with  . Shortness of Breath    I connected with  [name] on 04/27/20 at  8:20 AM EST by telephone and verified that I am speaking with the correct person using two identifiers.  I discussed the limitations, risks, security and privacy concerns of performing an evaluation and management service by telephone and the availability of in person appointments. Staff also discussed with the patient that there may be a patient responsible charge related to this service. Patient agreed on having a virtual visit  Patient Location: at home  Provider Location: [facility] Additional Individuals present: wife   HPI  SOB: he developed symptoms of bronchitis around 04/12/2020  with increase in cough and wheezing with some rhinorrhea and nasal congestion, he also had lack of sense of taste but normal appetite.Marland Kitchen He went to Urgent care on 04/14/2020 and was given prednisone - he refused getting tested for COVID-19 at Urgent care. He felt better however as soon as prednisone course ended symptoms returned, he saw Dr. Dorris Fetch on 04/20/2020 and was given a Zpack, prednisone . He has COPD and emphysema and has Beztri , he states he has been using extra puffs of Breztri to help but continues to have sob, wheezing  and cough that was initially white and now is thick and yellow. He states only had mild diarrhea when he took Azithromycin but back to normal bowel movements now. He tried going back to work last week but felt very tired and had to go home earlier.   He has noticed increase in appetite and thirsty also voiding more than usual He has switched to drinking more water than soft drinks   Atherosclerosis aorta: he wants to hold off on starting statin therapy     Patient Active Problem List   Diagnosis Date Noted  . Thoracic aorta atherosclerosis  (HCC) 04/27/2020  . Current every day smoker 05/18/2016  . COPD (chronic obstructive pulmonary disease) (HCC) 11/18/2015  . Elevated BP 05/20/2015  . Wheezing on auscultation 01/16/2015  . Anxiety disorder 11/20/2014    History reviewed. No pertinent surgical history.  Family History  Problem Relation Age of Onset  . Hypertension Mother   . Hypothyroidism Mother   . Alzheimer's disease Father       Current Outpatient Medications:  .  albuterol (VENTOLIN HFA) 108 (90 Base) MCG/ACT inhaler, INHALE 2 PUFFS INTO THE LUNGS EVERY 6 HOURS AS NEEDED FOR WHEEZING ORSHORTNESS OF BREATH, Disp: 18 g, Rfl: 1 .  benzonatate (TESSALON) 100 MG capsule, Take 1-2 capsules (100-200 mg total) by mouth 2 (two) times daily as needed., Disp: 40 capsule, Rfl: 0 .  Budeson-Glycopyrrol-Formoterol (BREZTRI AEROSPHERE) 160-9-4.8 MCG/ACT AERO, Inhale 2 puffs into the lungs in the morning and at bedtime., Disp: 10.7 g, Rfl: 12  No Known Allergies  I personally reviewed active problem list, medication list, allergies, family history, social history, health maintenance with the patient/caregiver today.   ROS  Ten systems reviewed and is negative except as mentioned in HPI   Objective  Virtual encounter, vitals not obtained.  There is no height or weight on file to calculate BMI.  Physical Exam  Awake, alert and oriented   PHQ2/9: Depression screen [facility] 2/9 04/27/2020 04/20/2020 08/12/2019 08/12/2019 09/17/2018  Decreased Interest 0 0 0 0 0  Down, Depressed,  Hopeless 0 0 1 0 0  PHQ - 2 Score 0 0 1 0 0  Altered sleeping - - 0 0 1  Tired, decreased energy - - 1 0 0  Change in appetite - - 0 0 0  Feeling bad or failure about yourself  - - 1 0 0  Trouble concentrating - - 0 0 0  Moving slowly or fidgety/restless - - 0 0 0  Suicidal thoughts - - 0 0 0  PHQ-9 Score - - 3 0 1  Difficult doing work/chores - - Not difficult at all - Not difficult at all   PHQ-2/9 Result is negative.    Fall Risk: Fall  Risk  04/27/2020 04/20/2020 08/12/2019 09/17/2018 04/17/2017  Falls in the past year? 0 0 0 0 No  Number falls in past yr: 0 0 0 0 -  Injury with Fall? 0 0 0 0 -  Follow up - - - Falls evaluation completed -     Assessment & Plan  1. SOB (shortness of breath)  - CBC with Differential/Platelet - D-Dimer, Quantitative - DG Chest 2 View; Future - Comprehensive metabolic panel - benzonatate (TESSALON) 100 MG capsule; Take 1-2 capsules (100-200 mg total) by mouth 2 (two) times daily as needed.  Dispense: 40 capsule; Refill: 0  2. Polyuria  Explained prednisone may have caused glucose to spike  - Hemoglobin A1c - Comprehensive metabolic panel  3. Thoracic aorta atherosclerosis (HCC)   4. COVID-19  - CBC with Differential/Platelet - D-Dimer, Quantitative - DG Chest 2 View; Future - benzonatate (TESSALON) 100 MG capsule; Take 1-2 capsules (100-200 mg total) by mouth 2 (two) times daily as needed.  Dispense: 40 capsule; Refill: 0  5. Polyphagia  - Comprehensive metabolic panel  I discussed the assessment and treatment plan with the patient. The patient was provided an opportunity to ask questions and all were answered. The patient agreed with the plan and demonstrated an understanding of the instructions.   The patient was advised to call back or seek an in-person evaluation if the symptoms worsen or if the condition fails to improve as anticipated.  I provided 25 minutes of non-face-to-face time during this encounter.  Ruel Favors, MD

## 2020-04-27 NOTE — Telephone Encounter (Signed)
Copied from CRM [id number]. Topic: General - Other >> Apr 27, 2020 11:49 AM Jaquita Rector A wrote: Reason for CRM: Patient called in to inform Dr Carlynn Purl that he have completed the blood work and and Xray. Wanted to inform Dr Carlynn Purl that he had 2 glass of milk and an egg in the morning because he was not aware that he would do any blood work. Please advise Ph# [phone]

## 2020-04-27 NOTE — Telephone Encounter (Signed)
   Reason for Disposition . [1] PERSISTING SYMPTOMS OF COVID-19 AND [2] symptoms WORSE  Answer Assessment - Initial Assessment Questions 1. COVID-19 ONSET: "When did the symptoms of COVID-19 first start?"     2 weeks ago 2. DIAGNOSIS CONFIRMATION: "How were you diagnosed?" (e.g., COVID-19 oral or nasal viral test; COVID-19 antibody test; doctor visit)     Yes 3. MAIN SYMPTOM:  "What is your main concern or symptom right now?" (e.g., breathing difficulty, cough, fatigue. loss of smell)     Shortness of breath, cough 4. SYMPTOM ONSET: "When did the  cough start?"     2 weeks ago 5. BETTER-SAME-WORSE: "Are you getting better, staying the same, or getting worse over the last 1 to 2 weeks?"     Same 6. RECENT MEDICAL VISIT: "Have you been seen by a healthcare provider (doctor, NP, PA) for these persisting COVID-19 symptoms?" If Yes, ask: "When were you seen?" (e.g., date)     Yes 7. COUGH: "Do you have a cough?" If Yes, ask: "How bad is the cough?"       Keeps him up at night 8. FEVER: "Do you have a fever?" If Yes, ask: "What is your temperature, how was it measured, and when did it start?"     No 9. BREATHING DIFFICULTY: "Are you having any trouble breathing?" If Yes, ask: "How bad is your breathing?" (e.g., mild, moderate, severe)    - MILD: No SOB at rest, mild SOB with walking, speaks normally in sentences, can lie down, no retractions, pulse < 100.    - MODERATE: SOB at rest, SOB with minimal exertion and prefers to sit, cannot lie down flat, speaks in phrases, mild retractions, audible wheezing, pulse 100-120.    - SEVERE: Very SOB at rest, speaks in single words, struggling to breathe, sitting hunched forward, retractions, pulse > 120       Mild - moderate 10. HIGH RISK DISEASE: "Do you have any chronic medical problems?" (e.g., asthma, heart or lung disease, weak immune system, obesity, etc.)       COPD 11. VACCINE: "Have you gotten the COVID-19 vaccine?" If Yes ask: "Which one, how  many shots, when did you get it?"       No 12. PREGNANCY: "Is there any chance you are pregnant?" "When was your last menstrual period?"       N/A 13. OTHER SYMPTOMS: "Do you have any other symptoms?"  (e.g., fatigue, headache, muscle pain, weakness)       Weak  Protocols used: CORONAVIRUS (COVID-19) PERSISTING SYMPTOMS FOLLOW-UP CALL-A-AH

## 2020-04-28 ENCOUNTER — Telehealth: Payer: Self-pay

## 2020-04-28 NOTE — Telephone Encounter (Signed)
Copied from CRM [id number]. Topic: General - Other >> Apr 28, 2020 10:30 AM Gaetana Michaelis A wrote: Reason for CRM: Patient would like to be advised about returning to work. Patient states that cough is lessening and congestion is breaking up but still feels apprehension. Patient had a good night of sleep on 04/27/20 and doesn't want to undermine any progress made. Patient does not require a note at this time for work

## 2020-04-29 LAB — CBC WITH DIFFERENTIAL/PLATELET
Basophils Absolute: 0.1 10*3/uL (ref 0.0–0.2)
Basos: 1 %
EOS (ABSOLUTE): 0.3 10*3/uL (ref 0.0–0.4)
Eos: 4 %
Hematocrit: 46.1 % (ref 37.5–51.0)
Hemoglobin: 16 g/dL (ref 13.0–17.7)
Immature Grans (Abs): 0 10*3/uL (ref 0.0–0.1)
Immature Granulocytes: 0 %
Lymphs: 19 %
MCH: 32.3 pg (ref 26.6–33.0)
MCHC: 34.7 g/dL (ref 31.5–35.7)
MCV: 93 fL (ref 79–97)
Monocytes Absolute: 0.5 10*3/uL (ref 0.1–0.9)
Monocytes: 6 %
Neutrophils Absolute: 6.1 10*3/uL (ref 1.4–7.0)
Neutrophils: 70 %
Platelets: 312 10*3/uL (ref 150–450)
RBC: 4.96 x10E6/uL (ref 4.14–5.80)
RDW: 12.4 % (ref 11.6–15.4)

## 2020-04-29 LAB — D-DIMER, QUANTITATIVE: D-DIMER: 0.53 mg/L FEU — ABNORMAL HIGH (ref 0.00–0.49)

## 2020-04-29 LAB — COMPREHENSIVE METABOLIC PANEL
ALT: 17 IU/L (ref 0–44)
AST: 15 IU/L (ref 0–40)
Albumin/Globulin Ratio: 2 (ref 1.2–2.2)
Albumin: 4.1 g/dL (ref 3.8–4.8)
Alkaline Phosphatase: 65 IU/L (ref 44–121)
BUN/Creatinine Ratio: 17 (ref 10–24)
BUN: 15 mg/dL (ref 8–27)
Bilirubin Total: 0.7 mg/dL (ref 0.0–1.2)
CO2: 26 mmol/L (ref 20–29)
Calcium: 9.5 mg/dL (ref 8.6–10.2)
Chloride: 99 mmol/L (ref 96–106)
Creatinine, Ser: 0.89 mg/dL (ref 0.76–1.27)
GFR calc Af Amer: 104 mL/min/{1.73_m2} (ref >59)
GFR calc non Af Amer: 90 mL/min/{1.73_m2} (ref >59)
Globulin, Total: 2.1 g/dL (ref 1.5–4.5)
Glucose: 111 mg/dL — ABNORMAL HIGH (ref 65–99)
Potassium: 5.1 mmol/L (ref 3.5–5.2)
Sodium: 138 mmol/L (ref 134–144)
Total Protein: 6.2 g/dL (ref 6.0–8.5)

## 2020-04-29 LAB — HEMOGLOBIN A1C
Est. average glucose Bld gHb Est-mCnc: 134 mg/dL
Hgb A1c MFr Bld: 6.3 % — ABNORMAL HIGH (ref 4.8–5.6)

## 2020-08-18 DIAGNOSIS — F17200 Nicotine dependence, unspecified, uncomplicated: Secondary | ICD-10-CM | POA: Insufficient documentation

## 2020-08-25 ENCOUNTER — Telehealth: Payer: Self-pay

## 2020-08-25 NOTE — Telephone Encounter (Signed)
Copied from CRM [id number]. Topic: General - Other >> Aug 25, 2020  2:02 PM Jaquita Rector A wrote: Reason for CRM: Sherlina with [employer] [facility] called in to inform Dr Carlynn Purl that she had a conversation with patient about the Covid test that was done on 04/21/20 patient refuses to pay for this test because he was told by the Dr that it is in fact free when he refused to go and get tested. She is not sure what to do with the bill and would like a call back please when possible at Ph# [phone]

## 2020-09-02 NOTE — Telephone Encounter (Signed)
Pt's covid test was ordered by Dr. Dorris Fetch, who is no longer here.  But didn't seen in the provider note where provider stated that the testing would be free.

## 2020-09-03 ENCOUNTER — Other Ambulatory Visit: Payer: Self-pay | Admitting: Family Medicine

## 2020-09-03 DIAGNOSIS — R319 Hematuria, unspecified: Secondary | ICD-10-CM

## 2020-09-07 ENCOUNTER — Telehealth: Payer: Self-pay | Admitting: *Deleted

## 2020-09-07 ENCOUNTER — Encounter: Payer: Self-pay | Admitting: *Deleted

## 2020-09-07 DIAGNOSIS — Z122 Encounter for screening for malignant neoplasm of respiratory organs: Secondary | ICD-10-CM

## 2020-09-07 DIAGNOSIS — F17200 Nicotine dependence, unspecified, uncomplicated: Secondary | ICD-10-CM

## 2020-09-07 DIAGNOSIS — Z87891 Personal history of nicotine dependence: Secondary | ICD-10-CM

## 2020-09-07 NOTE — Telephone Encounter (Signed)
Received referral for initial lung cancer screening scan. Contacted patient and obtained smoking history, current smoker, 1 ppd x 45 yrs., as well as answering questions related to screening process. Patient denies signs of lung cancer such as weight loss or hemoptysis. Patient denies comorbidity that would prevent curative treatment if lung cancer were found. Patient is scheduled for CT scan on 09/15/20 @ 1:30 pm,  shared decision making visit already done by PCP.

## 2020-09-07 NOTE — Telephone Encounter (Signed)
Received referral for low dose lung cancer screening CT scan. Message left at phone number listed in EMR for patient to call me back to facilitate scheduling scan.  

## 2020-09-15 ENCOUNTER — Ambulatory Visit
Admission: RE | Admit: 2020-09-15 | Discharge: 2020-09-15 | Disposition: A | Discharge status: Home / Self Care | Payer: Medicare PPO | Source: Ambulatory Visit | Attending: Nurse Practitioner | Admitting: Nurse Practitioner

## 2020-09-15 ENCOUNTER — Other Ambulatory Visit: Payer: Self-pay

## 2020-09-15 DIAGNOSIS — Z87891 Personal history of nicotine dependence: Secondary | ICD-10-CM | POA: Insufficient documentation

## 2020-09-15 DIAGNOSIS — R319 Hematuria, unspecified: Secondary | ICD-10-CM | POA: Diagnosis present

## 2020-09-15 DIAGNOSIS — Z122 Encounter for screening for malignant neoplasm of respiratory organs: Secondary | ICD-10-CM | POA: Diagnosis present

## 2020-09-15 DIAGNOSIS — F17200 Nicotine dependence, unspecified, uncomplicated: Secondary | ICD-10-CM | POA: Diagnosis present

## 2020-09-16 ENCOUNTER — Telehealth: Payer: Self-pay | Admitting: *Deleted

## 2020-09-16 NOTE — Telephone Encounter (Signed)
Notified patient of LDCT lung cancer screening program results with recommendation for 12 month follow up imaging. Also notified of incidental findings noted below and is encouraged to discuss further with PCP who will receive a copy of this note and/or the CT report. Patient verbalizes understanding.   IMPRESSION: 1. Lung-RADS 2, benign appearance or behavior. Continue annual screening with low-dose chest CT without contrast in 12 months. 2. Aortic Atherosclerosis (ICD10-I70.0) and Emphysema (ICD10-J43.9). Coronary artery atherosclerosis.  

## 2020-09-17 ENCOUNTER — Ambulatory Visit: Payer: Self-pay

## 2020-10-01 DIAGNOSIS — K7689 Other specified diseases of liver: Secondary | ICD-10-CM | POA: Insufficient documentation

## 2020-10-13 ENCOUNTER — Ambulatory Visit: Payer: Self-pay | Admitting: Family Medicine

## 2020-11-11 ENCOUNTER — Other Ambulatory Visit: Payer: Self-pay

## 2020-11-11 DIAGNOSIS — J438 Other emphysema: Secondary | ICD-10-CM

## 2020-11-12 MED ORDER — ALBUTEROL SULFATE HFA 108 (90 BASE) MCG/ACT IN AERS: INHALATION_SPRAY | RESPIRATORY_TRACT | 0 refills | Status: DC

## 2020-11-16 NOTE — Progress Notes (Signed)
Name: Stephen [name]   MRN: [id number]    DOB: 1956-01-10   Date:11/17/2020       Progress Note  Subjective  Chief Complaint  Chief Complaint  Patient presents with   Covid Exposure   Follow-up    HPI  COPD: he still smokes, last COPD flare was earlier this year when he had COVID-19, he states he was exposed to COVID-19 again while at work on Thursday 11/13/2020. Yesterday he developed fatigue ( slept 12 hours during the day) and fell asleep again at night. He has also noticed two episodes of loose stools, scratchy throat, increase in wheezing and mild SOB, cough is stable with mild amount of phlegm. He never received COVID -19 vaccines    Atherosclerosis of aorta: on his CT chest seen 05/19/2019 he also has coronary calcifications - discussed starting aspirin 81 mg and statin therapy at least until we can evaluate by cardiologist , he does not like medications and will return in one month to discuss it again   Senile purpura: on both arms, reassurance given   Rash Axilla: going on for months, no pain, sometimes itches.   Patient Active Problem List   Diagnosis Date Noted   Senile purpura (HCC) 11/17/2020   Hepatic cyst 10/01/2020   Smoker 08/18/2020   Thoracic aorta atherosclerosis (HCC) 04/27/2020   Current every day smoker 05/18/2016   COPD (chronic obstructive pulmonary disease) (HCC) 11/18/2015   Elevated BP 05/20/2015   Wheezing on auscultation 01/16/2015   Anxiety disorder 11/20/2014    No past surgical history on file.  Family History  Problem Relation Age of Onset   Hypertension Mother    Hypothyroidism Mother    Alzheimer's disease Father      Current Outpatient Medications:    albuterol (VENTOLIN HFA) 108 (90 Base) MCG/ACT inhaler, INHALE 2 PUFFS INTO THE LUNGS EVERY 6 HOURS AS NEEDED FOR WHEEZING ORSHORTNESS OF BREATH, Disp: 18 g, Rfl: 0   benzonatate (TESSALON) 100 MG capsule, Take 1-2 capsules (100-200 mg total) by mouth 2 (two) times daily as needed.,  Disp: 40 capsule, Rfl: 0   Budeson-Glycopyrrol-Formoterol (BREZTRI AEROSPHERE) 160-9-4.8 MCG/ACT AERO, Inhale 2 puffs into the lungs in the morning and at bedtime., Disp: 10.7 g, Rfl: 12   predniSONE (DELTASONE) 10 MG tablet, Take 1-2 tablets (10-20 mg total) by mouth daily with breakfast. 2 in am and one in pm, Disp: 30 tablet, Rfl: 0  No Known Allergies  I personally reviewed active problem list, medication list, allergies with the patient/caregiver today.   ROS  Ten systems reviewed and is negative except as mentioned in HPI  Objective  Vitals with BMI 11/17/2020 09/15/2020 04/14/2020  Height 6\' 0"  6\' 0"  -  Weight 200 lbs 206 lbs -  BMI 27.12 27.93 -  Systolic 134 - 130  Diastolic 80 - 84  Pulse 104 - 86     Physical Exam  Constitutional: Patient appears well-developed No distress.  HEENT: head atraumatic, normocephalic, pupils equal and reactive to lightneck supple, throat within normal limits Cardiovascular: Normal rate, regular rhythm and normal heart sounds.  No murmur heard. No BLE edema. Pulmonary/Chest: Effort normal , he has diffuse expiratory wheezing.  No respiratory distress. Skin: well demarcated rash on right axilla, no oozing, no satellite lesions  Abdominal: Soft.  There is no tenderness. Psychiatric: Patient has a normal mood and affect. behavior is normal. Judgment and thought content normal.    PHQ2/9: Depression screen [facility] 2/9 11/17/2020 04/27/2020 04/20/2020 08/12/2019 08/12/2019  Decreased Interest 0 0 0 0 0  Down, Depressed, Hopeless 0 0 0 1 0  PHQ - 2 Score 0 0 0 1 0  Altered sleeping - - - 0 0  Tired, decreased energy - - - 1 0  Change in appetite - - - 0 0  Feeling bad or failure about yourself  - - - 1 0  Trouble concentrating - - - 0 0  Moving slowly or fidgety/restless - - - 0 0  Suicidal thoughts - - - 0 0  PHQ-9 Score - - - 3 0  Difficult doing work/chores - - - Not difficult at all -   PHQ-2/9 Result is negative.    Fall Risk: Fall Risk   11/17/2020 04/27/2020 04/20/2020 08/12/2019 09/17/2018  Falls in the past year? 0 0 0 0 0  Number falls in past yr: 0 0 0 0 0  Injury with Fall? 0 0 0 0 0  Follow up - - - - Falls evaluation completed     Assessment & Plan  1. COPD exacerbation (HCC)  - Novel Coronavirus, NAA ([facility]) - predniSONE (DELTASONE) 10 MG tablet; Take 1-2 tablets (10-20 mg total) by mouth daily with breakfast. 2 in am and one in pm  Dispense: 30 tablet; Refill: 0  Discussed avoid laying flat at night, drink water, may take zinc and vitamin C otc, rest, social isolation.  We discussed paxlovid and if test positive and patient symptoms worse we will send to pharmacy.   2. Exposure to COVID-19 virus  - Novel Coronavirus, NAA ([facility])  3. SOB (shortness of breath)  - benzonatate (TESSALON) 100 MG capsule; Take 1-2 capsules (100-200 mg total) by mouth 2 (two) times daily as needed.  Dispense: 40 capsule; Refill: 0   4. Senile purpura (HCC)  Reassurance given   5. Rash   Likely fungal, discussed otc topical medication for now    I discussed the assessment and treatment plan with the patient. The patient was provided an opportunity to ask questions and all were answered. The patient agreed with the plan and demonstrated an understanding of the instructions.

## 2020-11-17 ENCOUNTER — Other Ambulatory Visit: Payer: Self-pay

## 2020-11-17 ENCOUNTER — Ambulatory Visit (INDEPENDENT_AMBULATORY_CARE_PROVIDER_SITE_OTHER): Payer: Medicare PPO | Admitting: Family Medicine

## 2020-11-17 ENCOUNTER — Encounter: Payer: Self-pay | Admitting: Family Medicine

## 2020-11-17 VITALS — BP 134/80 | HR 104 | Temp 98.9°F | Resp 16 | Ht 72.0 in | Wt 200.0 lb

## 2020-11-17 DIAGNOSIS — R0602 Shortness of breath: Secondary | ICD-10-CM

## 2020-11-17 DIAGNOSIS — D692 Other nonthrombocytopenic purpura: Secondary | ICD-10-CM

## 2020-11-17 DIAGNOSIS — Z20822 Contact with and (suspected) exposure to covid-19: Secondary | ICD-10-CM

## 2020-11-17 DIAGNOSIS — R21 Rash and other nonspecific skin eruption: Secondary | ICD-10-CM

## 2020-11-17 DIAGNOSIS — J441 Chronic obstructive pulmonary disease with (acute) exacerbation: Secondary | ICD-10-CM

## 2020-11-17 MED ORDER — PREDNISONE 10 MG PO TABS: 10.0000 mg | ORAL_TABLET | Freq: Every day | ORAL | 0 refills | Status: DC

## 2020-11-17 MED ORDER — BENZONATATE 100 MG PO CAPS: 100.0000 mg | ORAL_CAPSULE | Freq: Two times a day (BID) | ORAL | 0 refills | Status: DC | PRN

## 2020-11-19 ENCOUNTER — Telehealth: Payer: Self-pay

## 2020-11-19 LAB — NOVEL CORONAVIRUS, NAA: SARS-CoV-2, NAA: DETECTED — AB

## 2020-11-19 LAB — SARS-COV-2, NAA 2 DAY TAT

## 2020-11-19 NOTE — Telephone Encounter (Signed)
Copied from CRM [id number]. Topic: General - Call Back - No Documentation >> Nov 19, 2020  2:13 PM Leafy Ro wrote: Reason for CRM: Pt is calling and would like covid results Triage Nurse was unable to give due to results was not release to them. Please advise

## 2020-11-23 NOTE — Telephone Encounter (Signed)
Myriam Jacobson gave results friday

## 2020-11-24 NOTE — Telephone Encounter (Signed)
Called pt to get him scheduled this week, pt declined appt and states he sees no need in an appt if he does not qualify for the meds

## 2020-11-24 NOTE — Telephone Encounter (Signed)
Pt is fu with Covid issues. States wish he had took advice and took meication that Dr Carlynn Purl had suggested. He is better but is having lingering issues. He would like to know if he can get medication now? States may fu with him at [phone] but heard to reach, he also states he will be calling pharmacy to, or you may leave a message. Would like a fu asap as knows he should have responded earlier.

## 2021-07-08 ENCOUNTER — Ambulatory Visit (INDEPENDENT_AMBULATORY_CARE_PROVIDER_SITE_OTHER): Payer: Medicare PPO

## 2021-07-08 DIAGNOSIS — Z0000 Encounter for general adult medical examination without abnormal findings: Secondary | ICD-10-CM | POA: Diagnosis not present

## 2021-07-08 DIAGNOSIS — Z122 Encounter for screening for malignant neoplasm of respiratory organs: Secondary | ICD-10-CM

## 2021-07-08 DIAGNOSIS — Z0100 Encounter for examination of eyes and vision without abnormal findings: Secondary | ICD-10-CM

## 2021-07-08 NOTE — Patient Instructions (Signed)
Stephen [name] , ?Thank you for taking time to come for your Medicare Wellness Visit. I appreciate your ongoing commitment to your health goals. Please review the following plan we discussed and let me know if I can assist you in the future.  ? ?Screening recommendations/referrals: ?Colonoscopy: declined ?Recommended yearly ophthalmology/optometry visit for glaucoma screening and checkup ?Recommended yearly dental visit for hygiene and checkup ? ?Vaccinations: ?Influenza vaccine: declined ?Pneumococcal vaccine: declined ?Tdap vaccine: due ?Shingles vaccine: Shingrix discussed. Please contact your pharmacy for coverage information.  ?Covid-19: declined ? ?Advanced directives: Advance directive discussed with you today. Even though you declined this today please call our office should you change your mind and we can give you the proper paperwork for you to fill out.  ? ?Conditions/risks identified: Recommend increasing physical activity  ? ?Next appointment: Follow up in one year for your annual wellness visit.  ? ?Preventive Care 32 Years and Older, Male ?Preventive care refers to lifestyle choices and visits with your health care provider that can promote health and wellness. ?What does preventive care include? ?A yearly physical exam. This is also called an annual well check. ?Dental exams once or twice a year. ?Routine eye exams. Ask your health care provider how often you should have your eyes checked. ?Personal lifestyle choices, including: ?Daily care of your teeth and gums. ?Regular physical activity. ?Eating a healthy diet. ?Avoiding tobacco and drug use. ?Limiting alcohol use. ?Practicing safe sex. ?Taking low doses of aspirin every day. ?Taking vitamin and mineral supplements as recommended by your health care provider. ?What happens during an annual well check? ?The services and screenings done by your health care provider during your annual well check will depend on your age, overall health, lifestyle risk  factors, and family history of disease. ?Counseling  ?Your health care provider may ask you questions about your: ?Alcohol use. ?Tobacco use. ?Drug use. ?Emotional well-being. ?Home and relationship well-being. ?Sexual activity. ?Eating habits. ?History of falls. ?Memory and ability to understand (cognition). ?Work and work Astronomer. ?Screening  ?You may have the following tests or measurements: ?Height, weight, and BMI. ?Blood pressure. ?Lipid and cholesterol levels. These may be checked every 5 years, or more frequently if you are over 7 years old. ?Skin check. ?Lung cancer screening. You may have this screening every year starting at age 77 if you have a 30-pack-year history of smoking and currently smoke or have quit within the past 15 years. ?Fecal occult blood test (FOBT) of the stool. You may have this test every year starting at age 1. ?Flexible sigmoidoscopy or colonoscopy. You may have a sigmoidoscopy every 5 years or a colonoscopy every 10 years starting at age 18. ?Prostate cancer screening. Recommendations will vary depending on your family history and other risks. ?Hepatitis C blood test. ?Hepatitis B blood test. ?Sexually transmitted disease (STD) testing. ?Diabetes screening. This is done by checking your blood sugar (glucose) after you have not eaten for a while (fasting). You may have this done every 1-3 years. ?Abdominal aortic aneurysm (AAA) screening. You may need this if you are a current or former smoker. ?Osteoporosis. You may be screened starting at age 62 if you are at high risk. ?Talk with your health care provider about your test results, treatment options, and if necessary, the need for more tests. ?Vaccines  ?Your health care provider may recommend certain vaccines, such as: ?Influenza vaccine. This is recommended every year. ?Tetanus, diphtheria, and acellular pertussis (Tdap, Td) vaccine. You may need a Td booster every 10  years. ?Zoster vaccine. You may need this after age  56. ?Pneumococcal 13-valent conjugate (PCV13) vaccine. One dose is recommended after age 13. ?Pneumococcal polysaccharide (PPSV23) vaccine. One dose is recommended after age 35. ?Talk to your health care provider about which screenings and vaccines you need and how often you need them. ?This information is not intended to replace advice given to you by your health care provider. Make sure you discuss any questions you have with your health care provider. ?Document Released: 04/17/2015 Document Revised: 12/09/2015 Document Reviewed: 01/20/2015 ?Elsevier Interactive Patient Education ? 2017 [employer]. ? ?Fall Prevention in the Home ?Falls can cause injuries. They can happen to people of all ages. There are many things you can do to make your home safe and to help prevent falls. ?What can I do on the outside of my home? ?Regularly fix the edges of walkways and driveways and fix any cracks. ?Remove anything that might make you trip as you walk through a door, such as a raised step or threshold. ?Trim any bushes or trees on the path to your home. ?Use bright outdoor lighting. ?Clear any walking paths of anything that might make someone trip, such as rocks or tools. ?Regularly check to see if handrails are loose or broken. Make sure that both sides of any steps have handrails. ?Any raised decks and porches should have guardrails on the edges. ?Have any leaves, snow, or ice cleared regularly. ?Use sand or salt on walking paths during winter. ?Clean up any spills in your garage right away. This includes oil or grease spills. ?What can I do in the bathroom? ?Use night lights. ?Install grab bars by the toilet and in the tub and shower. Do not use towel bars as grab bars. ?Use non-skid mats or decals in the tub or shower. ?If you need to sit down in the shower, use a plastic, non-slip stool. ?Keep the floor dry. Clean up any water that spills on the floor as soon as it happens. ?Remove soap buildup in the tub or shower  regularly. ?Attach bath mats securely with double-sided non-slip rug tape. ?Do not have throw rugs and other things on the floor that can make you trip. ?What can I do in the bedroom? ?Use night lights. ?Make sure that you have a light by your bed that is easy to reach. ?Do not use any sheets or blankets that are too big for your bed. They should not hang down onto the floor. ?Have a firm chair that has side arms. You can use this for support while you get dressed. ?Do not have throw rugs and other things on the floor that can make you trip. ?What can I do in the kitchen? ?Clean up any spills right away. ?Avoid walking on wet floors. ?Keep items that you use a lot in easy-to-reach places. ?If you need to reach something above you, use a strong step stool that has a grab bar. ?Keep electrical cords out of the way. ?Do not use floor polish or wax that makes floors slippery. If you must use wax, use non-skid floor wax. ?Do not have throw rugs and other things on the floor that can make you trip. ?What can I do with my stairs? ?Do not leave any items on the stairs. ?Make sure that there are handrails on both sides of the stairs and use them. Fix handrails that are broken or loose. Make sure that handrails are as long as the stairways. ?Check any carpeting to make sure  that it is firmly attached to the stairs. Fix any carpet that is loose or worn. ?Avoid having throw rugs at the top or bottom of the stairs. If you do have throw rugs, attach them to the floor with carpet tape. ?Make sure that you have a light switch at the top of the stairs and the bottom of the stairs. If you do not have them, ask someone to add them for you. ?What else can I do to help prevent falls? ?Wear shoes that: ?Do not have high heels. ?Have rubber bottoms. ?Are comfortable and fit you well. ?Are closed at the toe. Do not wear sandals. ?If you use a stepladder: ?Make sure that it is fully opened. Do not climb a closed stepladder. ?Make sure that  both sides of the stepladder are locked into place. ?Ask someone to hold it for you, if possible. ?Clearly mark and make sure that you can see: ?Any grab bars or handrails. ?First and last steps. ?Where the ed

## 2021-07-08 NOTE — Progress Notes (Signed)
? ?Subjective:  ? Stephen [name] is a 66 y.o. male who presents for an Initial Medicare Annual Wellness Visit. ? ?Virtual Visit via Telephone Note ? ?I connected with  Stephen [name] on 07/08/21 at  1:00 PM EDT by telephone and verified that I am speaking with the correct person using two identifiers. ? ?Location: ?Patient: home ?Provider: [place] ?Persons participating in the virtual visit: patient/Nurse Health Advisor ?  ?I discussed the limitations, risks, security and privacy concerns of performing an evaluation and management service by telephone and the availability of in person appointments. The patient expressed understanding and agreed to proceed. ? ?Interactive audio and video telecommunications were attempted between this nurse and patient, however failed, due to patient having technical difficulties OR patient did not have access to video capability.  We continued and completed visit with audio only. ? ?Some vital signs may be absent or patient reported.  ? ?Clemetine Marker, LPN ? ? ?Review of Systems    ? ?Cardiac Risk Factors include: advanced age (>31men, >23 women);male gender ? ?   ?Objective:  ?  ?Today's Vitals  ? 07/08/21 1310  ?PainSc: 1   ? ?There is no height or weight on file to calculate BMI. ? ? ?  07/08/2021  ?  1:16 PM 05/18/2018  ? 10:12 AM 03/15/2018  ?  7:24 AM 01/23/2017  ?  1:31 PM 08/22/2016  ?  8:49 AM 05/18/2016  ?  9:09 AM 03/06/2016  ?  7:21 AM  ?Advanced Directives  ?Does Patient Have a Medical Advance Directive? No No No No No No No  ?Would patient like information on creating a medical advance directive? No - Patient declined No - Patient declined No - Patient declined      ? ? ?Current Medications (verified) ?Outpatient Encounter Medications as of 07/08/2021  ?Medication Sig  ? albuterol (VENTOLIN HFA) 108 (90 Base) MCG/ACT inhaler INHALE 2 PUFFS INTO THE LUNGS EVERY 6 HOURS AS NEEDED FOR WHEEZING ORSHORTNESS OF BREATH  ? Budeson-Glycopyrrol-Formoterol (BREZTRI AEROSPHERE) 160-9-4.8 MCG/ACT  AERO Inhale 2 puffs into the lungs in the morning and at bedtime.  ? [DISCONTINUED] benzonatate (TESSALON) 100 MG capsule Take 1-2 capsules (100-200 mg total) by mouth 2 (two) times daily as needed.  ? [DISCONTINUED] pravastatin (PRAVACHOL) 40 MG tablet Take 1 tablet (40 mg total) by mouth daily.  ? [DISCONTINUED] predniSONE (DELTASONE) 10 MG tablet Take 1-2 tablets (10-20 mg total) by mouth daily with breakfast. 2 in am and one in pm  ? ?No facility-administered encounter medications on file as of 07/08/2021.  ? ? ?Allergies (verified) ?Patient has no known allergies.  ? ?History: ?Past Medical History:  ?Diagnosis Date  ? Allergy   ? seasonal  ? Anxiety   ? COPD (chronic obstructive pulmonary disease) ([place])   ? ?History reviewed. No pertinent surgical history. ?Family History  ?Problem Relation Age of Onset  ? Hypertension Mother   ? Hypothyroidism Mother   ? Alzheimer's disease Father   ? ?Social History  ? ?Socioeconomic History  ? Marital status: Married  ?  Spouse name: Not on file  ? Number of children: Not on file  ? Years of education: Not on file  ? Highest education level: Not on file  ?Occupational History  ? Not on file  ?Tobacco Use  ? Smoking status: Every Day  ?  Packs/day: 1.00  ?  Years: 45.00  ?  Pack years: 45.00  ?  Types: Cigarettes  ? Smokeless tobacco: Never  ?Vaping Use  ?  Vaping Use: Never used  ?Substance and Sexual Activity  ? Alcohol use: No  ?  Alcohol/week: 0.0 standard drinks  ? Drug use: No  ? Sexual activity: Not Currently  ?Other Topics Concern  ? Not on file  ?Social History Narrative  ? Not on file  ? ?Social Determinants of Health  ? ?Financial Resource Strain: Low Risk   ? Difficulty of Paying Living Expenses: Not hard at all  ?Food Insecurity: No Food Insecurity  ? Worried About Charity fundraiser in the Last Year: Never true  ? Ran Out of Food in the Last Year: Never true  ?Transportation Needs: No Transportation Needs  ? Lack of Transportation (Medical): No  ? Lack of  Transportation (Non-Medical): No  ?Physical Activity: Inactive  ? Days of Exercise per Week: 0 days  ? Minutes of Exercise per Session: 0 min  ?Stress: No Stress Concern Present  ? Feeling of Stress : Not at all  ?Social Connections: Moderately Isolated  ? Frequency of Communication with Friends and Family: More than three times a week  ? Frequency of Social Gatherings with Friends and Family: Twice a week  ? Attends Religious Services: Never  ? Active Member of Clubs or Organizations: No  ? Attends Archivist Meetings: Never  ? Marital Status: Married  ? ? ?Tobacco Counseling ?Ready to quit: Not Answered ?Counseling given: Not Answered ? ? ?Clinical Intake: ? ?Pre-visit preparation completed: Yes ? ?Pain : 0-10 ?Pain Score: 1  ?Pain Type: Chronic pain ?Pain Location: Neck ?Pain Descriptors / Indicators: Aching ?Pain Onset: More than a month ago ?Pain Frequency: Constant ? ?  ? ?Nutritional Risks: None ?Diabetes: No ? ?How often do you need to have someone help you when you read instructions, pamphlets, or other written materials from your doctor or pharmacy?: 1 - Never ? ? ? ?Interpreter Needed?: No ? ?Information entered by :: Clemetine Marker LPN ? ? ?Activities of Daily Living ? ?  07/08/2021  ?  1:19 PM 11/17/2020  ?  2:18 PM  ?In your present state of health, do you have any difficulty performing the following activities:  ?Hearing? 1 0  ?Vision? 1 0  ?Difficulty concentrating or making decisions? 0 0  ?Walking or climbing stairs? 0 0  ?Dressing or bathing? 0 0  ?Doing errands, shopping? 0 0  ?Preparing Food and eating ? N   ?Using the Toilet? N   ?In the past six months, have you accidently leaked urine? N   ?Do you have problems with loss of bowel control? N   ?Managing your Medications? N   ?Managing your Finances? N   ?Housekeeping or managing your Housekeeping? N   ? ? ?Patient Care Team: ?Steele Sizer, MD as PCP - General (Family Medicine) ? ?Indicate any recent Medical Services you may have  received from other than [facility] providers in the past year (date may be approximate). ? ?   ?Assessment:  ? This is a routine wellness examination for Dar[place]. ? ?Hearing/Vision screen ?Hearing Screening - Comments:: Pt c/o mild hearing difficulty due to wax build up in right ear ?Vision Screening - Comments:: Pt c/o poor vision; [facility] to [facility] today ? ?Dietary issues and exercise activities discussed: ?Current Exercise Habits: The patient has a physically strenuous job, but has no regular exercise apart from work., Exercise limited by: None identified ? ? Goals Addressed   ?None ?  ? ?Depression Screen ? ?  07/08/2021  ?  1:15  PM 11/17/2020  ?  2:18 PM 04/27/2020  ?  8:05 AM 04/20/2020  ?  1:56 PM 08/12/2019  ?  2:41 PM 08/12/2019  ?  2:21 PM 09/17/2018  ? 10:00 AM  ?PHQ 2/9 Scores  ?PHQ - 2 Score 0 0 0 0 1 0 0  ?PHQ- 9 Score     3 0 1  ?  ?Fall Risk ? ?  07/08/2021  ?  1:19 PM 11/17/2020  ?  2:18 PM 04/27/2020  ?  8:05 AM 04/20/2020  ?  1:56 PM 08/12/2019  ?  2:21 PM  ?Fall Risk   ?Falls in the past year? 0 0 0 0 0  ?Number falls in past yr: 0 0 0 0 0  ?Injury with Fall? 0 0 0 0 0  ?Risk for fall due to : No Fall Risks      ?Follow up Falls prevention discussed      ? ? ?FALL RISK PREVENTION PERTAINING TO THE HOME: ? ?Any stairs in or around the home? Yes  ?If so, are there any without handrails? No  ?Home free of loose throw rugs in walkways, pet beds, electrical cords, etc? Yes  ?Adequate lighting in your home to reduce risk of falls? Yes  ? ?ASSISTIVE DEVICES UTILIZED TO PREVENT FALLS: ? ?Life alert? No  ?Use of a cane, walker or w/c? No  ?Grab bars in the bathroom? Yes  ?Shower chair or bench in shower? No  ?Elevated toilet seat or a handicapped toilet? No  ? ?TIMED UP AND GO: ? ?Was the test performed? No . Telephonic visit.  ? ?Cognitive Function: Normal cognitive status assessed by direct observation by this Nurse Health Advisor. No abnormalities found.  ? ?  ?  ?  ? ?Immunizations ? ?There is no  immunization history on file for this patient. ? ?TDAP status: Due, Education has been provided regarding the importance of this vaccine. Advised may receive this vaccine at local pharmacy or Health Dept. Aware to provide a

## 2021-08-17 ENCOUNTER — Ambulatory Visit: Payer: Medicare PPO | Admitting: Family Medicine

## 2021-08-24 NOTE — Progress Notes (Unsigned)
Name: Stephen [name]   MRN: [id number]    DOB: 1955/07/29   Date:08/25/2021       Progress Note  Subjective  Chief Complaint  Follow Up  HPI  Emphysema:he started smoking at age 66 and smokes on average one pack daily, not ready to quit.  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 He has to stop or use rescue inhaler to improve symptoms. He is getting Brezti from [employer]    Right ear fullness: he also noticed some drainage in am's, feels muffled  Atherosclerosis of aorta: on his CT chest seen 05/19/2019 he also has coronary calcifications - discussed starting aspirin 81 mg and statin therapy , today he is wiling to try taking cholesterol medication  B12 deficiency: he stopped taking supplementation months , he has noticed numbness on all toes and now migrating to mid foot. No weakness  DDD lumbar spine: he had CT abdomen that showed DDD L4-S1 and large protusion L5-S1, he has been taking ibuprofen four of otc tablets every night, discussed risk of NSAID's and we will try muscle relaxer instead and Tylenol   Left side neck pain: intermittent pain, started end of 2022, no trauma, almost daily, worse with palpation and rom of neck, no radiculitis.   Senile purpura: on both arms, reassurance given . Explained it will get worse with aspirin   IPSS Questionnaire (AUA-7): Over the past month.   1)  How often have you had a sensation of not emptying your bladder completely after you finish urinating?  0 - Not at all  2)  How often have you had to urinate again less than two hours after you finished urinating? 1 - Less than 1 time in 5  3)  How often have you found you stopped and started again several times when you urinated?  4 - More than half the time  4) How difficult have you found it to postpone  urination?  5 - Almost always  5) How often have you had a weak urinary stream?  0 - Not at all  6) How often have you had to push or strain to begin urination?  0 - Not at all  7) How many times did you most typically get up to urinate from the time you went to bed until the time you got up in the morning?  3 - 3 times  Total score:  0-7 mildly symptomatic   8-19 moderately symptomatic   20-35 severely symptomatic     Patient Active Problem List   Diagnosis Date Noted   B12 deficiency 08/25/2021   Hyperglycemia 08/25/2021   Lower urinary tract symptoms (LUTS) 08/25/2021   Coronary artery calcification of native artery 08/25/2021   Hematuria, microscopic 08/25/2021   Atypical pigmented lesion 08/25/2021   Senile purpura ([place]) 11/17/2020   Hepatic cyst 10/01/2020   Smoker 08/18/2020   Thoracic aorta atherosclerosis ([place]) 04/27/2020   Current every day smoker 05/18/2016   Other emphysema ([place]) 11/18/2015    No past surgical history on file.  Family History  Problem Relation Age of Onset   Hypertension Mother    Hypothyroidism Mother    Alzheimer's disease Father     Social History   Tobacco Use   Smoking status: Every Day  Packs/day: 1.00    Years: 45.00    Pack years: 45.00    Types: Cigarettes   Smokeless tobacco: Never  Substance Use Topics   Alcohol use: No    Alcohol/week: 0.0 standard drinks     Current Outpatient Medications:    aspirin EC 81 MG tablet, Take 1 tablet (81 mg total) by mouth daily. Swallow whole., Disp: 30 tablet, Rfl: 12   rosuvastatin (CRESTOR) 10 MG tablet, Take 1 tablet (10 mg total) by mouth daily., Disp: 90 tablet, Rfl: 3   tiZANidine (ZANAFLEX) 2 MG tablet, Take 1-2 tablets (2-4 mg total) by mouth every 6 (six) hours as needed for muscle spasms., Disp: 60 tablet, Rfl: 0   albuterol (VENTOLIN HFA) 108 (90 Base) MCG/ACT inhaler, INHALE 2 PUFFS INTO THE LUNGS EVERY 6 HOURS AS NEEDED FOR WHEEZING ORSHORTNESS OF BREATH, Disp: 18 g, Rfl: 1    Budeson-Glycopyrrol-Formoterol (BREZTRI AEROSPHERE) 160-9-4.8 MCG/ACT AERO, Inhale 2 puffs into the lungs in the morning and at bedtime., Disp: 32.1 g, Rfl: 3  No Known Allergies  I personally reviewed active problem list, medication list, allergies, family history, social history, health maintenance with the patient/caregiver today.   ROS  Constitutional: Negative for fever or  weight change.  Respiratory: Positive  for cough and shortness of breath.   Cardiovascular: Negative for chest pain or palpitations.  Gastrointestinal: Negative for abdominal pain, no bowel changes.  Musculoskeletal: Negative for gait problem or joint swelling.  Skin: Negative for rash.  Neurological: Negative for dizziness or headache.  No other specific complaints in a complete review of systems (except as listed in HPI above).   Objective  Vitals:   08/25/21 1001  BP: 136/82  Pulse: 78  Resp: 16  SpO2: 97%  Weight: 202 lb (91.6 kg)  Height: 6' (1.829 m)    Body mass index is 27.4 kg/m.  Physical Exam  Constitutional: Patient appears well-developed and well-nourished.  No distress.  HEENT: head atraumatic, normocephalic, pupils equal and reactive to light, ears cerumen impaction right , neck supple but tender during palpation of left trapezium muscle, throat within normal limits Cardiovascular: Normal rate, regular rhythm and normal heart sounds.  No murmur heard. No BLE edema. Skin: atypical seborrheic keratosis of right anterior wall  Pulmonary/Chest: Effort normal and breath sounds normal. No respiratory distress. Abdominal: Soft.  There is no tenderness. Psychiatric: Patient has a normal mood and affect. behavior is normal. Judgment and thought content normal.   PHQ2/9:    08/25/2021    9:56 AM 07/08/2021    1:15 PM 11/17/2020    2:18 PM 04/27/2020    8:05 AM 04/20/2020    1:56 PM  Depression screen PHQ 2/9  Decreased Interest 0 0 0 0 0  Down, Depressed, Hopeless 0 0 0 0 0  PHQ - 2 Score  0 0 0 0 0  Altered sleeping 0      Tired, decreased energy 0      Change in appetite 0      Feeling bad or failure about yourself  0      Trouble concentrating 0      Moving slowly or fidgety/restless 0      Suicidal thoughts 0      PHQ-9 Score 0        phq 9 is negative   Fall Risk:    08/25/2021    9:54 AM 07/08/2021    1:19 PM 11/17/2020    2:18 PM 04/27/2020    8:05  AM 04/20/2020    1:56 PM  [place] in the past year? 0 0 0 0 0  Number falls in past yr: 0 0 0 0 0  Injury with Fall? 0 0 0 0 0  Risk for fall due to : No Fall Risks No Fall Risks     Follow up Falls prevention discussed Falls prevention discussed         Functional Status Survey: Is the patient deaf or have difficulty hearing?: Yes Does the patient have difficulty seeing, even when wearing glasses/contacts?: Yes Does the patient have difficulty concentrating, remembering, or making decisions?: No Does the patient have difficulty walking or climbing stairs?: No Does the patient have difficulty dressing or bathing?: No Does the patient have difficulty doing errands alone such as visiting a doctor's office or shopping?: No    Assessment & Plan  Problem List Items Addressed This Visit     Other emphysema ([place])    Continue Brezti Explained SOB may be secondary to heart disease        Relevant Medications   Budeson-Glycopyrrol-Formoterol (BREZTRI AEROSPHERE) 160-9-4.8 MCG/ACT AERO   albuterol (VENTOLIN HFA) 108 (90 Base) MCG/ACT inhaler   Other Relevant Orders   Ambulatory Referral Lung Cancer Screening [facility]   Thoracic aorta atherosclerosis ([place]) - Primary    He is willing to start statin therapy Sending rx of Crestor       Relevant Medications   rosuvastatin (CRESTOR) 10 MG tablet   aspirin EC 81 MG tablet   Other Relevant Orders   Lipid panel   Senile purpura ([place])    Reassurance given        Relevant Medications   rosuvastatin (CRESTOR) 10 MG tablet   aspirin EC  81 MG tablet   B12 deficiency    May be the cause of foot numbness, we will recheck level and advised to resume supplementation        Relevant Orders   B12 and Folate Panel   Hyperglycemia    We will check A1C       Relevant Orders   Hemoglobin A1c   Lower urinary tract symptoms (LUTS)    We will check PSA       Relevant Orders   PSA   Coronary artery calcification of native artery    He is willing to try aspirin and statin therapy        Relevant Medications   rosuvastatin (CRESTOR) 10 MG tablet   aspirin EC 81 MG tablet   Hematuria, microscopic    He went to see a PA last year and found to have hematuria, CT no kidney stone We will recheck it and if positive refer to Urologist        Relevant Orders   Urinalysis, Complete   Atypical pigmented lesion    Discussed shave biopsy today but he refused Looks like seborrheic keratosis but with dark spots and worried about melanoma       Other Visit Diagnoses     Need for pneumococcal 20-valent conjugate vaccination       Relevant Orders   Pneumococcal conjugate vaccine 20-valent (Prevnar 20) (Completed)   Colon cancer screening       Tobacco dependence       Relevant Orders   Ambulatory Referral Lung Cancer Screening [facility]   Long-term use of high-risk medication       Relevant Orders   CBC with Differential/Platelet   COMPLETE METABOLIC PANEL WITH  GFR   Prostate cancer screening       Relevant Orders   PSA   Need for hepatitis C screening test       Relevant Orders   Hepatitis C Antibody   DDD (degenerative disc disease), lumbosacral       Relevant Medications   aspirin EC 81 MG tablet   tiZANidine (ZANAFLEX) 2 MG tablet   Neck pain on left side       Impacted cerumen of right ear       Relevant Orders   Ear Lavage        Verbal consent given Possible side effects discussed with patient Ears were  lavaged with warm water and peroxide  Patient tolerated procedure well No  complications

## 2021-08-25 ENCOUNTER — Encounter: Payer: Self-pay | Admitting: Family Medicine

## 2021-08-25 ENCOUNTER — Other Ambulatory Visit: Payer: Self-pay

## 2021-08-25 ENCOUNTER — Ambulatory Visit (INDEPENDENT_AMBULATORY_CARE_PROVIDER_SITE_OTHER): Payer: Medicare PPO | Admitting: Family Medicine

## 2021-08-25 VITALS — BP 136/82 | HR 78 | Resp 16 | Ht 72.0 in | Wt 202.0 lb

## 2021-08-25 DIAGNOSIS — E538 Deficiency of other specified B group vitamins: Secondary | ICD-10-CM

## 2021-08-25 DIAGNOSIS — Z79899 Other long term (current) drug therapy: Secondary | ICD-10-CM

## 2021-08-25 DIAGNOSIS — Z1159 Encounter for screening for other viral diseases: Secondary | ICD-10-CM

## 2021-08-25 DIAGNOSIS — I700 Atherosclerosis of aorta: Secondary | ICD-10-CM

## 2021-08-25 DIAGNOSIS — Z125 Encounter for screening for malignant neoplasm of prostate: Secondary | ICD-10-CM

## 2021-08-25 DIAGNOSIS — Z23 Encounter for immunization: Secondary | ICD-10-CM

## 2021-08-25 DIAGNOSIS — M542 Cervicalgia: Secondary | ICD-10-CM

## 2021-08-25 DIAGNOSIS — J438 Other emphysema: Secondary | ICD-10-CM

## 2021-08-25 DIAGNOSIS — H6121 Impacted cerumen, right ear: Secondary | ICD-10-CM

## 2021-08-25 DIAGNOSIS — R3129 Other microscopic hematuria: Secondary | ICD-10-CM | POA: Insufficient documentation

## 2021-08-25 DIAGNOSIS — Z87891 Personal history of nicotine dependence: Secondary | ICD-10-CM

## 2021-08-25 DIAGNOSIS — F17210 Nicotine dependence, cigarettes, uncomplicated: Secondary | ICD-10-CM

## 2021-08-25 DIAGNOSIS — I2510 Atherosclerotic heart disease of native coronary artery without angina pectoris: Secondary | ICD-10-CM | POA: Insufficient documentation

## 2021-08-25 DIAGNOSIS — Z122 Encounter for screening for malignant neoplasm of respiratory organs: Secondary | ICD-10-CM

## 2021-08-25 DIAGNOSIS — R399 Unspecified symptoms and signs involving the genitourinary system: Secondary | ICD-10-CM | POA: Insufficient documentation

## 2021-08-25 DIAGNOSIS — M5137 Other intervertebral disc degeneration, lumbosacral region: Secondary | ICD-10-CM

## 2021-08-25 DIAGNOSIS — Z1211 Encounter for screening for malignant neoplasm of colon: Secondary | ICD-10-CM

## 2021-08-25 DIAGNOSIS — I2584 Coronary atherosclerosis due to calcified coronary lesion: Secondary | ICD-10-CM

## 2021-08-25 DIAGNOSIS — L819 Disorder of pigmentation, unspecified: Secondary | ICD-10-CM

## 2021-08-25 DIAGNOSIS — D692 Other nonthrombocytopenic purpura: Secondary | ICD-10-CM | POA: Diagnosis not present

## 2021-08-25 DIAGNOSIS — Z0000 Encounter for general adult medical examination without abnormal findings: Secondary | ICD-10-CM

## 2021-08-25 DIAGNOSIS — F17200 Nicotine dependence, unspecified, uncomplicated: Secondary | ICD-10-CM

## 2021-08-25 DIAGNOSIS — R739 Hyperglycemia, unspecified: Secondary | ICD-10-CM

## 2021-08-25 MED ORDER — TIZANIDINE HCL 2 MG PO TABS: 2.0000 mg | ORAL_TABLET | Freq: Four times a day (QID) | ORAL | 0 refills | Status: DC | PRN

## 2021-08-25 MED ORDER — ALBUTEROL SULFATE HFA 108 (90 BASE) MCG/ACT IN AERS: INHALATION_SPRAY | RESPIRATORY_TRACT | 0 refills | Status: DC

## 2021-08-25 MED ORDER — BREZTRI AEROSPHERE 160-9-4.8 MCG/ACT IN AERO: 2.0000 | INHALATION_SPRAY | Freq: Two times a day (BID) | RESPIRATORY_TRACT | 3 refills | Status: DC

## 2021-08-25 MED ORDER — ASPIRIN 81 MG PO TBEC: 81.0000 mg | DELAYED_RELEASE_TABLET | Freq: Every day | ORAL | 12 refills | Status: AC

## 2021-08-25 MED ORDER — ROSUVASTATIN CALCIUM 10 MG PO TABS: 10.0000 mg | ORAL_TABLET | Freq: Every day | ORAL | 3 refills | Status: DC

## 2021-08-25 MED ORDER — ALBUTEROL SULFATE HFA 108 (90 BASE) MCG/ACT IN AERS: INHALATION_SPRAY | RESPIRATORY_TRACT | 1 refills | Status: DC

## 2021-08-25 NOTE — Assessment & Plan Note (Signed)
Reassurance given.  

## 2021-08-25 NOTE — Assessment & Plan Note (Signed)
He is willing to start statin therapy Sending rx of Crestor

## 2021-08-25 NOTE — Assessment & Plan Note (Signed)
Continue Brezti Explained SOB may be secondary to heart disease

## 2021-08-25 NOTE — Assessment & Plan Note (Signed)
We will check A1C

## 2021-08-25 NOTE — Assessment & Plan Note (Signed)
He went to see a PA last year and found to have hematuria, CT no kidney stone We will recheck it and if positive refer to Urologist

## 2021-08-25 NOTE — Assessment & Plan Note (Signed)
He is willing to try aspirin and statin therapy

## 2021-08-25 NOTE — Assessment & Plan Note (Signed)
Discussed shave biopsy today but he refused Looks like seborrheic keratosis but with dark spots and worried about melanoma

## 2021-08-25 NOTE — Assessment & Plan Note (Signed)
May be the cause of foot numbness, we will recheck level and advised to resume supplementation

## 2021-08-25 NOTE — Assessment & Plan Note (Signed)
We will check PSA

## 2021-08-26 ENCOUNTER — Other Ambulatory Visit: Payer: Self-pay

## 2021-08-26 DIAGNOSIS — R3589 Other polyuria: Secondary | ICD-10-CM

## 2021-08-26 DIAGNOSIS — R399 Unspecified symptoms and signs involving the genitourinary system: Secondary | ICD-10-CM

## 2021-08-26 LAB — CBC WITH DIFFERENTIAL/PLATELET
Absolute Monocytes: 546 cells/uL (ref 200–950)
Basophils Absolute: 60 cells/uL (ref 0–200)
Basophils Relative: 1 %
Eosinophils Absolute: 192 cells/uL (ref 15–500)
Eosinophils Relative: 3.2 %
HCT: 46.1 % (ref 38.5–50.0)
Hemoglobin: 15.6 g/dL (ref 13.2–17.1)
Lymphs Abs: 1260 cells/uL (ref 850–3900)
MCH: 32.7 pg (ref 27.0–33.0)
MCHC: 33.8 g/dL (ref 32.0–36.0)
MCV: 96.6 fL (ref 80.0–100.0)
MPV: 9.9 fL (ref 7.5–12.5)
Monocytes Relative: 9.1 %
Neutro Abs: 3942 cells/uL (ref 1500–7800)
Neutrophils Relative %: 65.7 %
Platelets: 287 10*3/uL (ref 140–400)
RBC: 4.77 10*6/uL (ref 4.20–5.80)
RDW: 12.3 % (ref 11.0–15.0)
Total Lymphocyte: 21 %
WBC: 6 10*3/uL (ref 3.8–10.8)

## 2021-08-26 LAB — URINALYSIS, COMPLETE
Bacteria, UA: NONE SEEN /HPF
Bilirubin Urine: NEGATIVE
Glucose, UA: NEGATIVE
Hyaline Cast: NONE SEEN /LPF
Leukocytes,Ua: NEGATIVE
Nitrite: NEGATIVE
Protein, ur: NEGATIVE
Specific Gravity, Urine: 1.021 (ref 1.001–1.035)
Squamous Epithelial / HPF: NONE SEEN /HPF (ref <=5)
WBC, UA: NONE SEEN /HPF (ref 0–5)
pH: 6 (ref 5.0–8.0)

## 2021-08-26 LAB — COMPLETE METABOLIC PANEL WITH GFR
AG Ratio: 1.4 (calc) (ref 1.0–2.5)
ALT: 12 U/L (ref 9–46)
AST: 15 U/L (ref 10–35)
Albumin: 4.2 g/dL (ref 3.6–5.1)
Alkaline phosphatase (APISO): 72 U/L (ref 35–144)
BUN: 14 mg/dL (ref 7–25)
CO2: 26 mmol/L (ref 20–32)
Calcium: 9.7 mg/dL (ref 8.6–10.3)
Chloride: 101 mmol/L (ref 98–110)
Creat: 0.92 mg/dL (ref 0.70–1.35)
Globulin: 2.9 g/dL (calc) (ref 1.9–3.7)
Glucose, Bld: 97 mg/dL (ref 65–99)
Potassium: 4.9 mmol/L (ref 3.5–5.3)
Sodium: 138 mmol/L (ref 135–146)
Total Bilirubin: 1 mg/dL (ref 0.2–1.2)
Total Protein: 7.1 g/dL (ref 6.1–8.1)
eGFR: 92 mL/min/{1.73_m2} (ref >=60)

## 2021-08-26 LAB — LIPID PANEL
Cholesterol: 177 mg/dL (ref <200)
HDL: 50 mg/dL (ref >=40)
LDL Cholesterol (Calc): 111 mg/dL (calc) — ABNORMAL HIGH
Non-HDL Cholesterol (Calc): 127 mg/dL (calc) (ref <130)
Total CHOL/HDL Ratio: 3.5 (calc) (ref <5.0)
Triglycerides: 71 mg/dL (ref <150)

## 2021-08-26 LAB — HEMOGLOBIN A1C
Hgb A1c MFr Bld: 5.8 % of total Hgb — ABNORMAL HIGH (ref <5.7)
Mean Plasma Glucose: 120 mg/dL
eAG (mmol/L): 6.6 mmol/L

## 2021-08-26 LAB — HEPATITIS C ANTIBODY
Hepatitis C Ab: NONREACTIVE
SIGNAL TO CUT-OFF: 0.12 (ref <1.00)

## 2021-08-26 LAB — B12 AND FOLATE PANEL
Folate: 13.6 ng/mL
Vitamin B-12: 447 pg/mL (ref 200–1100)

## 2021-08-26 LAB — PSA: PSA: 0.08 ng/mL (ref <=4.00)

## 2021-09-09 ENCOUNTER — Telehealth: Payer: Self-pay | Admitting: Family Medicine

## 2021-09-09 NOTE — Telephone Encounter (Signed)
Copied from CRM [id number]. Topic: General - Call Back - No Documentation >> Sep 09, 2021  3:13 PM Dondra Prader E wrote: Reason for CRM: Ethelene Browns from radiology scheduling called to request orders from PCP. Per the radiologist "based on the notes he may need a hematuria protocol instead" Best contact: [phone]

## 2021-09-10 ENCOUNTER — Other Ambulatory Visit: Payer: Self-pay

## 2021-09-10 DIAGNOSIS — R399 Unspecified symptoms and signs involving the genitourinary system: Secondary | ICD-10-CM

## 2021-09-10 DIAGNOSIS — R3589 Other polyuria: Secondary | ICD-10-CM

## 2021-09-14 ENCOUNTER — Ambulatory Visit: Admission: RE | Admit: 2021-09-14 | Payer: Medicare PPO | Source: Ambulatory Visit

## 2021-09-21 ENCOUNTER — Ambulatory Visit: Payer: Medicare PPO

## 2021-09-24 ENCOUNTER — Ambulatory Visit: Payer: Medicare PPO

## 2021-09-24 ENCOUNTER — Ambulatory Visit: Admission: RE | Admit: 2021-09-24 | Payer: Medicare PPO | Source: Ambulatory Visit

## 2021-09-29 ENCOUNTER — Ambulatory Visit: Payer: Medicare PPO

## 2021-10-20 ENCOUNTER — Telehealth: Payer: Self-pay | Admitting: Family Medicine

## 2021-10-20 NOTE — Telephone Encounter (Signed)
Copied from CRM [id number]. Topic: General - Other >> Oct 20, 2021 11:29 AM Esperanza Sheets wrote: Rep w/ [facility] states patient has an appointment for ct scan on 7-21, patient's authorization expired on 7-9 and a new one is required  Please assist further [phone] ext [phone]

## 2021-10-22 ENCOUNTER — Ambulatory Visit: Admission: RE | Admit: 2021-10-22 | Payer: Medicare PPO | Source: Ambulatory Visit

## 2021-10-22 ENCOUNTER — Inpatient Hospital Stay: Admission: RE | Admit: 2021-10-22 | Payer: Medicare PPO | Source: Ambulatory Visit

## 2021-11-12 ENCOUNTER — Other Ambulatory Visit: Payer: Self-pay

## 2021-11-12 DIAGNOSIS — F17200 Nicotine dependence, unspecified, uncomplicated: Secondary | ICD-10-CM

## 2021-11-12 DIAGNOSIS — Z87891 Personal history of nicotine dependence: Secondary | ICD-10-CM

## 2021-11-12 DIAGNOSIS — Z122 Encounter for screening for malignant neoplasm of respiratory organs: Secondary | ICD-10-CM

## 2021-11-22 ENCOUNTER — Ambulatory Visit: Admission: RE | Admit: 2021-11-22 | Payer: Medicare PPO | Source: Ambulatory Visit

## 2021-12-20 NOTE — Progress Notes (Deleted)
Name: Stephen [name]   MRN: [id number]    DOB: 09-Jul-1955   Date:12/20/2021       Progress Note  Subjective  Chief Complaint  Annual Exam  HPI  Patient presents for annual CPE.  IPSS Questionnaire (AUA-7): Over the past month.   1)  How often have you had a sensation of not emptying your bladder completely after you finish urinating?  {Rating:19227}  2)  How often have you had to urinate again less than two hours after you finished urinating? {Rating:19227}  3)  How often have you found you stopped and started again several times when you urinated?  {Rating:19227}  4) How difficult have you found it to postpone urination?  {Rating:19227}  5) How often have you had a weak urinary stream?  {Rating:19227}  6) How often have you had to push or strain to begin urination?  {Rating:19227}  7) How many times did you most typically get up to urinate from the time you went to bed until the time you got up in the morning?  {Rating:19228}  Total score:  0-7 mildly symptomatic   8-19 moderately symptomatic   20-35 severely symptomatic     Diet: *** Exercise: *** Last Dental Exam: **** Last Eye Exam: ***  Depression: phq 9 is {gen pos neg:315643}    08/25/2021    9:56 AM 07/08/2021    1:15 PM 11/17/2020    2:18 PM 04/27/2020    8:05 AM 04/20/2020    1:56 PM  Depression screen PHQ 2/9  Decreased Interest 0 0 0 0 0  Down, Depressed, Hopeless 0 0 0 0 0  PHQ - 2 Score 0 0 0 0 0  Altered sleeping 0      Tired, decreased energy 0      Change in appetite 0      Feeling bad or failure about yourself  0      Trouble concentrating 0      Moving slowly or fidgety/restless 0      Suicidal thoughts 0      PHQ-9 Score 0        Hypertension:  BP Readings from Last 3 Encounters:  08/25/21 136/82  11/17/20 134/80  04/14/20 130/84    Obesity: Wt Readings from Last 3 Encounters:  08/25/21 202 lb (91.6 kg)  11/17/20 200 lb (90.7 kg)  09/15/20 206 lb (93.4 kg)   BMI Readings from Last 3  Encounters:  08/25/21 27.40 kg/m  11/17/20 27.12 kg/m  09/15/20 27.94 kg/m     Lipids:  Lab Results  Component Value Date   CHOL 177 08/25/2021   CHOL 162 08/12/2019   Lab Results  Component Value Date   HDL 50 08/25/2021   HDL 40 08/12/2019   Lab Results  Component Value Date   LDLCALC 111 (H) 08/25/2021   LDLCALC 93 08/12/2019   Lab Results  Component Value Date   TRIG 71 08/25/2021   TRIG 202 (H) 08/12/2019   Lab Results  Component Value Date   CHOLHDL 3.5 08/25/2021   CHOLHDL 4.1 08/12/2019   No results found for: "LDLDIRECT" Glucose:  Glucose  Date Value Ref Range Status  04/27/2020 111 (H) 65 - 99 mg/dL Final   Glucose, Bld  Date Value Ref Range Status  08/25/2021 97 65 - 99 mg/dL Final    Comment:    .            Fasting reference interval .   08/12/2019 74 65 - 99  mg/dL Final    Comment:    .            Fasting reference interval .   05/18/2018 109 (H) 70 - 99 mg/dL Final    Flowsheet Row Clinical Support [facility]/08/2021 [facility]  AUDIT-C Score 0      Married STD testing and prevention (HIV/chl/gon/syphilis): N/A Sexual history:  Hep C Screening: 08/25/21 Skin cancer: Discussed monitoring for atypical lesions Colorectal cancer: N/A Prostate cancer:   Lab Results  Component Value Date   PSA 0.08 08/25/2021     Lung cancer:  Low Dose CT Chest recommended if Age 37-80 years, 30 pack-year currently smoking OR have quit w/in 15years. Patient  no a candidate for screening   AAA: The USPSTF recommends one-time screening with ultrasonography in men ages 70 to 75 years who have ever smoked. Patient   no, a candidate for screening  ECG:  03/15/18  Vaccines:   HPV: N/A Tdap: N/A Shingrix: N/A Pneumonia: up to date Flu: N/A COVID-19: N/A  Advanced Care Planning: A voluntary discussion about advance care planning including the explanation and discussion of advance directives.  Discussed health care proxy and  Living will, and the patient was able to identify a health care proxy as wife.  Patient does not have a living will and power of attorney of health care   Patient Active Problem List   Diagnosis Date Noted   B12 deficiency 08/25/2021   Hyperglycemia 08/25/2021   Lower urinary tract symptoms (LUTS) 08/25/2021   Coronary artery calcification of native artery 08/25/2021   Hematuria, microscopic 08/25/2021   Atypical pigmented lesion 08/25/2021   Senile purpura ([facility]) 11/17/2020   Hepatic cyst 10/01/2020   Smoker 08/18/2020   Thoracic aorta atherosclerosis ([facility]) 04/27/2020   Current every day smoker 05/18/2016   Other emphysema ([facility]) 11/18/2015    No past surgical history on file.  Family History  Problem Relation Age of Onset   Hypertension Mother    Hypothyroidism Mother    Alzheimer's disease Father     Social History   Socioeconomic History   Marital status: Married    Spouse name: Not on file   Number of children: Not on file   Years of education: Not on file   Highest education level: Not on file  Occupational History   Not on file  Tobacco Use   Smoking status: Every Day    Packs/day: 1.00    Years: 45.00    Total pack years: 45.00    Types: Cigarettes   Smokeless tobacco: Never  Vaping Use   Vaping Use: Never used  Substance and Sexual Activity   Alcohol use: No    Alcohol/week: 0.0 standard drinks of alcohol   Drug use: No   Sexual activity: Not Currently  Other Topics Concern   Not on file  Social History Narrative   Not on file   Social Determinants of Health   Financial Resource Strain: Low Risk  (07/08/2021)   Overall Financial Resource Strain (CARDIA)    Difficulty of Paying Living Expenses: Not hard at all  Food Insecurity: No Food Insecurity (07/08/2021)   Hunger Vital Sign    Worried About Running Out of Food in the Last Year: Never true    Ran Out of Food in the Last Year: Never true  Transportation Needs: No Transportation Needs (07/08/2021)    PRAPARE - Transportation    Lack of Transportation (Medical): No    Lack of  Transportation (Non-Medical): No  Physical Activity: Inactive (07/08/2021)   Exercise Vital Sign    Days of Exercise per Week: 0 days    Minutes of Exercise per Session: 0 min  Stress: No Stress Concern Present (07/08/2021)   [employer] of Occupational Health - Occupational Stress Questionnaire    Feeling of Stress : Not at all  Social Connections: Moderately Isolated (07/08/2021)   Social Connection and Isolation Panel [NHANES]    Frequency of Communication with Friends and Family: More than three times a week    Frequency of Social Gatherings with Friends and Family: Twice a week    Attends Religious Services: Never    Database administrator or Organizations: No    Attends Banker Meetings: Never    Marital Status: Married  Catering manager Violence: Not At Risk (07/08/2021)   Humiliation, Afraid, Rape, and Kick questionnaire    Fear of Current or Ex-Partner: No    Emotionally Abused: No    Physically Abused: No    Sexually Abused: No     Current Outpatient Medications:    albuterol (VENTOLIN HFA) 108 (90 Base) MCG/ACT inhaler, INHALE 2 PUFFS INTO THE LUNGS EVERY 6 HOURS AS NEEDED FOR WHEEZING ORSHORTNESS OF BREATH, Disp: 18 g, Rfl: 1   aspirin EC 81 MG tablet, Take 1 tablet (81 mg total) by mouth daily. Swallow whole., Disp: 30 tablet, Rfl: 12   Budeson-Glycopyrrol-Formoterol (BREZTRI AEROSPHERE) 160-9-4.8 MCG/ACT AERO, Inhale 2 puffs into the lungs in the morning and at bedtime., Disp: 32.1 g, Rfl: 3   rosuvastatin (CRESTOR) 10 MG tablet, Take 1 tablet (10 mg total) by mouth daily., Disp: 90 tablet, Rfl: 3   tiZANidine (ZANAFLEX) 2 MG tablet, Take 1-2 tablets (2-4 mg total) by mouth every 6 (six) hours as needed for muscle spasms., Disp: 60 tablet, Rfl: 0  No Known Allergies   ROS  ***   Objective  There were no vitals filed for this visit.  There is no height or weight on file to  calculate BMI.  Physical Exam ***  No results found for this or any previous visit (from the past 2160 hour(s)).   Fall Risk:    08/25/2021    9:54 AM 07/08/2021    1:19 PM 11/17/2020    2:18 PM 04/27/2020    8:05 AM 04/20/2020    1:56 PM  Fall Risk   Falls in the past year? 0 0 0 0 0  Number falls in past yr: 0 0 0 0 0  Injury with Fall? 0 0 0 0 0  Risk for fall due to : No Fall Risks No Fall Risks     Follow up Falls prevention discussed Falls prevention discussed        Functional Status Survey:      Assessment & Plan  1. Well adult exam ***    -Prostate cancer screening and PSA options (with potential risks and benefits of testing vs not testing) were discussed along with recent recs/guidelines. -USPSTF grade A and B recommendations reviewed with patient; age-appropriate recommendations, preventive care, screening tests, etc discussed and encouraged; healthy living encouraged; see AVS for patient education given to patient -Discussed importance of 150 minutes of physical activity weekly, eat two servings of fish weekly, eat one serving of tree nuts ( cashews, pistachios, pecans, almonds.Marland Kitchen) every other day, eat 6 servings of fruit/vegetables daily and drink plenty of water and avoid sweet beverages.  -Reviewed Health Maintenance: yes

## 2021-12-21 ENCOUNTER — Encounter: Payer: Medicare PPO | Admitting: Family Medicine

## 2022-01-11 ENCOUNTER — Ambulatory Visit: Payer: Medicare PPO | Admitting: Family Medicine

## 2022-01-11 NOTE — Progress Notes (Deleted)
Name: Stephen [name]   MRN: [id number]    DOB: 07/11/1955   Date:01/11/2022       Progress Note  Subjective  Chief Complaint  Follow Up  HPI  Emphysema:he started smoking at age 66 and smokes on average one pack daily, not ready to quit.  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 He has to stop or use rescue inhaler to improve symptoms. He is getting Brezti from [employer]    Right ear fullness: he also noticed some drainage in am's, feels muffled  Atherosclerosis of aorta: on his CT chest seen 05/19/2019 he also has coronary calcifications - discussed starting aspirin 81 mg and statin therapy , today he is wiling to try taking cholesterol medication  B12 deficiency: he stopped taking supplementation months , he has noticed numbness on all toes and now migrating to mid foot. No weakness  DDD lumbar spine: he had CT abdomen that showed DDD L4-S1 and large protusion L5-S1, he has been taking ibuprofen four of otc tablets every night, discussed risk of NSAID's and we will try muscle relaxer instead and Tylenol   Left side neck pain: intermittent pain, started end of 2022, no trauma, almost daily, worse with palpation and rom of neck, no radiculitis.   Senile purpura: on both arms, reassurance given . Explained it will get worse with aspirin   Patient Active Problem List   Diagnosis Date Noted   B12 deficiency 08/25/2021   Hyperglycemia 08/25/2021   Lower urinary tract symptoms (LUTS) 08/25/2021   Coronary artery calcification of native artery 08/25/2021   Hematuria, microscopic 08/25/2021   Atypical pigmented lesion 08/25/2021   Senile purpura ([place]) 11/17/2020   Hepatic cyst 10/01/2020   Smoker 08/18/2020   Thoracic aorta atherosclerosis ([place]) 04/27/2020   Current every day smoker 05/18/2016    Other emphysema ([place]) 11/18/2015    No past surgical history on file.  Family History  Problem Relation Age of Onset   Hypertension Mother    Hypothyroidism Mother    Alzheimer's disease Father     Social History   Tobacco Use   Smoking status: Every Day    Packs/day: 1.00    Years: 45.00    Total pack years: 45.00    Types: Cigarettes   Smokeless tobacco: Never  Substance Use Topics   Alcohol use: No    Alcohol/week: 0.0 standard drinks of alcohol     Current Outpatient Medications:    albuterol (VENTOLIN HFA) 108 (90 Base) MCG/ACT inhaler, INHALE 2 PUFFS INTO THE LUNGS EVERY 6 HOURS AS NEEDED FOR WHEEZING ORSHORTNESS OF BREATH, Disp: 18 g, Rfl: 1   aspirin EC 81 MG tablet, Take 1 tablet (81 mg total) by mouth daily. Swallow whole., Disp: 30 tablet, Rfl: 12   Budeson-Glycopyrrol-Formoterol (BREZTRI AEROSPHERE) 160-9-4.8 MCG/ACT AERO, Inhale 2 puffs into the lungs in the morning and at bedtime., Disp: 32.1 g, Rfl: 3   rosuvastatin (CRESTOR) 10 MG tablet, Take 1 tablet (10 mg total) by mouth daily., Disp: 90 tablet, Rfl: 3   tiZANidine (ZANAFLEX) 2 MG tablet, Take 1-2 tablets (2-4 mg total) by mouth every 6 (six) hours as needed for muscle spasms., Disp: 60 tablet, Rfl: 0  No Known Allergies  I personally reviewed active problem list, medication list, allergies, family history,  social history, health maintenance with the patient/caregiver today.   ROS  ***  Objective  There were no vitals filed for this visit.  There is no height or weight on file to calculate BMI.  Physical Exam ***  No results found for this or any previous visit (from the past 2160 hour(s)).   PHQ2/9:    08/25/2021    9:56 AM 07/08/2021    1:15 PM 11/17/2020    2:18 PM 04/27/2020    8:05 AM 04/20/2020    1:56 PM  Depression screen PHQ 2/9  Decreased Interest 0 0 0 0 0  Down, Depressed, Hopeless 0 0 0 0 0  PHQ - 2 Score 0 0 0 0 0  Altered sleeping 0      Tired, decreased energy 0       Change in appetite 0      Feeling bad or failure about yourself  0      Trouble concentrating 0      Moving slowly or fidgety/restless 0      Suicidal thoughts 0      PHQ-9 Score 0        phq 9 is {gen pos [id number]}   Fall Risk:    08/25/2021    9:54 AM 07/08/2021    1:19 PM 11/17/2020    2:18 PM 04/27/2020    8:05 AM 04/20/2020    1:56 PM  Fall Risk   Falls in the past year? 0 0 0 0 0  Number falls in past yr: 0 0 0 0 0  Injury with Fall? 0 0 0 0 0  Risk for fall due to : No Fall Risks No Fall Risks     Follow up Falls prevention discussed Falls prevention discussed         Functional Status Survey:      Assessment & Plan  *** There are no diagnoses linked to this encounter.

## 2022-01-12 ENCOUNTER — Ambulatory Visit: Payer: Medicare PPO | Admitting: Family Medicine

## 2022-02-28 NOTE — Progress Notes (Deleted)
Name: Stephen [name]   MRN: [id number]    DOB: Mar 17, 1956   Date:02/28/2022       Progress Note  Subjective  Chief Complaint  Follow Up  HPI  Emphysema:he started smoking at age 66 and smokes on average one pack daily, not ready to quit.  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 He has to stop or use rescue inhaler to improve symptoms. He is getting Brezti from [employer]    Right ear fullness: he also noticed some drainage in am's, feels muffled  Atherosclerosis of aorta: on his CT chest seen 05/19/2019 he also has coronary calcifications - discussed starting aspirin 81 mg and statin therapy , today he is wiling to try taking cholesterol medication  B12 deficiency: he stopped taking supplementation months , he has noticed numbness on all toes and now migrating to mid foot. No weakness  DDD lumbar spine: he had CT abdomen that showed DDD L4-S1 and large protusion L5-S1, he has been taking ibuprofen four of otc tablets every night, discussed risk of NSAID's and we will try muscle relaxer instead and Tylenol   Left side neck pain: intermittent pain, started end of 2022, no trauma, almost daily, worse with palpation and rom of neck, no radiculitis.   Senile purpura: on both arms, reassurance given . Explained it will get worse with aspirin   Patient Active Problem List   Diagnosis Date Noted   B12 deficiency 08/25/2021   Hyperglycemia 08/25/2021   Lower urinary tract symptoms (LUTS) 08/25/2021   Coronary artery calcification of native artery 08/25/2021   Hematuria, microscopic 08/25/2021   Atypical pigmented lesion 08/25/2021   Senile purpura ([facility]) 11/17/2020   Hepatic cyst 10/01/2020   Smoker 08/18/2020   Thoracic aorta atherosclerosis ([facility]) 04/27/2020   Current every day smoker 05/18/2016    Other emphysema ([facility]) 11/18/2015    No past surgical history on file.  Family History  Problem Relation Age of Onset   Hypertension Mother    Hypothyroidism Mother    Alzheimer's disease Father     Social History   Tobacco Use   Smoking status: Every Day    Packs/day: 1.00    Years: 45.00    Total pack years: 45.00    Types: Cigarettes   Smokeless tobacco: Never  Substance Use Topics   Alcohol use: No    Alcohol/week: 0.0 standard drinks of alcohol     Current Outpatient Medications:    albuterol (VENTOLIN HFA) 108 (90 Base) MCG/ACT inhaler, INHALE 2 PUFFS INTO THE LUNGS EVERY 6 HOURS AS NEEDED FOR WHEEZING ORSHORTNESS OF BREATH, Disp: 18 g, Rfl: 1   aspirin EC 81 MG tablet, Take 1 tablet (81 mg total) by mouth daily. Swallow whole., Disp: 30 tablet, Rfl: 12   Budeson-Glycopyrrol-Formoterol (BREZTRI AEROSPHERE) 160-9-4.8 MCG/ACT AERO, Inhale 2 puffs into the lungs in the morning and at bedtime., Disp: 32.1 g, Rfl: 3   rosuvastatin (CRESTOR) 10 MG tablet, Take 1 tablet (10 mg total) by mouth daily., Disp: 90 tablet, Rfl: 3   tiZANidine (ZANAFLEX) 2 MG tablet, Take 1-2 tablets (2-4 mg total) by mouth every 6 (six) hours as needed for muscle spasms., Disp: 60 tablet, Rfl: 0  No Known Allergies  I personally reviewed active problem list, medication list, allergies, family history,  social history, health maintenance with the patient/caregiver today.   ROS  ***  Objective  There were no vitals filed for this visit.  There is no height or weight on file to calculate BMI.  Physical Exam ***  No results found for this or any previous visit (from the past 2160 hour(s)).   PHQ2/9:    08/25/2021    9:56 AM 07/08/2021    1:15 PM 11/17/2020    2:18 PM 04/27/2020    8:05 AM 04/20/2020    1:56 PM  Depression screen PHQ 2/9  Decreased Interest 0 0 0 0 0  Down, Depressed, Hopeless 0 0 0 0 0  PHQ - 2 Score 0 0 0 0 0  Altered sleeping 0      Tired, decreased energy 0       Change in appetite 0      Feeling bad or failure about yourself  0      Trouble concentrating 0      Moving slowly or fidgety/restless 0      Suicidal thoughts 0      PHQ-9 Score 0        phq 9 is {gen pos [id number]}   Fall Risk:    08/25/2021    9:54 AM 07/08/2021    1:19 PM 11/17/2020    2:18 PM 04/27/2020    8:05 AM 04/20/2020    1:56 PM  Fall Risk   Falls in the past year? 0 0 0 0 0  Number falls in past yr: 0 0 0 0 0  Injury with Fall? 0 0 0 0 0  Risk for fall due to : No Fall Risks No Fall Risks     Follow up Falls prevention discussed Falls prevention discussed         Functional Status Survey:      Assessment & Plan  *** There are no diagnoses linked to this encounter.

## 2022-03-01 ENCOUNTER — Ambulatory Visit: Payer: Medicare PPO | Admitting: Family Medicine

## 2022-03-21 NOTE — Progress Notes (Deleted)
Name: Stephen [name]   MRN: [id number]    DOB: 1955-04-25   Date:03/21/2022       Progress Note  Subjective  Chief Complaint  Follow Up  HPI  Emphysema:he started smoking at age 66 and smokes on average one pack daily, not ready to quit.  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 He has to stop or use rescue inhaler to improve symptoms. He is getting Brezti from [employer]    Right ear fullness: he also noticed some drainage in am's, feels muffled  Atherosclerosis of aorta: on his CT chest seen 05/19/2019 he also has coronary calcifications - discussed starting aspirin 81 mg and statin therapy , today he is wiling to try taking cholesterol medication  B12 deficiency: he stopped taking supplementation months , he has noticed numbness on all toes and now migrating to mid foot. No weakness  DDD lumbar spine: he had CT abdomen that showed DDD L4-S1 and large protusion L5-S1, he has been taking ibuprofen four of otc tablets every night, discussed risk of NSAID's and we will try muscle relaxer instead and Tylenol   Left side neck pain: intermittent pain, started end of 2022, no trauma, almost daily, worse with palpation and rom of neck, no radiculitis.   Senile purpura: on both arms, reassurance given . Explained it will get worse with aspirin   Patient Active Problem List   Diagnosis Date Noted   B12 deficiency 08/25/2021   Hyperglycemia 08/25/2021   Lower urinary tract symptoms (LUTS) 08/25/2021   Coronary artery calcification of native artery 08/25/2021   Hematuria, microscopic 08/25/2021   Atypical pigmented lesion 08/25/2021   Senile purpura ([facility]) 11/17/2020   Hepatic cyst 10/01/2020   Smoker 08/18/2020   Thoracic aorta atherosclerosis ([facility]) 04/27/2020   Current every day smoker 05/18/2016    Other emphysema ([facility]) 11/18/2015    No past surgical history on file.  Family History  Problem Relation Age of Onset   Hypertension Mother    Hypothyroidism Mother    Alzheimer's disease Father     Social History   Tobacco Use   Smoking status: Every Day    Packs/day: 1.00    Years: 45.00    Total pack years: 45.00    Types: Cigarettes   Smokeless tobacco: Never  Substance Use Topics   Alcohol use: No    Alcohol/week: 0.0 standard drinks of alcohol     Current Outpatient Medications:    albuterol (VENTOLIN HFA) 108 (90 Base) MCG/ACT inhaler, INHALE 2 PUFFS INTO THE LUNGS EVERY 6 HOURS AS NEEDED FOR WHEEZING ORSHORTNESS OF BREATH, Disp: 18 g, Rfl: 1   aspirin EC 81 MG tablet, Take 1 tablet (81 mg total) by mouth daily. Swallow whole., Disp: 30 tablet, Rfl: 12   Budeson-Glycopyrrol-Formoterol (BREZTRI AEROSPHERE) 160-9-4.8 MCG/ACT AERO, Inhale 2 puffs into the lungs in the morning and at bedtime., Disp: 32.1 g, Rfl: 3   rosuvastatin (CRESTOR) 10 MG tablet, Take 1 tablet (10 mg total) by mouth daily., Disp: 90 tablet, Rfl: 3   tiZANidine (ZANAFLEX) 2 MG tablet, Take 1-2 tablets (2-4 mg total) by mouth every 6 (six) hours as needed for muscle spasms., Disp: 60 tablet, Rfl: 0  No Known Allergies  I personally reviewed active problem list, medication list, allergies, family history,  social history, health maintenance with the patient/caregiver today.   ROS  ***  Objective  There were no vitals filed for this visit.  There is no height or weight on file to calculate BMI.  Physical Exam ***  No results found for this or any previous visit (from the past 2160 hour(s)).   PHQ2/9:    08/25/2021    9:56 AM 07/08/2021    1:15 PM 11/17/2020    2:18 PM 04/27/2020    8:05 AM 04/20/2020    1:56 PM  Depression screen PHQ 2/9  Decreased Interest 0 0 0 0 0  Down, Depressed, Hopeless 0 0 0 0 0  PHQ - 2 Score 0 0 0 0 0  Altered sleeping 0      Tired, decreased energy 0       Change in appetite 0      Feeling bad or failure about yourself  0      Trouble concentrating 0      Moving slowly or fidgety/restless 0      Suicidal thoughts 0      PHQ-9 Score 0        phq 9 is {gen pos [id number]}   Fall Risk:    08/25/2021    9:54 AM 07/08/2021    1:19 PM 11/17/2020    2:18 PM 04/27/2020    8:05 AM 04/20/2020    1:56 PM  Fall Risk   Falls in the past year? 0 0 0 0 0  Number falls in past yr: 0 0 0 0 0  Injury with Fall? 0 0 0 0 0  Risk for fall due to : No Fall Risks No Fall Risks     Follow up Falls prevention discussed Falls prevention discussed         Functional Status Survey:      Assessment & Plan  *** There are no diagnoses linked to this encounter.

## 2022-03-22 ENCOUNTER — Ambulatory Visit: Payer: Medicare PPO | Admitting: Family Medicine

## 2022-04-11 ENCOUNTER — Ambulatory Visit: Payer: Medicare PPO | Admitting: Family Medicine

## 2022-04-11 NOTE — Patient Instructions (Incomplete)
Preventive Care 65 Years and Older, Male Preventive care refers to lifestyle choices and visits with your health care provider that can promote health and wellness. Preventive care visits are also called wellness exams. What can I expect for my preventive care visit? Counseling During your preventive care visit, your health care provider may ask about your: Medical history, including: Past medical problems. Family medical history. History of falls. Current health, including: Emotional well-being. Home life and relationship well-being. Sexual activity. Memory and ability to understand (cognition). Lifestyle, including: Alcohol, nicotine or tobacco, and drug use. Access to firearms. Diet, exercise, and sleep habits. Work and work environment. Sunscreen use. Safety issues such as seatbelt and bike helmet use. Physical exam Your health care provider will check your: Height and weight. These may be used to calculate your BMI (body mass index). BMI is a measurement that tells if you are at a healthy weight. Waist circumference. This measures the distance around your waistline. This measurement also tells if you are at a healthy weight and may help predict your risk of certain diseases, such as type 2 diabetes and high blood pressure. Heart rate and blood pressure. Body temperature. Skin for abnormal spots. What immunizations do I need?  Vaccines are usually given at various ages, according to a schedule. Your health care provider will recommend vaccines for you based on your age, medical history, and lifestyle or other factors, such as travel or where you work. What tests do I need? Screening Your health care provider may recommend screening tests for certain conditions. This may include: Lipid and cholesterol levels. Diabetes screening. This is done by checking your blood sugar (glucose) after you have not eaten for a while (fasting). Hepatitis C test. Hepatitis B test. HIV (human  immunodeficiency virus) test. STI (sexually transmitted infection) testing, if you are at risk. Lung cancer screening. Colorectal cancer screening. Prostate cancer screening. Abdominal aortic aneurysm (AAA) screening. You may need this if you are a current or former smoker. Talk with your health care provider about your test results, treatment options, and if necessary, the need for more tests. Follow these instructions at home: Eating and drinking  Eat a diet that includes fresh fruits and vegetables, whole grains, lean protein, and low-fat dairy products. Limit your intake of foods with high amounts of sugar, saturated fats, and salt. Take vitamin and mineral supplements as recommended by your health care provider. Do not drink alcohol if your health care provider tells you not to drink. If you drink alcohol: Limit how much you have to 0-2 drinks a day. Know how much alcohol is in your drink. In the [place] one drink equals one 12 oz bottle of beer (355 mL), one 5 oz glass of wine (148 mL), or one 1 oz glass of hard liquor (44 mL). Lifestyle Brush your teeth every morning and night with fluoride toothpaste. Floss one time each day. Exercise for at least 30 minutes 5 or more days each week. Do not use any products that contain nicotine or tobacco. These products include cigarettes, chewing tobacco, and vaping devices, such as e-cigarettes. If you need help quitting, ask your health care provider. Do not use drugs. If you are sexually active, practice safe sex. Use a condom or other form of protection to prevent STIs. Take aspirin only as told by your health care provider. Make sure that you understand how much to take and what form to take. Work with your health care provider to find out whether it is safe   and beneficial for you to take aspirin daily. Ask your health care provider if you need to take a cholesterol-lowering medicine (statin). Find healthy ways to manage stress, such  as: Meditation, yoga, or listening to music. Journaling. Talking to a trusted person. Spending time with friends and family. Safety Always wear your seat belt while driving or riding in a vehicle. Do not drive: If you have been drinking alcohol. Do not ride with someone who has been drinking. When you are tired or distracted. While texting. If you have been using any mind-altering substances or drugs. Wear a helmet and other protective equipment during sports activities. If you have firearms in your house, make sure you follow all gun safety procedures. Minimize exposure to UV radiation to reduce your risk of skin cancer. What's next? Visit your health care provider once a year for an annual wellness visit. Ask your health care provider how often you should have your eyes and teeth checked. Stay up to date on all vaccines. This information is not intended to replace advice given to you by your health care provider. Make sure you discuss any questions you have with your health care provider. Document Revised: 09/16/2020 Document Reviewed: 09/16/2020 Elsevier Patient Education  2023 [employer]  

## 2022-04-11 NOTE — Progress Notes (Deleted)
Name: Stephen [name]   MRN: [id number]    DOB: 01-13-1956   Date:04/11/2022       Progress Note  Subjective  Chief Complaint  Annual Exam  HPI  Patient presents for annual CPE.  IPSS Questionnaire (AUA-7): Over the past month.   1)  How often have you had a sensation of not emptying your bladder completely after you finish urinating?  {Rating:19227}  2)  How often have you had to urinate again less than two hours after you finished urinating? {Rating:19227}  3)  How often have you found you stopped and started again several times when you urinated?  {Rating:19227}  4) How difficult have you found it to postpone urination?  {Rating:19227}  5) How often have you had a weak urinary stream?  {Rating:19227}  6) How often have you had to push or strain to begin urination?  {Rating:19227}  7) How many times did you most typically get up to urinate from the time you went to bed until the time you got up in the morning?  {Rating:19228}  Total score:  0-7 mildly symptomatic   8-19 moderately symptomatic   20-35 severely symptomatic     Diet: *** Exercise: *** Last Dental Exam: **** Last Eye Exam: ***  Depression: phq 9 is {gen pos neg:315643}    08/25/2021    9:56 AM 07/08/2021    1:15 PM 11/17/2020    2:18 PM 04/27/2020    8:05 AM 04/20/2020    1:56 PM  Depression screen PHQ 2/9  Decreased Interest 0 0 0 0 0  Down, Depressed, Hopeless 0 0 0 0 0  PHQ - 2 Score 0 0 0 0 0  Altered sleeping 0      Tired, decreased energy 0      Change in appetite 0      Feeling bad or failure about yourself  0      Trouble concentrating 0      Moving slowly or fidgety/restless 0      Suicidal thoughts 0      PHQ-9 Score 0        Hypertension:  BP Readings from Last 3 Encounters:  08/25/21 136/82  11/17/20 134/80  04/14/20 130/84    Obesity: Wt Readings from Last 3 Encounters:  08/25/21 202 lb (91.6 kg)  11/17/20 200 lb (90.7 kg)  09/15/20 206 lb (93.4 kg)   BMI Readings from Last 3  Encounters:  08/25/21 27.40 kg/m  11/17/20 27.12 kg/m  09/15/20 27.94 kg/m     Lipids:  Lab Results  Component Value Date   CHOL 177 08/25/2021   CHOL 162 08/12/2019   Lab Results  Component Value Date   HDL 50 08/25/2021   HDL 40 08/12/2019   Lab Results  Component Value Date   LDLCALC 111 (H) 08/25/2021   LDLCALC 93 08/12/2019   Lab Results  Component Value Date   TRIG 71 08/25/2021   TRIG 202 (H) 08/12/2019   Lab Results  Component Value Date   CHOLHDL 3.5 08/25/2021   CHOLHDL 4.1 08/12/2019   No results found for: "LDLDIRECT" Glucose:  Glucose  Date Value Ref Range Status  04/27/2020 111 (H) 65 - 99 mg/dL Final   Glucose, Bld  Date Value Ref Range Status  08/25/2021 97 65 - 99 mg/dL Final    Comment:    .            Fasting reference interval .   08/12/2019 74 65 - 99  mg/dL Final    Comment:    .            Fasting reference interval .   05/18/2018 109 (H) 70 - 99 mg/dL Final    Flowsheet Row Clinical Support from 07/08/2021 in [facility]  AUDIT-C Score 0       Married STD testing and prevention (HIV/chl/gon/syphilis): N/A Sexual history:  Hep C Screening: 08/25/21 Skin cancer: Discussed monitoring for atypical lesions Colorectal cancer: Due, ordered Cologuard today Prostate cancer:   Lab Results  Component Value Date   PSA 0.08 08/25/2021     Lung cancer:  Low Dose CT Chest recommended if Age 38-80 years, 30 pack-year currently smoking OR have quit w/in 15years. Patient  no a candidate for screening   AAA: The USPSTF recommends one-time screening with ultrasonography in men ages 75 to 105 years who have ever smoked. Patient   no, a candidate for screening  ECG:  03/15/18  Vaccines:   HPV: N/A Tdap: Never Shingrix: Never Pneumonia:  up to date Flu: Never COVID-19: Never  Advanced Care Planning: A voluntary discussion about advance care planning including the explanation and discussion of advance directives.   Discussed health care proxy and Living will, and the patient was able to identify a health care proxy as ***.  Patient does not have a living will and power of attorney of health care   Patient Active Problem List   Diagnosis Date Noted   B12 deficiency 08/25/2021   Hyperglycemia 08/25/2021   Lower urinary tract symptoms (LUTS) 08/25/2021   Coronary artery calcification of native artery 08/25/2021   Hematuria, microscopic 08/25/2021   Atypical pigmented lesion 08/25/2021   Senile purpura ([place]) 11/17/2020   Hepatic cyst 10/01/2020   Smoker 08/18/2020   Thoracic aorta atherosclerosis ([place]) 04/27/2020   Current every day smoker 05/18/2016   Other emphysema ([place]) 11/18/2015    No past surgical history on file.  Family History  Problem Relation Age of Onset   Hypertension Mother    Hypothyroidism Mother    Alzheimer's disease Father     Social History   Socioeconomic History   Marital status: Married    Spouse name: Not on file   Number of children: Not on file   Years of education: Not on file   Highest education level: Not on file  Occupational History   Not on file  Tobacco Use   Smoking status: Every Day    Packs/day: 1.00    Years: 45.00    Total pack years: 45.00    Types: Cigarettes   Smokeless tobacco: Never  Vaping Use   Vaping Use: Never used  Substance and Sexual Activity   Alcohol use: No    Alcohol/week: 0.0 standard drinks of alcohol   Drug use: No   Sexual activity: Not Currently  Other Topics Concern   Not on file  Social History Narrative   Not on file   Social Determinants of Health   Financial Resource Strain: Low Risk  (07/08/2021)   Overall Financial Resource Strain (CARDIA)    Difficulty of Paying Living Expenses: Not hard at all  Food Insecurity: No Food Insecurity (07/08/2021)   Hunger Vital Sign    Worried About Running Out of Food in the Last Year: Never true    Ran Out of Food in the Last Year: Never true  Transportation Needs: No  Transportation Needs (07/08/2021)   PRAPARE - Hydrologist (Medical): No  Lack of Transportation (Non-Medical): No  Physical Activity: Inactive (07/08/2021)   Exercise Vital Sign    Days of Exercise per Week: 0 days    Minutes of Exercise per Session: 0 min  Stress: No Stress Concern Present (07/08/2021)   [employer] of Occupational Health - Occupational Stress Questionnaire    Feeling of Stress : Not at all  Social Connections: Moderately Isolated (07/08/2021)   Social Connection and Isolation Panel [NHANES]    Frequency of Communication with Friends and Family: More than three times a week    Frequency of Social Gatherings with Friends and Family: Twice a week    Attends Religious Services: Never    Database administrator or Organizations: No    Attends Banker Meetings: Never    Marital Status: Married  Catering manager Violence: Not At Risk (07/08/2021)   Humiliation, Afraid, Rape, and Kick questionnaire    Fear of Current or Ex-Partner: No    Emotionally Abused: No    Physically Abused: No    Sexually Abused: No     Current Outpatient Medications:    albuterol (VENTOLIN HFA) 108 (90 Base) MCG/ACT inhaler, INHALE 2 PUFFS INTO THE LUNGS EVERY 6 HOURS AS NEEDED FOR WHEEZING ORSHORTNESS OF BREATH, Disp: 18 g, Rfl: 1   aspirin EC 81 MG tablet, Take 1 tablet (81 mg total) by mouth daily. Swallow whole., Disp: 30 tablet, Rfl: 12   Budeson-Glycopyrrol-Formoterol (BREZTRI AEROSPHERE) 160-9-4.8 MCG/ACT AERO, Inhale 2 puffs into the lungs in the morning and at bedtime., Disp: 32.1 g, Rfl: 3   rosuvastatin (CRESTOR) 10 MG tablet, Take 1 tablet (10 mg total) by mouth daily., Disp: 90 tablet, Rfl: 3   tiZANidine (ZANAFLEX) 2 MG tablet, Take 1-2 tablets (2-4 mg total) by mouth every 6 (six) hours as needed for muscle spasms., Disp: 60 tablet, Rfl: 0  No Known Allergies   ROS  ***   Objective  There were no vitals filed for this  visit.  There is no height or weight on file to calculate BMI.  Physical Exam ***  No results found for this or any previous visit (from the past 2160 hour(s)).   Fall Risk:    08/25/2021    9:54 AM 07/08/2021    1:19 PM 11/17/2020    2:18 PM 04/27/2020    8:05 AM 04/20/2020    1:56 PM  Fall Risk   Falls in the past year? 0 0 0 0 0  Number falls in past yr: 0 0 0 0 0  Injury with Fall? 0 0 0 0 0  Risk for fall due to : No Fall Risks No Fall Risks     Follow up Falls prevention discussed Falls prevention discussed        Functional Status Survey:      Assessment & Plan  1. Well adult exam ***    -Prostate cancer screening and PSA options (with potential risks and benefits of testing vs not testing) were discussed along with recent recs/guidelines. -USPSTF grade A and B recommendations reviewed with patient; age-appropriate recommendations, preventive care, screening tests, etc discussed and encouraged; healthy living encouraged; see AVS for patient education given to patient -Discussed importance of 150 minutes of physical activity weekly, eat two servings of fish weekly, eat one serving of tree nuts ( cashews, pistachios, pecans, almonds.Marland Kitchen) every other day, eat 6 servings of fruit/vegetables daily and drink plenty of water and avoid sweet beverages.  -Reviewed Health Maintenance: yes

## 2022-04-12 ENCOUNTER — Ambulatory Visit: Payer: Medicare PPO | Admitting: Family Medicine

## 2022-04-12 DIAGNOSIS — Z1211 Encounter for screening for malignant neoplasm of colon: Secondary | ICD-10-CM

## 2022-04-12 DIAGNOSIS — Z0000 Encounter for general adult medical examination without abnormal findings: Secondary | ICD-10-CM

## 2022-04-17 IMAGING — CT CT ABD-PELV W/O CM
2 of 4 series · 16 of 46 positions shown, 18 images · non-contrast
Comparison: CT chest 05/18/2018

CLINICAL DATA: Microscopic hematuria, right upper quadrant symptoms

EXAM:
CT ABDOMEN AND PELVIS WITHOUT CONTRAST
TECHNIQUE: Multidetector CT imaging of the abdomen and pelvis was performed
following the standard protocol without IV contrast.

[Series 2: axials routine abdomen pelvis without 5.00 · axial · non-contrast · 0.77mm/px · z∈[-1592,-1152]mm · 13 of 98 slices shown, 15 images]
[im 5/98  soft-tissue]
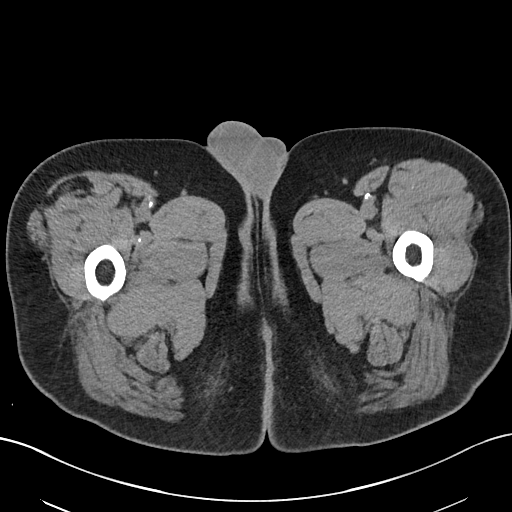
[im 5/98  bone]
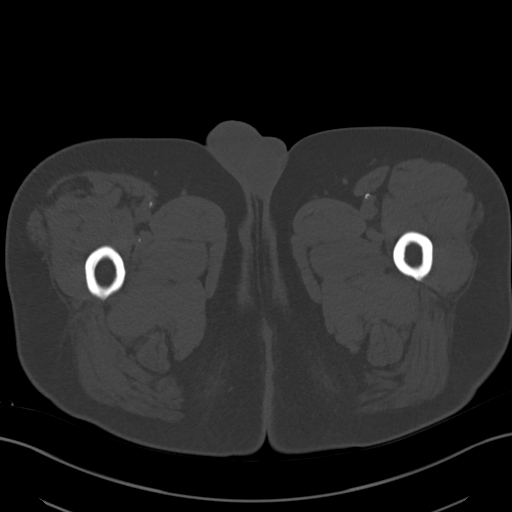
[im 13/98  soft-tissue]
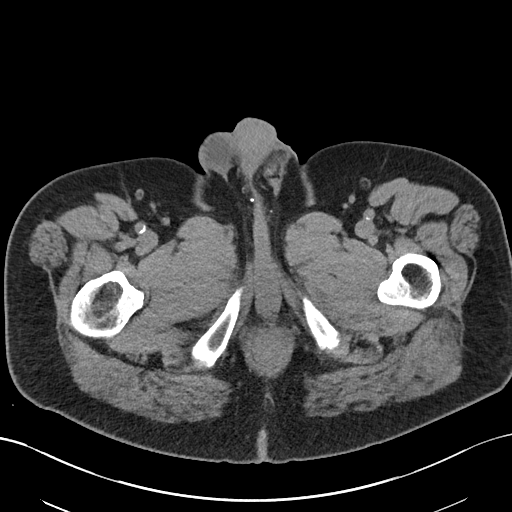
[im 21/98  soft-tissue]
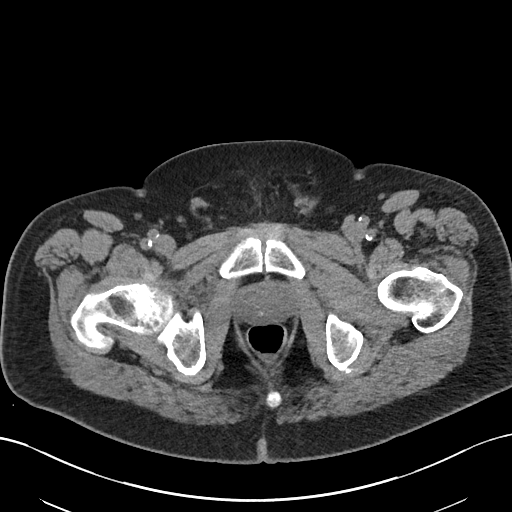
[im 29/98  soft-tissue]
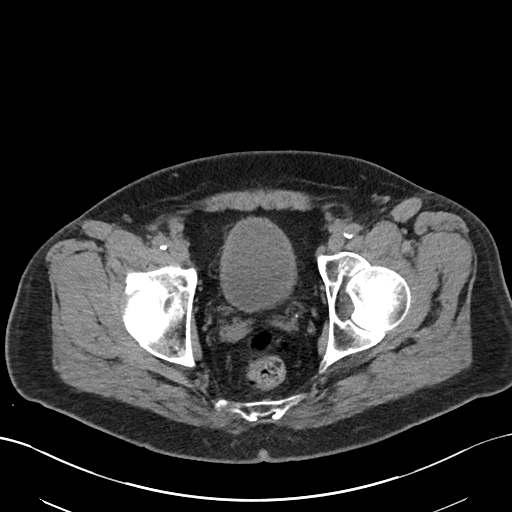
[im 33/98  soft-tissue]
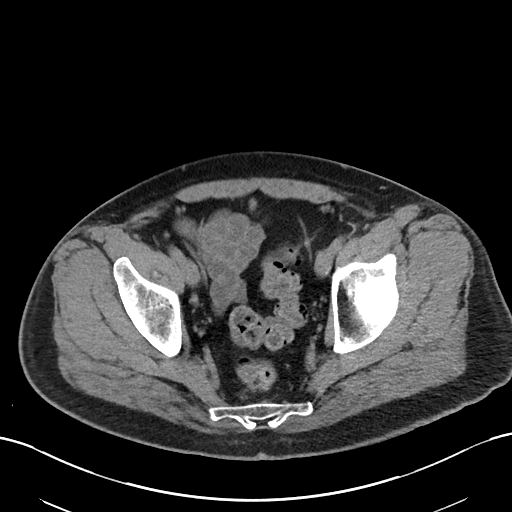
[im 41/98  soft-tissue]
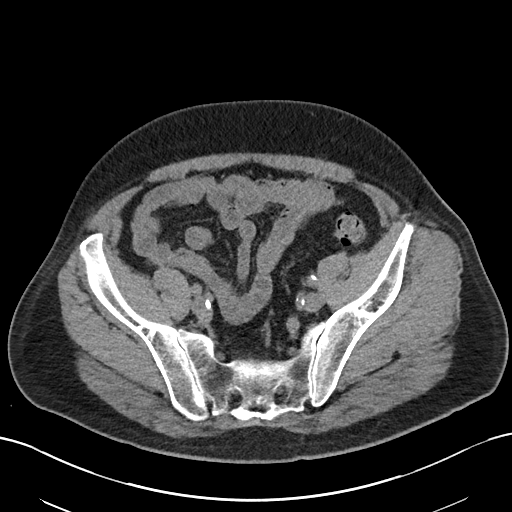
[im 49/98  soft-tissue]
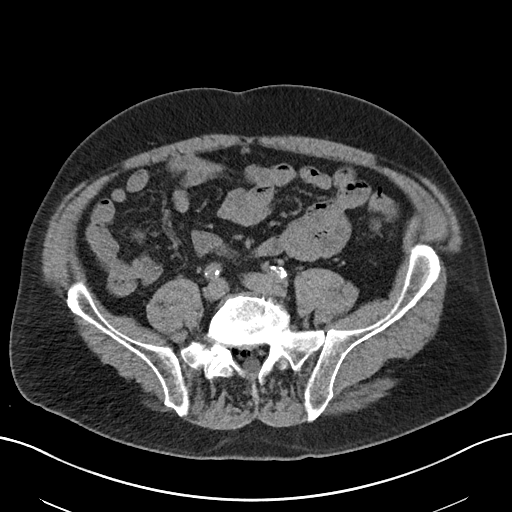
[im 57/98  soft-tissue]
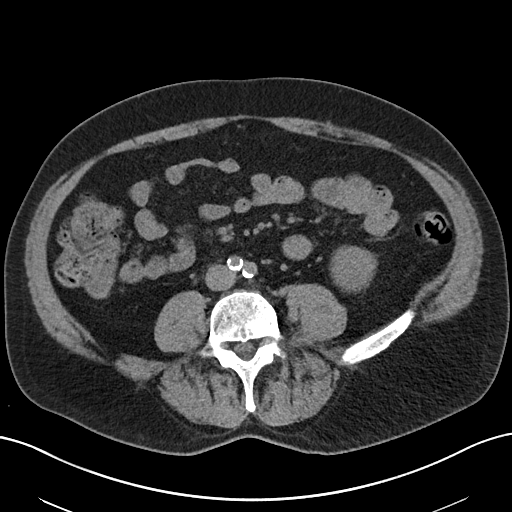
[im 65/98  soft-tissue]
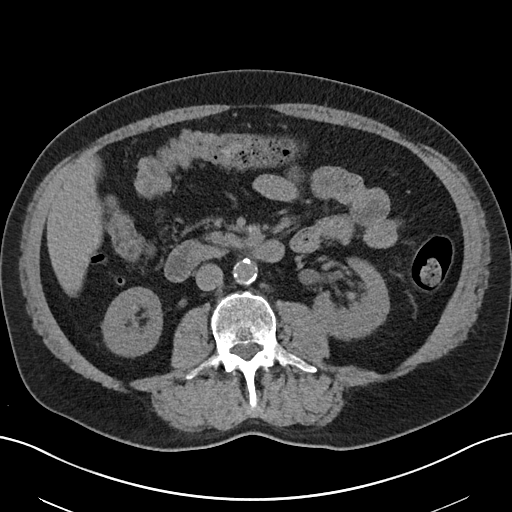
[im 65/98  bone]
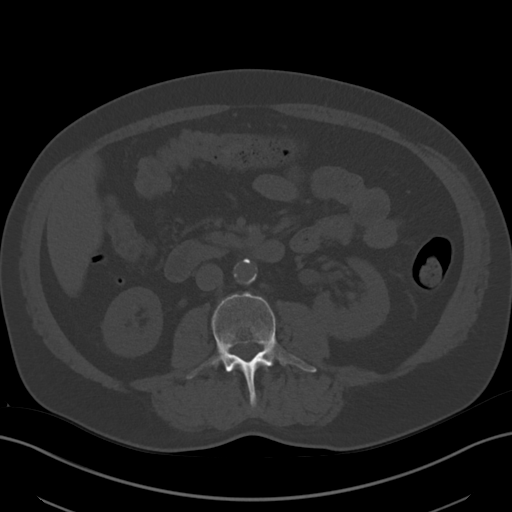
[im 69/98  soft-tissue]
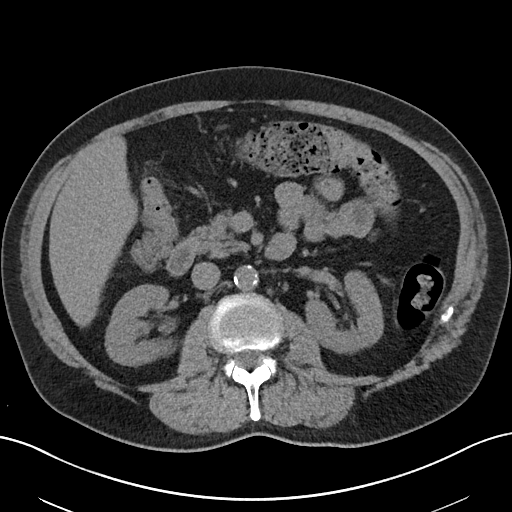
[im 77/98  soft-tissue]
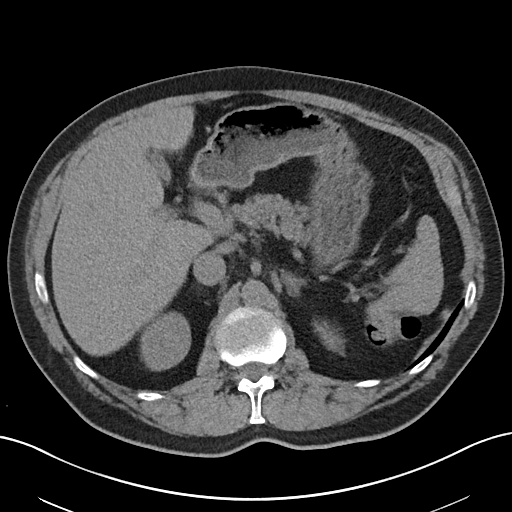
[im 85/98  soft-tissue]
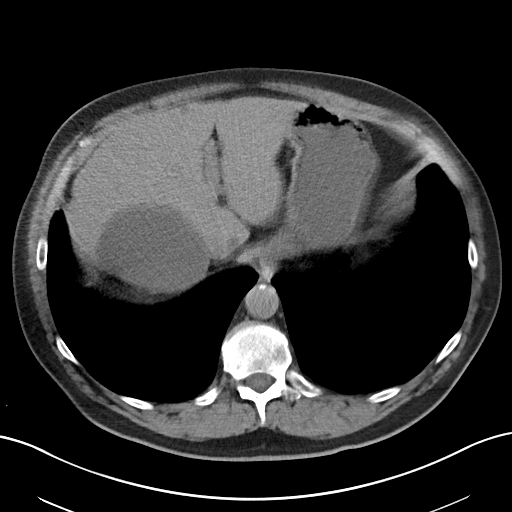
[im 93/98  soft-tissue]
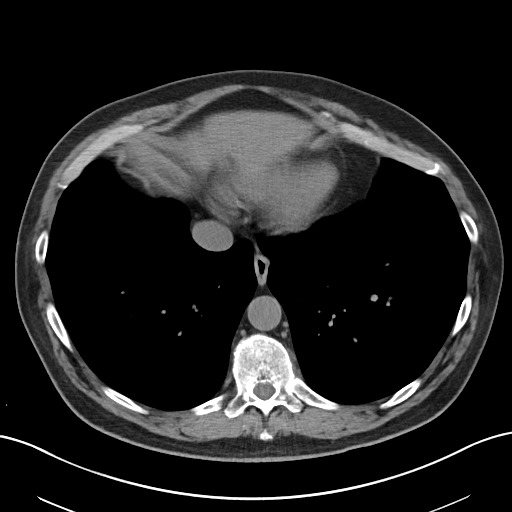

[Series 4: coronals routine abdomen pelvis without 2.00 cor · coronal · non-contrast · 0.77mm/px · 3 of 160 slices shown]
[im 54/160  soft-tissue]
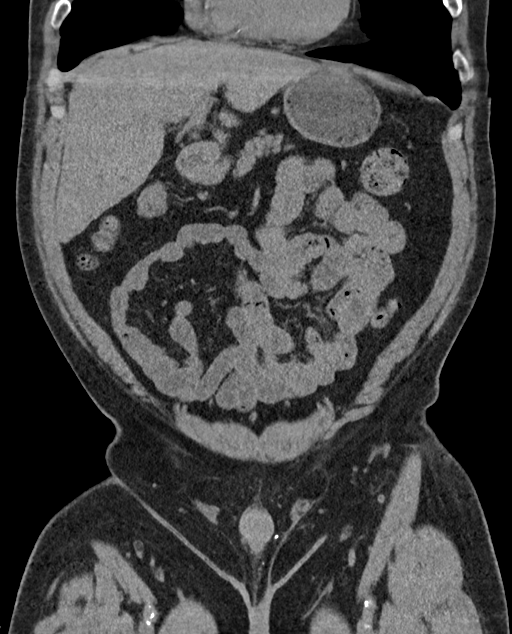
[im 71/160  soft-tissue]
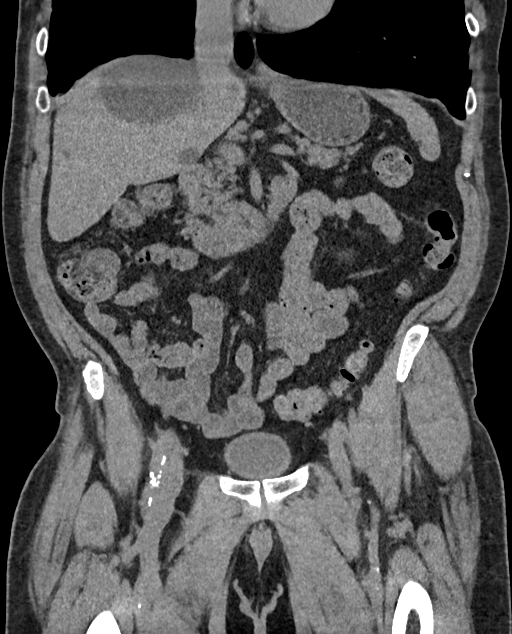
[im 89/160  soft-tissue]
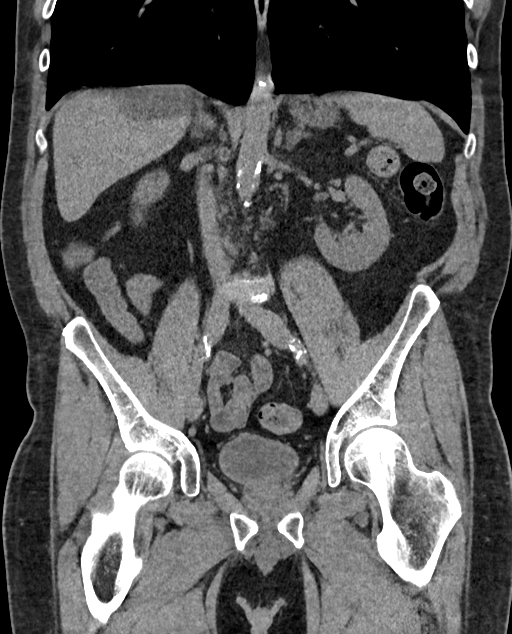

[16 of 46 positions shown; findings below may reference images not displayed]

FINDINGS: Lower chest: Coronary calcifications. No pleural or pericardial
effusion. Pulmonary emphysema.

Hepatobiliary: Stable hepatic cysts, largest 8.9 cm in segment 7
(previously 7.2). Gallbladder decompressed. No biliary ductal
dilatation.

Pancreas: Unremarkable. No pancreatic ductal dilatation or
surrounding inflammatory changes.

Spleen: Normal in size without focal abnormality.

Adrenals/Urinary Tract: Adrenal glands are unremarkable. Kidneys are
normal, without renal calculi, focal lesion, or hydronephrosis.
Bladder is unremarkable.

Stomach/Bowel: Stomach is incompletely distended, unremarkable.
Small bowel decompressed. Normal appendix. The colon is nondilated,
unremarkable.

Vascular/Lymphatic: Coarse aortic and iliofemoral calcified plaque
without aneurysm. No abdominal or pelvic adenopathy.

Reproductive: Prostate is unremarkable.

Other: Right pelvic phleboliths.  No ascites.  No free air.

Musculoskeletal: Degenerative disc disease L4-S1 with a large right
protrusion L5-S1. No fracture or worrisome bone lesion.
IMPRESSION: 1. No urolithiasis, mass or acute finding.
2. Enlarging dominant right hepatic cyst. These can rarely be
associated with symptomatology.
3. Lumbar spondylitic changes as above.
4.  Aortic Atherosclerosis (S15FO-170.0).

## 2022-04-17 IMAGING — CT CT CHEST LUNG CANCER SCREENING LOW DOSE W/O CM
2 of 5 series · 15 of 40 positions shown, 18 images · non-contrast
Comparison: 05/18/2018 diagnostic chest CT. Chest radiograph
04/27/2020 is also reviewed.

CLINICAL DATA: Forty-five pack-year smoking history. Current
smoker.

EXAM:
CT CHEST WITHOUT CONTRAST LOW-DOSE FOR LUNG CANCER SCREENING
TECHNIQUE: Multidetector CT imaging of the chest was performed following the
standard protocol without IV contrast.

[Series 3: lung 1.00 · axial · 0.75mm/px · z∈[-1257,-904]mm · 12 of 389 slices shown, 15 images]
[im 18/389  mediastinal]
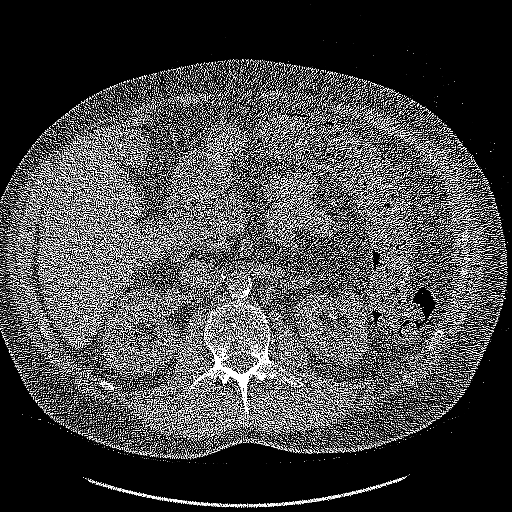
[im 18/389  lung]
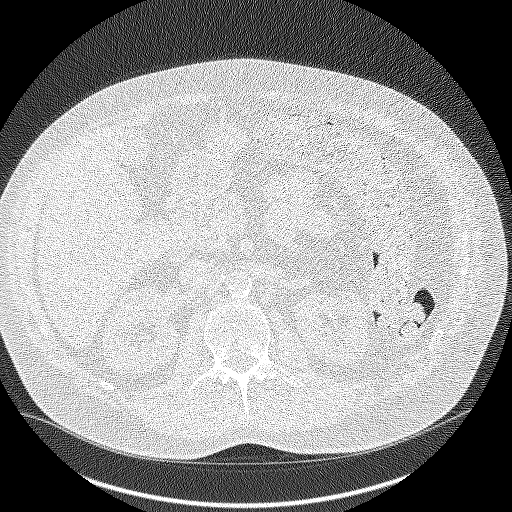
[im 53/389  lung]
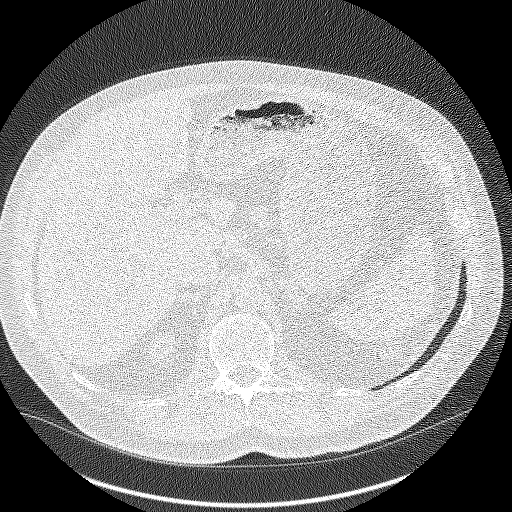
[im 89/389  lung]
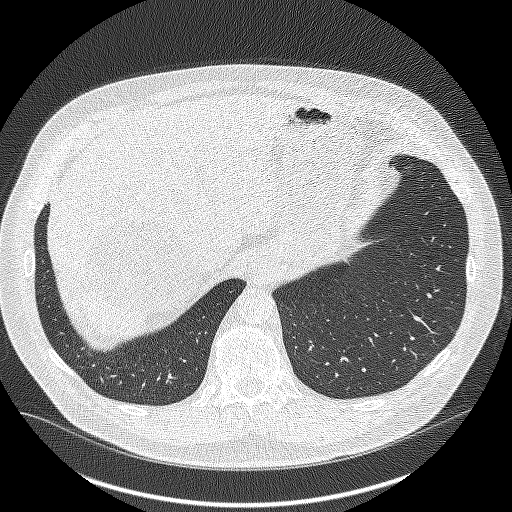
[im 124/389  lung]
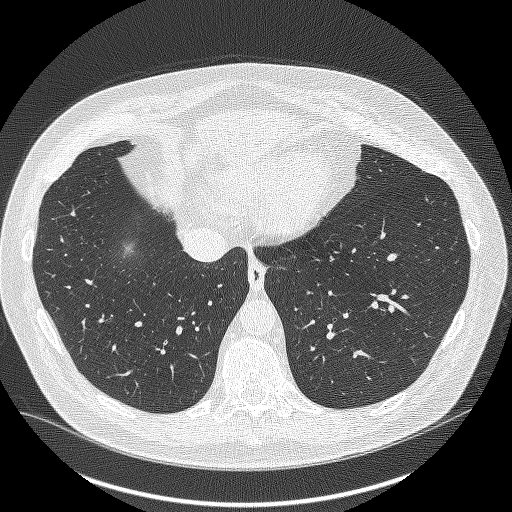
[im 142/389  mediastinal]
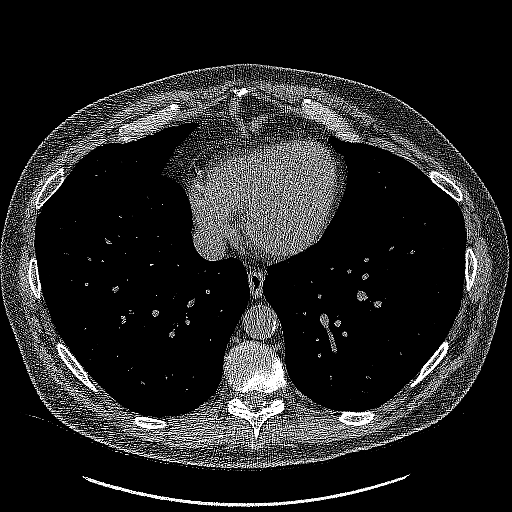
[im 142/389  lung]
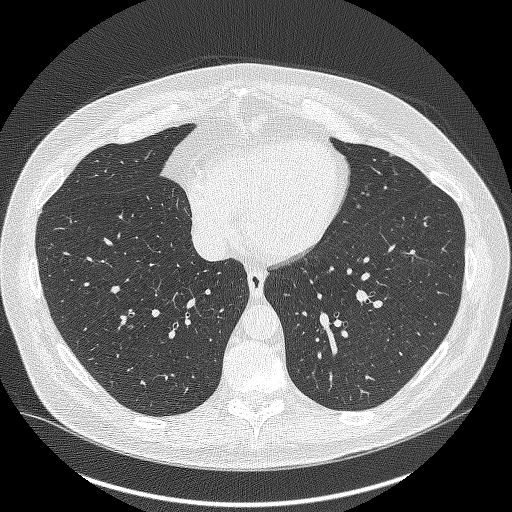
[im 177/389  lung]
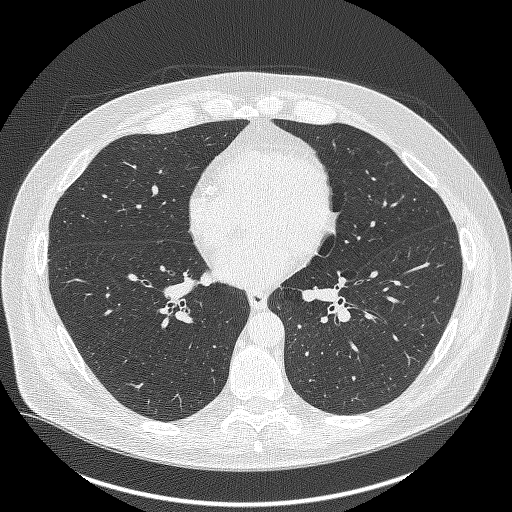
[im 212/389  lung]
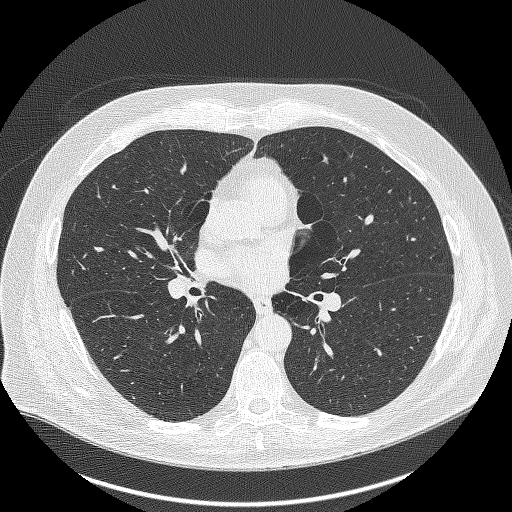
[im 247/389  lung]
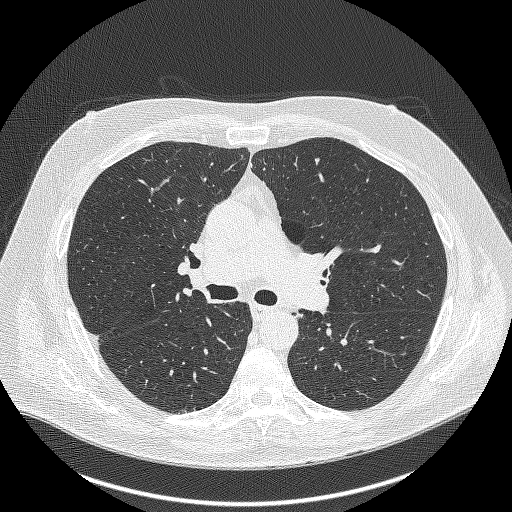
[im 265/389  mediastinal]
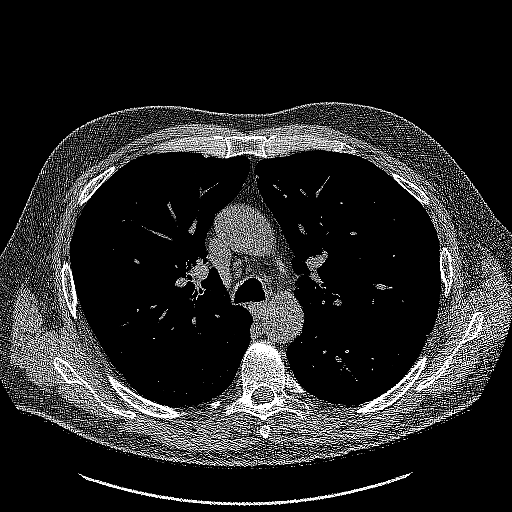
[im 265/389  lung]
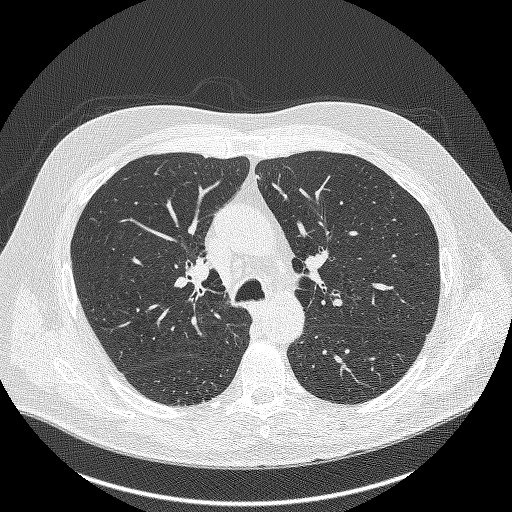
[im 300/389  lung]
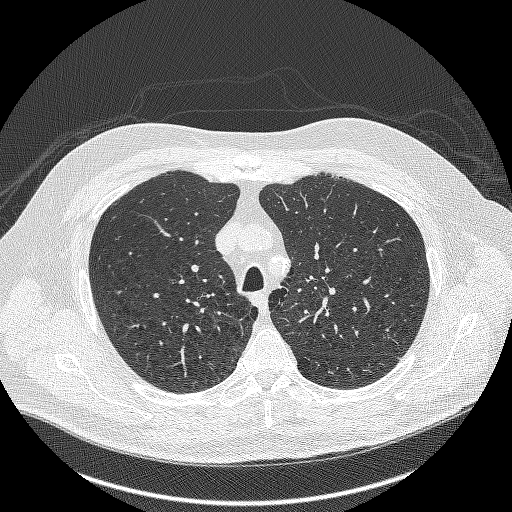
[im 336/389  lung]
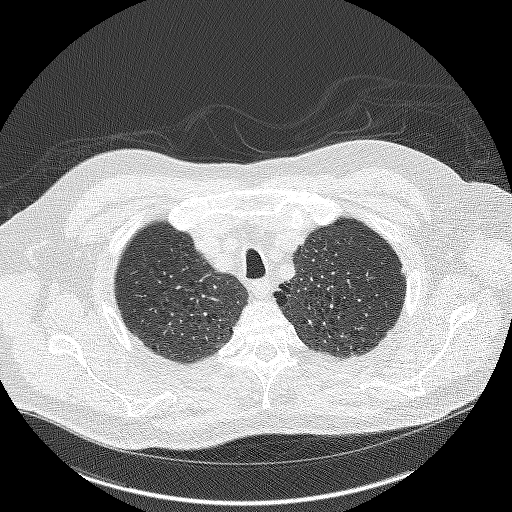
[im 371/389  lung]
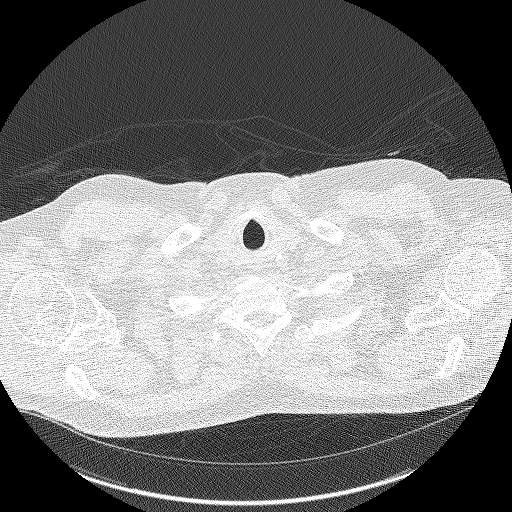

[Series 5: coronals lung 1.00 cor · coronal · 0.75mm/px · 3 of 318 slices shown]
[im 64/318  lung]
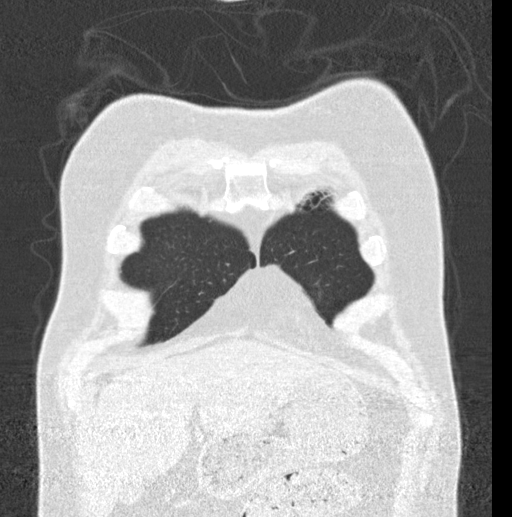
[im 127/318  lung]
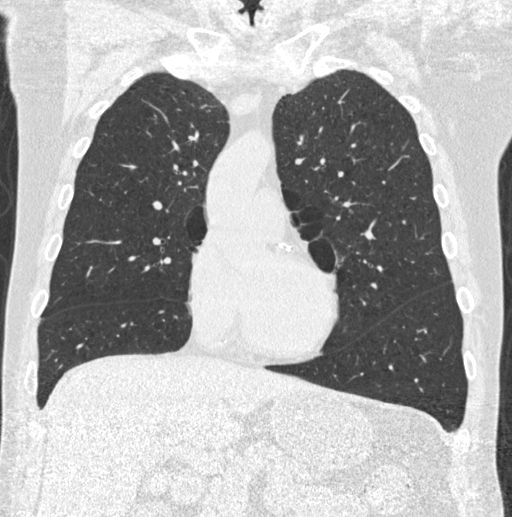
[im 191/318  lung]
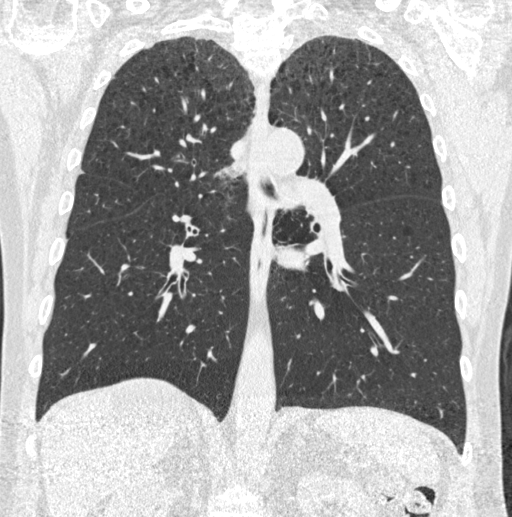

[15 of 40 positions shown; findings below may reference images not displayed]

FINDINGS: Cardiovascular: Bovine arch. Aortic atherosclerosis. Tortuous
thoracic aorta. Normal heart size, without pericardial effusion.
Multivessel coronary artery atherosclerosis.

Mediastinum/Nodes: No mediastinal or definite hilar adenopathy,
given limitations of unenhanced CT.

Lungs/Pleura: No pleural fluid. Moderate centrilobular and mild
paraseptal emphysema. Bilateral pulmonary nodules of maximally
volume derived equivalent diameter 3.8 mm.

Upper Abdomen: High right hepatic lobe dominant 7.9 cm cyst on 63/2,
enlarged from 6.8 cm on the prior exam (when remeasured). Other
smaller well-circumscribed low-density lesions are also cysts and
were present previously.

Normal imaged portions of the spleen, stomach, gallbladder, kidneys.
Mild bilateral adrenal thickening. Abdominal aortic atherosclerosis.

Musculoskeletal: No acute osseous abnormality.
IMPRESSION: 1. Lung-RADS 2, benign appearance or behavior. Continue annual
screening with low-dose chest CT without contrast in 12 months.
2. Aortic Atherosclerosis (YK2IG-VUB.B) and Emphysema (YK2IG-AR1.T).
Coronary artery atherosclerosis.

## 2022-04-18 NOTE — Progress Notes (Deleted)
Name: Stephen [name]   MRN: [id number]    DOB: [birth year]   Date:04/18/2022       Progress Note  Subjective  Chief Complaint  Annual Exam  HPI  Patient presents for annual CPE.  IPSS Questionnaire (AUA-7): Over the past month.   1)  How often have you had a sensation of not emptying your bladder completely after you finish urinating?  {Rating:19227}  2)  How often have you had to urinate again less than two hours after you finished urinating? {Rating:19227}  3)  How often have you found you stopped and started again several times when you urinated?  {Rating:19227}  4) How difficult have you found it to postpone urination?  {Rating:19227}  5) How often have you had a weak urinary stream?  {Rating:19227}  6) How often have you had to push or strain to begin urination?  {Rating:19227}  7) How many times did you most typically get up to urinate from the time you went to bed until the time you got up in the morning?  {Rating:19228}  Total score:  0-7 mildly symptomatic   8-19 moderately symptomatic   20-35 severely symptomatic     Diet: *** Exercise: *** Last Dental Exam: **** Last Eye Exam: ***  Depression: phq 9 is {gen pos neg:315643}    08/25/2021    9:56 AM 07/08/2021    1:15 PM 11/17/2020    2:18 PM 04/27/2020    8:05 AM 04/20/2020    1:56 PM  Depression screen PHQ 2/9  Decreased Interest 0 0 0 0 0  Down, Depressed, Hopeless 0 0 0 0 0  PHQ - 2 Score 0 0 0 0 0  Altered sleeping 0      Tired, decreased energy 0      Change in appetite 0      Feeling bad or failure about yourself  0      Trouble concentrating 0      Moving slowly or fidgety/restless 0      Suicidal thoughts 0      PHQ-9 Score 0        Hypertension:  BP Readings from Last 3 Encounters:  08/25/21 136/82  11/17/20 134/80  04/14/20 130/84    Obesity: Wt Readings from Last 3 Encounters:  08/25/21 202 lb (91.6 kg)  11/17/20 200 lb (90.7 kg)  09/15/20 206 lb (93.4 kg)   BMI Readings from Last 3  Encounters:  08/25/21 27.40 kg/m  11/17/20 27.12 kg/m  09/15/20 27.94 kg/m     Lipids:  Lab Results  Component Value Date   CHOL 177 08/25/2021   CHOL 162 08/12/2019   Lab Results  Component Value Date   HDL 50 08/25/2021   HDL 40 08/12/2019   Lab Results  Component Value Date   LDLCALC 111 (H) 08/25/2021   LDLCALC 93 08/12/2019   Lab Results  Component Value Date   TRIG 71 08/25/2021   TRIG 202 (H) 08/12/2019   Lab Results  Component Value Date   CHOLHDL 3.5 08/25/2021   CHOLHDL 4.1 08/12/2019   No results found for: "LDLDIRECT" Glucose:  Glucose  Date Value Ref Range Status  04/27/2020 111 (H) 65 - 99 mg/dL Final   Glucose, Bld  Date Value Ref Range Status  08/25/2021 97 65 - 99 mg/dL Final    Comment:    .            Fasting reference interval .   08/12/2019 74 65 - 99  mg/dL Final    Comment:    .            Fasting reference interval .   05/18/2018 109 (H) 70 - 99 mg/dL Final    Flowsheet Row Clinical Support [facility] 07/08/2021 in [facility]  AUDIT-C Score 0      Married STD testing and prevention (HIV/chl/gon/syphilis): N/A Sexual history:  Hep C Screening: 08/25/21 Skin cancer: Discussed monitoring for atypical lesions Colorectal cancer: Ordered 04/11/22 Prostate cancer:   Lab Results  Component Value Date   PSA 0.08 08/25/2021     Lung cancer:  Low Dose CT Chest recommended if Age 58-80 years, 30 pack-year currently smoking OR have quit w/in 15years. Patient  no a candidate for screening   AAA: The USPSTF recommends one-time screening with ultrasonography in men ages 86 to 11 years who have ever smoked. Patient   no, a candidate for screening  ECG:  03/15/18  Vaccines:   HPV: N/A Tdap: N/A Shingrix: N/A Pneumonia: up to date Flu: N/A COVID-19: N/A  Advanced Care Planning: A voluntary discussion about advance care planning including the explanation and discussion of advance directives.  Discussed health care  proxy and Living will, and the patient was able to identify a health care proxy as ***.  Patient does not have a living will and power of attorney of health care   Patient Active Problem List   Diagnosis Date Noted   B12 deficiency 08/25/2021   Hyperglycemia 08/25/2021   Lower urinary tract symptoms (LUTS) 08/25/2021   Coronary artery calcification of native artery 08/25/2021   Hematuria, microscopic 08/25/2021   Atypical pigmented lesion 08/25/2021   Senile purpura ([place]) 11/17/2020   Hepatic cyst 10/01/2020   Smoker 08/18/2020   Thoracic aorta atherosclerosis ([place]) 04/27/2020   Current every day smoker 05/18/2016   Other emphysema ([place]) 11/18/2015    No past surgical history on file.  Family History  Problem Relation Age of Onset   Hypertension Mother    Hypothyroidism Mother    Alzheimer's disease Father     Social History   Socioeconomic History   Marital status: Married    Spouse name: Not on file   Number of children: Not on file   Years of education: Not on file   Highest education level: Not on file  Occupational History   Not on file  Tobacco Use   Smoking status: Every Day    Packs/day: 1.00    Years: 45.00    Total pack years: 45.00    Types: Cigarettes   Smokeless tobacco: Never  Vaping Use   Vaping Use: Never used  Substance and Sexual Activity   Alcohol use: No    Alcohol/week: 0.0 standard drinks of alcohol   Drug use: No   Sexual activity: Not Currently  Other Topics Concern   Not on file  Social History Narrative   Not on file   Social Determinants of Health   Financial Resource Strain: Low Risk  (07/08/2021)   Overall Financial Resource Strain (CARDIA)    Difficulty of Paying Living Expenses: Not hard at all  Food Insecurity: No Food Insecurity (07/08/2021)   Hunger Vital Sign    Worried About Running Out of Food in the Last Year: Never true    Ran Out of Food in the Last Year: Never true  Transportation Needs: No Transportation Needs  (07/08/2021)   PRAPARE - Transportation    Lack of Transportation (Medical): No    Lack  of Transportation (Non-Medical): No  Physical Activity: Inactive (07/08/2021)   Exercise Vital Sign    Days of Exercise per Week: 0 days    Minutes of Exercise per Session: 0 min  Stress: No Stress Concern Present (07/08/2021)   [place]    Feeling of Stress : Not at all  Social Connections: Moderately Isolated (07/08/2021)   Social Connection and Isolation Panel [NHANES]    Frequency of Communication with Friends and Family: More than three times a week    Frequency of Social Gatherings with Friends and Family: Twice a week    Attends Religious Services: Never    Marine scientist or Organizations: No    Attends Archivist Meetings: Never    Marital Status: Married  Human resources officer Violence: Not At Risk (07/08/2021)   Humiliation, Afraid, Rape, and Kick questionnaire    Fear of Current or Ex-Partner: No    Emotionally Abused: No    Physically Abused: No    Sexually Abused: No     Current Outpatient Medications:    albuterol (VENTOLIN HFA) 108 (90 Base) MCG/ACT inhaler, INHALE 2 PUFFS INTO THE LUNGS EVERY 6 HOURS AS NEEDED FOR WHEEZING ORSHORTNESS OF BREATH, Disp: 18 g, Rfl: 1   aspirin EC 81 MG tablet, Take 1 tablet (81 mg total) by mouth daily. Swallow whole., Disp: 30 tablet, Rfl: 12   Budeson-Glycopyrrol-Formoterol (BREZTRI AEROSPHERE) 160-9-4.8 MCG/ACT AERO, Inhale 2 puffs into the lungs in the morning and at bedtime., Disp: 32.1 g, Rfl: 3   rosuvastatin (CRESTOR) 10 MG tablet, Take 1 tablet (10 mg total) by mouth daily., Disp: 90 tablet, Rfl: 3   tiZANidine (ZANAFLEX) 2 MG tablet, Take 1-2 tablets (2-4 mg total) by mouth every 6 (six) hours as needed for muscle spasms., Disp: 60 tablet, Rfl: 0  No Known Allergies   ROS  ***   Objective  There were no vitals filed for this visit.  There is no height or  weight on file to calculate BMI.  Physical Exam ***  No results found for this or any previous visit (from the past 2160 hour(s)).   Fall Risk:    08/25/2021    9:54 AM 07/08/2021    1:19 PM 11/17/2020    2:18 PM 04/27/2020    8:05 AM 04/20/2020    1:56 PM  Fall Risk   Falls in the past year? 0 0 0 0 0  Number falls in past yr: 0 0 0 0 0  Injury with Fall? 0 0 0 0 0  Risk for fall due to : No Fall Risks No Fall Risks     Follow up Falls prevention discussed Falls prevention discussed        Functional Status Survey:      Assessment & Plan  1. Well adult exam ***    -Prostate cancer screening and PSA options (with potential risks and benefits of testing vs not testing) were discussed along with recent recs/guidelines. -USPSTF grade A and B recommendations reviewed with patient; age-appropriate recommendations, preventive care, screening tests, etc discussed and encouraged; healthy living encouraged; see AVS for patient education given to patient -Discussed importance of 150 minutes of physical activity weekly, eat two servings of fish weekly, eat one serving of tree nuts ( cashews, pistachios, pecans, almonds.Marland Kitchen) every other day, eat 6 servings of fruit/vegetables daily and drink plenty of water and avoid sweet beverages.  -Reviewed Health Maintenance: yes

## 2022-04-18 NOTE — Patient Instructions (Incomplete)
Preventive Care 65 Years and Older, Male Preventive care refers to lifestyle choices and visits with your health care provider that can promote health and wellness. Preventive care visits are also called wellness exams. What can I expect for my preventive care visit? Counseling During your preventive care visit, your health care provider may ask about your: Medical history, including: Past medical problems. Family medical history. History of falls. Current health, including: Emotional well-being. Home life and relationship well-being. Sexual activity. Memory and ability to understand (cognition). Lifestyle, including: Alcohol, nicotine or tobacco, and drug use. Access to firearms. Diet, exercise, and sleep habits. Work and work environment. Sunscreen use. Safety issues such as seatbelt and bike helmet use. Physical exam Your health care provider will check your: Height and weight. These may be used to calculate your BMI (body mass index). BMI is a measurement that tells if you are at a healthy weight. Waist circumference. This measures the distance around your waistline. This measurement also tells if you are at a healthy weight and may help predict your risk of certain diseases, such as type 2 diabetes and high blood pressure. Heart rate and blood pressure. Body temperature. Skin for abnormal spots. What immunizations do I need?  Vaccines are usually given at various ages, according to a schedule. Your health care provider will recommend vaccines for you based on your age, medical history, and lifestyle or other factors, such as travel or where you work. What tests do I need? Screening Your health care provider may recommend screening tests for certain conditions. This may include: Lipid and cholesterol levels. Diabetes screening. This is done by checking your blood sugar (glucose) after you have not eaten for a while (fasting). Hepatitis C test. Hepatitis B test. HIV (human  immunodeficiency virus) test. STI (sexually transmitted infection) testing, if you are at risk. Lung cancer screening. Colorectal cancer screening. Prostate cancer screening. Abdominal aortic aneurysm (AAA) screening. You may need this if you are a current or former smoker. Talk with your health care provider about your test results, treatment options, and if necessary, the need for more tests. Follow these instructions at home: Eating and drinking  Eat a diet that includes fresh fruits and vegetables, whole grains, lean protein, and low-fat dairy products. Limit your intake of foods with high amounts of sugar, saturated fats, and salt. Take vitamin and mineral supplements as recommended by your health care provider. Do not drink alcohol if your health care provider tells you not to drink. If you drink alcohol: Limit how much you have to 0-2 drinks a day. Know how much alcohol is in your drink. In the [place] one drink equals one 12 oz bottle of beer (355 mL), one 5 oz glass of wine (148 mL), or one 1 oz glass of hard liquor (44 mL). Lifestyle Brush your teeth every morning and night with fluoride toothpaste. Floss one time each day. Exercise for at least 30 minutes 5 or more days each week. Do not use any products that contain nicotine or tobacco. These products include cigarettes, chewing tobacco, and vaping devices, such as e-cigarettes. If you need help quitting, ask your health care provider. Do not use drugs. If you are sexually active, practice safe sex. Use a condom or other form of protection to prevent STIs. Take aspirin only as told by your health care provider. Make sure that you understand how much to take and what form to take. Work with your health care provider to find out whether it is safe   and beneficial for you to take aspirin daily. Ask your health care provider if you need to take a cholesterol-lowering medicine (statin). Find healthy ways to manage stress, such  as: Meditation, yoga, or listening to music. Journaling. Talking to a trusted person. Spending time with friends and family. Safety Always wear your seat belt while driving or riding in a vehicle. Do not drive: If you have been drinking alcohol. Do not ride with someone who has been drinking. When you are tired or distracted. While texting. If you have been using any mind-altering substances or drugs. Wear a helmet and other protective equipment during sports activities. If you have firearms in your house, make sure you follow all gun safety procedures. Minimize exposure to UV radiation to reduce your risk of skin cancer. What's next? Visit your health care provider once a year for an annual wellness visit. Ask your health care provider how often you should have your eyes and teeth checked. Stay up to date on all vaccines. This information is not intended to replace advice given to you by your health care provider. Make sure you discuss any questions you have with your health care provider. Document Revised: 09/16/2020 Document Reviewed: 09/16/2020 Elsevier Patient Education  2023 [employer]  

## 2022-04-19 ENCOUNTER — Ambulatory Visit: Payer: Medicare PPO | Admitting: Family Medicine

## 2022-04-19 DIAGNOSIS — Z0000 Encounter for general adult medical examination without abnormal findings: Secondary | ICD-10-CM

## 2022-05-02 NOTE — Progress Notes (Unsigned)
Name: Stephen [name]   MRN: [id number]    DOB: 12/02/1955   Date:05/03/2022       Progress Note  Subjective  Chief Complaint  Medication Refill  HPI  Emphysema:he started smoking at age 67 and smokes on average one pack daily, not ready to quit.  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 He has to stop or use rescue inhaler to improve symptoms. He is getting Brezti from [employer] He states he will try to quit smoking on Feb 1st  2024   Chest pain: it has happened three times over the past 6-8 months. He states initially very brief. The third episode happened after walking after he smoked a cigarette in his car. He also felt hot/clammy, it lasted about 5 minutes but resolved by itself. Discussed risk of heart attack, discussed referral to cardiologist but he wants to hold off for now. He will call 911 if it happens again and lasts over 5 minutes . Also discussed other symptoms that can occur with angina   Atherosclerosis of aorta: on his CT chest seen 05/19/2019 he also has coronary calcifications - discussed starting aspirin 81 mg and statin therapy again.   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   Patient Active Problem List   Diagnosis Date Noted   B12 deficiency 08/25/2021   Hyperglycemia 08/25/2021   Lower urinary tract symptoms (LUTS) 08/25/2021   Coronary artery calcification of native artery 08/25/2021   Hematuria, microscopic 08/25/2021   Atypical pigmented lesion 08/25/2021   Senile purpura (HCC) 11/17/2020   Hepatic cyst 10/01/2020   Smoker 08/18/2020    Thoracic aorta atherosclerosis (HCC) 04/27/2020   Current every day smoker 05/18/2016   Other emphysema (HCC) 11/18/2015    No past surgical history on file.  Family History  Problem Relation Age of Onset   Hypertension Mother    Hypothyroidism Mother    Alzheimer's disease Father     Social History   Tobacco Use   Smoking status: Every Day    Packs/day: 1.00    Years: 45.00    Total pack years: 45.00    Types: Cigarettes   Smokeless tobacco: Never  Substance Use Topics   Alcohol use: No    Alcohol/week: 0.0 standard drinks of alcohol     Current Outpatient Medications:    albuterol (VENTOLIN HFA) 108 (90 Base) MCG/ACT inhaler, INHALE 2 PUFFS INTO THE LUNGS EVERY 6 HOURS AS NEEDED FOR WHEEZING ORSHORTNESS OF BREATH, Disp: 18 g, Rfl: 1   Budeson-Glycopyrrol-Formoterol (BREZTRI AEROSPHERE) 160-9-4.8 MCG/ACT AERO, Inhale 2 puffs into the lungs in the morning and at bedtime., Disp: 32.1 g, Rfl: 3   aspirin EC 81 MG tablet, Take 1 tablet (81 mg total) by mouth daily. Swallow whole. (Patient not taking: Reported on 05/03/2022), Disp: 30 tablet, Rfl: 12   rosuvastatin (CRESTOR) 10 MG tablet, Take 1 tablet (10 mg total) by mouth daily. (Patient not taking: Reported on 05/03/2022), Disp: 90 tablet, Rfl: 3   tiZANidine (ZANAFLEX) 2 MG tablet, Take 1-2 tablets (2-4  mg total) by mouth every 6 (six) hours as needed for muscle spasms. (Patient not taking: Reported on 05/03/2022), Disp: 60 tablet, Rfl: 0  No Known Allergies  I personally reviewed active problem list, medication list, allergies, family history, social history with the patient/caregiver today.   ROS  Ten systems reviewed and is negative except as mentioned in HPI   Objective  Vitals:   05/03/22 1510  BP: 132/80  Pulse: 81  Resp: 16  SpO2: 97%  Weight: 206 lb (93.4 kg)  Height: 6' (1.829 m)    Body mass index is 27.94 kg/m.  Physical Exam  Constitutional: Patient appears well-developed and well-nourished.   No distress.  HEENT: head atraumatic, normocephalic, pupils equal and reactive to light, ears normal TM, neck supple Cardiovascular: Normal rate, regular rhythm and normal heart sounds.  No murmur heard. No BLE edema. Pulmonary/Chest: Effort normal and breath sounds normal. No respiratory distress. Abdominal: Soft.  There is no tenderness. Psychiatric: Patient has a normal mood and affect. behavior is normal. Judgment and thought content normal.    PHQ2/9:    05/03/2022    3:10 PM 08/25/2021    9:56 AM 07/08/2021    1:15 PM 11/17/2020    2:18 PM 04/27/2020    8:05 AM  Depression screen PHQ 2/9  Decreased Interest 0 0 0 0 0  Down, Depressed, Hopeless 0 0 0 0 0  PHQ - 2 Score 0 0 0 0 0  Altered sleeping 0 0     Tired, decreased energy 0 0     Change in appetite 0 0     Feeling bad or failure about yourself  0 0     Trouble concentrating 0 0     Moving slowly or fidgety/restless 0 0     Suicidal thoughts 0 0     PHQ-9 Score 0 0       phq 9 is negative   Fall Risk:    05/03/2022    3:09 PM 08/25/2021    9:54 AM 07/08/2021    1:19 PM 11/17/2020    2:18 PM 04/27/2020    8:05 AM  Fall Risk   Falls in the past year? 0 0 0 0 0  Number falls in past yr: 0 0 0 0 0  Injury with Fall? 0 0 0 0 0  Risk for fall due to : No Fall Risks No Fall Risks No Fall Risks    Follow up Falls prevention discussed Falls prevention discussed Falls prevention discussed        Functional Status Survey: Is the patient deaf or have difficulty hearing?: No Does the patient have difficulty seeing, even when wearing glasses/contacts?: No Does the patient have difficulty concentrating, remembering, or making decisions?: No Does the patient have difficulty walking or climbing stairs?: No Does the patient have difficulty dressing or bathing?: No Does the patient have difficulty doing errands alone such as visiting a doctor's office or shopping?: No    Assessment & Plan  1. Other emphysema ([place])  -  Budeson-Glycopyrrol-Formoterol (BREZTRI AEROSPHERE) 160-9-4.8 MCG/ACT AERO; Inhale 2 puffs into the lungs in the morning and at bedtime.  Dispense: 32.1 g; Refill: 3  2. Thoracic aorta atherosclerosis (HCC)  - rosuvastatin (CRESTOR) 10 MG tablet; Take 1 tablet (10 mg total) by mouth daily.  Dispense: 90 tablet; Refill: 3  3. Senile purpura ([place])  Reassurance given   4. Coronary artery calcification of native artery  - rosuvastatin (CRESTOR) 10 MG tablet; Take  1 tablet (10 mg total) by mouth daily.  Dispense: 90 tablet; Refill: 3  5. B12 deficiency  - Cyanocobalamin (B-12) 1000 MCG SUBL; Place 1 tablet under the tongue daily.  Dispense: 100 tablet; Refill: 1  6. Intermittent left-sided chest pain  Explained it may be angina, advised to go to cardiologist but not interested at this time   7. Tobacco dependence   8. Colon cancer screening  - Fecal Globin By Immunochemistry

## 2022-05-03 ENCOUNTER — Encounter: Payer: Self-pay | Admitting: Family Medicine

## 2022-05-03 ENCOUNTER — Ambulatory Visit (INDEPENDENT_AMBULATORY_CARE_PROVIDER_SITE_OTHER): Payer: Medicare PPO | Admitting: Family Medicine

## 2022-05-03 VITALS — BP 132/80 | HR 81 | Resp 16 | Ht 72.0 in | Wt 206.0 lb

## 2022-05-03 DIAGNOSIS — I2584 Coronary atherosclerosis due to calcified coronary lesion: Secondary | ICD-10-CM

## 2022-05-03 DIAGNOSIS — R0789 Other chest pain: Secondary | ICD-10-CM

## 2022-05-03 DIAGNOSIS — J438 Other emphysema: Secondary | ICD-10-CM | POA: Diagnosis not present

## 2022-05-03 DIAGNOSIS — F17200 Nicotine dependence, unspecified, uncomplicated: Secondary | ICD-10-CM

## 2022-05-03 DIAGNOSIS — I700 Atherosclerosis of aorta: Secondary | ICD-10-CM | POA: Diagnosis not present

## 2022-05-03 DIAGNOSIS — I2510 Atherosclerotic heart disease of native coronary artery without angina pectoris: Secondary | ICD-10-CM

## 2022-05-03 DIAGNOSIS — Z0000 Encounter for general adult medical examination without abnormal findings: Secondary | ICD-10-CM

## 2022-05-03 DIAGNOSIS — Z1211 Encounter for screening for malignant neoplasm of colon: Secondary | ICD-10-CM

## 2022-05-03 DIAGNOSIS — D692 Other nonthrombocytopenic purpura: Secondary | ICD-10-CM | POA: Diagnosis not present

## 2022-05-03 DIAGNOSIS — E538 Deficiency of other specified B group vitamins: Secondary | ICD-10-CM

## 2022-05-03 MED ORDER — B-12 1000 MCG SL SUBL: 1.0000 | SUBLINGUAL_TABLET | Freq: Every day | SUBLINGUAL | 1 refills | Status: DC

## 2022-05-03 MED ORDER — BREZTRI AEROSPHERE 160-9-4.8 MCG/ACT IN AERO: 2.0000 | INHALATION_SPRAY | Freq: Two times a day (BID) | RESPIRATORY_TRACT | 3 refills | Status: DC

## 2022-05-03 MED ORDER — ROSUVASTATIN CALCIUM 10 MG PO TABS: 10.0000 mg | ORAL_TABLET | Freq: Every day | ORAL | 3 refills | Status: DC

## 2022-05-09 ENCOUNTER — Telehealth: Payer: Self-pay | Admitting: Family Medicine

## 2022-05-09 ENCOUNTER — Other Ambulatory Visit: Payer: Self-pay | Admitting: Family Medicine

## 2022-05-09 DIAGNOSIS — J438 Other emphysema: Secondary | ICD-10-CM

## 2022-05-09 NOTE — Telephone Encounter (Signed)
Pt is calling wanting to speak to Dr Ancil Boozer, or if her specfic nurse could fu asap as he states there is an issue with med below and [employer] is supposed to be calling Dr Ancil Boozer and he needs to clarify before she gets the call from them or there will be confusion. [phone]  Budeson-Glycopyrrol-Formoterol (BREZTRI AEROSPHERE) 160-9-4.8 MCG/ACT AERO

## 2022-05-10 NOTE — Telephone Encounter (Signed)
I did not assist with this patient.

## 2022-05-19 ENCOUNTER — Other Ambulatory Visit: Payer: Self-pay | Admitting: Family Medicine

## 2022-05-19 MED ORDER — TRELEGY ELLIPTA 100-62.5-25 MCG/ACT IN AEPB: 1.0000 | INHALATION_SPRAY | Freq: Every day | RESPIRATORY_TRACT | 3 refills | Status: DC

## 2022-06-14 ENCOUNTER — Encounter: Payer: Medicare PPO | Admitting: Family Medicine

## 2022-06-23 ENCOUNTER — Other Ambulatory Visit: Payer: Self-pay

## 2022-06-23 DIAGNOSIS — R195 Other fecal abnormalities: Secondary | ICD-10-CM

## 2022-06-23 LAB — FECAL GLOBIN BY IMMUNOCHEMISTRY
FECAL GLOBIN RESULT:: DETECTED — AB
MICRO NUMBER:: 14717573
SPECIMEN QUALITY:: ADEQUATE

## 2022-07-06 ENCOUNTER — Encounter: Payer: Self-pay | Admitting: *Deleted

## 2022-07-14 ENCOUNTER — Telehealth: Payer: Self-pay

## 2022-07-14 NOTE — Telephone Encounter (Signed)
07/14/2022 01:02 PM EDT by Sue Lush, LPN  Outgoing Agrusa, Yassir (Self) [phone] (Mobile) Remove  Left Message - left msg to rtn call to practice for AWV or r/s if not available  07/14/2022 01:13 PM EDT by Sue Lush, LPN  Outgoing Mosco, Del (Self) [phone] (Mobile) Remove  Left Message - lft msg to call to r/s

## 2022-07-26 ENCOUNTER — Ambulatory Visit (INDEPENDENT_AMBULATORY_CARE_PROVIDER_SITE_OTHER): Payer: Medicare PPO | Admitting: Family Medicine

## 2022-07-26 ENCOUNTER — Encounter: Payer: Self-pay | Admitting: Family Medicine

## 2022-07-26 VITALS — BP 138/76 | HR 85 | Resp 16 | Ht 72.0 in | Wt 211.0 lb

## 2022-07-26 DIAGNOSIS — J42 Unspecified chronic bronchitis: Secondary | ICD-10-CM

## 2022-07-26 DIAGNOSIS — R195 Other fecal abnormalities: Secondary | ICD-10-CM

## 2022-07-26 DIAGNOSIS — R1319 Other dysphagia: Secondary | ICD-10-CM

## 2022-07-26 DIAGNOSIS — J209 Acute bronchitis, unspecified: Secondary | ICD-10-CM | POA: Diagnosis not present

## 2022-07-26 MED ORDER — METHYLPREDNISOLONE 4 MG PO TBPK: ORAL_TABLET | ORAL | 0 refills | Status: DC

## 2022-07-26 MED ORDER — BENZONATATE 100 MG PO CAPS: 100.0000 mg | ORAL_CAPSULE | Freq: Two times a day (BID) | ORAL | 0 refills | Status: DC | PRN

## 2022-07-26 NOTE — Progress Notes (Addendum)
Name: Stephen [name]   MRN: [id number]    DOB: 1955-08-25   Date:07/26/2022       Progress Note  Subjective  Chief Complaint  Bronchitis  HPI  He states symptoms started 3 days ago , initially noticed some rhinorrhea, followed by chest congestion, cough that is dry, some SOB with activity, no fever or chills. He did not sleep well last night and is very tired now. He has some upper abdominal soreness He said he has noticed an itchy rash on right calf that improves with hydrocortisone. He denies orthopnea or lower extremity edema   Positive cologuard he also has dysphagia with solids or liquids, he states usually happens when he has bronchitis, but intermittently in between. Discussed referral to GI   Patient Active Problem List   Diagnosis Date Noted   Intermittent left-sided chest pain 05/03/2022   B12 deficiency 08/25/2021   Hyperglycemia 08/25/2021   Lower urinary tract symptoms (LUTS) 08/25/2021   Coronary artery calcification of native artery 08/25/2021   Hematuria, microscopic 08/25/2021   Atypical pigmented lesion 08/25/2021   Senile purpura 11/17/2020   Hepatic cyst 10/01/2020   Smoker 08/18/2020   Thoracic aorta atherosclerosis 04/27/2020   Current every day smoker 05/18/2016   Other emphysema 11/18/2015    No past surgical history on file.  Family History  Problem Relation Age of Onset   Hypertension Mother    Hypothyroidism Mother    Alzheimer's disease Father     Social History   Tobacco Use   Smoking status: Every Day    Packs/day: 1.00    Years: 45.00    Additional pack years: 0.00    Total pack years: 45.00    Types: Cigarettes   Smokeless tobacco: Never  Substance Use Topics   Alcohol use: No    Alcohol/week: 0.0 standard drinks of alcohol     Current Outpatient Medications:    albuterol (VENTOLIN HFA) 108 (90 Base) MCG/ACT inhaler, INHALE 2 PUFFS INTO THE LUNGS EVERY 6 HOURS AS NEEDED FOR WHEEZING ORSHORTNESS OF BREATH, Disp: 18 g, Rfl: 1    aspirin EC 81 MG tablet, Take 1 tablet (81 mg total) by mouth daily. Swallow whole., Disp: 30 tablet, Rfl: 12   Cyanocobalamin (B-12) 1000 MCG SUBL, Place 1 tablet under the tongue daily., Disp: 100 tablet, Rfl: 1   Fluticasone-Umeclidin-Vilant (TRELEGY ELLIPTA) 100-62.5-25 MCG/ACT AEPB, Inhale 1 puff into the lungs daily. In place of Brezti, Disp: 3 each, Rfl: 3   rosuvastatin (CRESTOR) 10 MG tablet, Take 1 tablet (10 mg total) by mouth daily., Disp: 90 tablet, Rfl: 3  No Known Allergies  I personally reviewed active problem list, medication list, allergies, family history, social history, health maintenance with the patient/caregiver today.   ROS  Ten systems reviewed and is negative except as mentioned in HPI   Objective  Vitals:   07/26/22 1427  BP: 138/76  Pulse: 85  Resp: 16  SpO2: 96%  Weight: 211 lb (95.7 kg)  Height: 6' (1.829 m)    Body mass index is 28.62 kg/m.  Physical Exam  Constitutional: Patient appears well-developed and well-nourished.  No distress.  HEENT: head atraumatic, normocephalic, pupils equal and reactive to light, neck supple Cardiovascular: Normal rate, regular rhythm and normal heart sounds.  No murmur heard. No BLE edema. Pulmonary/Chest: Effort normal he has scattered rhonchi, no crackles or wheezing  No respiratory distress. Abdominal: Soft.  There is no tenderness. Psychiatric: Patient has a normal mood and affect. behavior is normal. Judgment  and thought content normal.   Recent Results (from the past 2160 hour(s))  Fecal Globin By Immunochemistry     Status: Abnormal   Collection Time: 06/23/22 12:00 AM  Result Value Ref Range   MICRO NUMBER: [id number]    SPECIMEN QUALITY: Adequate    Source: INSURE (TM) FOBT TEST CARD    STATUS: FINAL    FECAL GLOBIN RESULT: Detected (A)     Comment: Detected    PHQ2/9:    07/26/2022    2:27 PM 05/03/2022    3:10 PM 08/25/2021    9:56 AM 07/08/2021    1:15 PM 11/17/2020    2:18 PM  Depression  screen PHQ 2/9  Decreased Interest 0 0 0 0 0  Down, Depressed, Hopeless 0 0 0 0 0  PHQ - 2 Score 0 0 0 0 0  Altered sleeping 0 0 0    Tired, decreased energy 0 0 0    Change in appetite 0 0 0    Feeling bad or failure about yourself  0 0 0    Trouble concentrating 0 0 0    Moving slowly or fidgety/restless 0 0 0    Suicidal thoughts 0 0 0    PHQ-9 Score 0 0 0      phq 9 is negative   Fall Risk:    07/26/2022    2:27 PM 05/03/2022    3:09 PM 08/25/2021    9:54 AM 07/08/2021    1:19 PM 11/17/2020    2:18 PM  Fall Risk   Falls in the past year? 0 0 0 0 0  Number falls in past yr: 0 0 0 0 0  Injury with Fall? 0 0 0 0 0  Risk for fall due to : No Fall Risks No Fall Risks No Fall Risks No Fall Risks   Follow up Falls prevention discussed Falls prevention discussed Falls prevention discussed Falls prevention discussed     Functional Status Survey: Is the patient deaf or have difficulty hearing?: No Does the patient have difficulty seeing, even when wearing glasses/contacts?: No Does the patient have difficulty concentrating, remembering, or making decisions?: No Does the patient have difficulty walking or climbing stairs?: No Does the patient have difficulty dressing or bathing?: No Does the patient have difficulty doing errands alone such as visiting a doctor's office or shopping?: No    Assessment & Plan  1. Chronic bronchitis with acute exacerbation  We will try benzonate and also medrol dose pack, advised patient to call and scheduled his CT lung, ordered placed in October  Explained importance of taking steroids with food and if makes dysphagia worse to stop taking and contact me back   2. Positive fecal occult blood test  - Ambulatory referral to Gastroenterology  3. Esophageal dysphagia  - Ambulatory referral to Gastroenterology

## 2022-08-02 ENCOUNTER — Other Ambulatory Visit: Payer: Self-pay

## 2022-08-02 ENCOUNTER — Ambulatory Visit: Payer: Self-pay | Admitting: *Deleted

## 2022-08-02 DIAGNOSIS — R058 Other specified cough: Secondary | ICD-10-CM

## 2022-08-02 DIAGNOSIS — J209 Acute bronchitis, unspecified: Secondary | ICD-10-CM

## 2022-08-02 NOTE — Telephone Encounter (Signed)
bronchitis   Pt called asking for another round of Prednisone.  He just finished his yesterday and is still having symptoms.  Diagnosed with bronchitis a couple weeks.  CB#  [phone]     Chief Complaint: Cough Symptoms: Completed prednisone 2 days ago. States still has cough, no worsening. Frequency:  Pertinent Negatives: Patient denies worsening, fever Disposition: [] ED /[] Urgent Care (no appt availability in office) / [] Appointment(In office/virtual)/ []  [facility]/ [] Home Care/ [] Refused Recommended Disposition /[] [facility]/ [x]  Follow-up with PCP Additional Notes: Pt states he often needs another round of steroids. States PCP has called in additional in past. States somewhat improved, but still coughing. Also reports itching. "Not from meds, not a reaction, I get itchy all over any time I get bronchitis." Asking if benadryl will help, if he can take with his other meds. Assured pt NT would route to practice for PCPs review and final disposition.  Please advise.   Reason for Disposition  [1] Caller has URGENT medicine question about med that PCP or specialist prescribed AND [2] triager unable to answer question  Answer Assessment - Initial Assessment Questions 1. NAME of MEDICINE: "What medicine(s) are you calling about?"     Prednisone 2. QUESTION: "What is your question?" (e.g., double dose of medicine, side effect)     Need another round 3. PRESCRIBER: "Who prescribed the medicine?" Reason: if prescribed by specialist, call should be referred to that group.     PCP 4. SYMPTOMS: "Do you have any symptoms?" If Yes, ask: "What symptoms are you having?"  "How bad are the symptoms (e.g., mild, moderate, severe)     Cough, mild productive, clear  Protocols used: Medication Question Call-A-AH

## 2022-08-02 NOTE — Telephone Encounter (Signed)
Spoke with patient, (Dr. Carlynn Purl present) Patient would like to have a CXR completed and then proceed with CT scheduling/Pulmonology referral. Order placed for CXR and patient verbalized understanding of location/scheduling information and agreed to have in completed within the week prior to any medications being recommended.

## 2022-08-05 ENCOUNTER — Telehealth: Payer: Self-pay

## 2022-08-05 NOTE — Telephone Encounter (Signed)
Copied from CRM [id number]. Topic: General - Inquiry >> Aug 05, 2022 10:06 AM De Blanch wrote: Reason for CRM: Pt asked that I send Dr.Sowles a message stating he would like to ask Dr. Carlynn Purl if she would take his wife as a patient. Patient was advised that Dr.Sowles is not accepting new patients; however, he insisted that I ask.  He asked that I let her know as well that he would be going to get his chest x-ray done today. Please advise.

## 2022-08-08 NOTE — Patient Instructions (Signed)
Preventive Care 65 Years and Older, Male Preventive care refers to lifestyle choices and visits with your health care provider that can promote health and wellness. Preventive care visits are also called wellness exams. What can I expect for my preventive care visit? Counseling During your preventive care visit, your health care provider may ask about your: Medical history, including: Past medical problems. Family medical history. History of falls. Current health, including: Emotional well-being. Home life and relationship well-being. Sexual activity. Memory and ability to understand (cognition). Lifestyle, including: Alcohol, nicotine or tobacco, and drug use. Access to firearms. Diet, exercise, and sleep habits. Work and work environment. Sunscreen use. Safety issues such as seatbelt and bike helmet use. Physical exam Your health care provider will check your: Height and weight. These may be used to calculate your BMI (body mass index). BMI is a measurement that tells if you are at a healthy weight. Waist circumference. This measures the distance around your waistline. This measurement also tells if you are at a healthy weight and may help predict your risk of certain diseases, such as type 2 diabetes and high blood pressure. Heart rate and blood pressure. Body temperature. Skin for abnormal spots. What immunizations do I need?  Vaccines are usually given at various ages, according to a schedule. Your health care provider will recommend vaccines for you based on your age, medical history, and lifestyle or other factors, such as travel or where you work. What tests do I need? Screening Your health care provider may recommend screening tests for certain conditions. This may include: Lipid and cholesterol levels. Diabetes screening. This is done by checking your blood sugar (glucose) after you have not eaten for a while (fasting). Hepatitis C test. Hepatitis B test. HIV (human  immunodeficiency virus) test. STI (sexually transmitted infection) testing, if you are at risk. Lung cancer screening. Colorectal cancer screening. Prostate cancer screening. Abdominal aortic aneurysm (AAA) screening. You may need this if you are a current or former smoker. Talk with your health care provider about your test results, treatment options, and if necessary, the need for more tests. Follow these instructions at home: Eating and drinking  Eat a diet that includes fresh fruits and vegetables, whole grains, lean protein, and low-fat dairy products. Limit your intake of foods with high amounts of sugar, saturated fats, and salt. Take vitamin and mineral supplements as recommended by your health care provider. Do not drink alcohol if your health care provider tells you not to drink. If you drink alcohol: Limit how much you have to 0-2 drinks a day. Know how much alcohol is in your drink. In the [place] one drink equals one 12 oz bottle of beer (355 mL), one 5 oz glass of wine (148 mL), or one 1 oz glass of hard liquor (44 mL). Lifestyle Brush your teeth every morning and night with fluoride toothpaste. Floss one time each day. Exercise for at least 30 minutes 5 or more days each week. Do not use any products that contain nicotine or tobacco. These products include cigarettes, chewing tobacco, and vaping devices, such as e-cigarettes. If you need help quitting, ask your health care provider. Do not use drugs. If you are sexually active, practice safe sex. Use a condom or other form of protection to prevent STIs. Take aspirin only as told by your health care provider. Make sure that you understand how much to take and what form to take. Work with your health care provider to find out whether it is safe   and beneficial for you to take aspirin daily. Ask your health care provider if you need to take a cholesterol-lowering medicine (statin). Find healthy ways to manage stress, such  as: Meditation, yoga, or listening to music. Journaling. Talking to a trusted person. Spending time with friends and family. Safety Always wear your seat belt while driving or riding in a vehicle. Do not drive: If you have been drinking alcohol. Do not ride with someone who has been drinking. When you are tired or distracted. While texting. If you have been using any mind-altering substances or drugs. Wear a helmet and other protective equipment during sports activities. If you have firearms in your house, make sure you follow all gun safety procedures. Minimize exposure to UV radiation to reduce your risk of skin cancer. What's next? Visit your health care provider once a year for an annual wellness visit. Ask your health care provider how often you should have your eyes and teeth checked. Stay up to date on all vaccines. This information is not intended to replace advice given to you by your health care provider. Make sure you discuss any questions you have with your health care provider. Document Revised: 09/16/2020 Document Reviewed: 09/16/2020 Elsevier Patient Education  2023 [employer]  

## 2022-08-08 NOTE — Telephone Encounter (Signed)
Tried calling to let pt know that sowles is not excepting new patients

## 2022-08-08 NOTE — Progress Notes (Unsigned)
Name: Stephen [name]   MRN: [id number]    DOB: 26-Feb-1956   Date:08/09/2022       Progress Note  Subjective  Chief Complaint  Annual Exam  HPI  Patient presents for annual CPE.  IPSS Questionnaire (AUA-7): Over the past month.   1)  How often have you had a sensation of not emptying your bladder completely after you finish urinating?  5 - Almost always  2)  How often have you had to urinate again less than two hours after you finished urinating? 5 - Almost always  3)  How often have you found you stopped and started again several times when you urinated?  5 - Almost always  4) How difficult have you found it to postpone urination?  5 - Almost always  5) How often have you had a weak urinary stream?  5 - Almost always  6) How often have you had to push or strain to begin urination?  3 - About half the time  7) How many times did you most typically get up to urinate from the time you went to bed until the time you got up in the morning?  5 - 5+ times  Total score:  0-7 mildly symptomatic   8-19 moderately symptomatic   20-35 severely symptomatic      Diet: eating out most of the time, fast food and restaurant food, not eating enough fruit . Discussed at least have breakfast at home  Exercise: discussed regular activity  Last Dental Exam: he is not up to date  Last Eye Exam: he needs to schedule a visit, noticing decrease in vision on right eye   Depression: phq 9 is negative    08/09/2022    3:16 PM 07/26/2022    2:27 PM 05/03/2022    3:10 PM 08/25/2021    9:56 AM 07/08/2021    1:15 PM  Depression screen PHQ 2/9  Decreased Interest 0 0 0 0 0  Down, Depressed, Hopeless 0 0 0 0 0  PHQ - 2 Score 0 0 0 0 0  Altered sleeping 1 0 0 0   Tired, decreased energy 1 0 0 0   Change in appetite 0 0 0 0   Feeling bad or failure about yourself  0 0 0 0   Trouble concentrating 0 0 0 0   Moving slowly or fidgety/restless 0 0 0 0   Suicidal thoughts 0 0 0 0   PHQ-9 Score 2 0 0 0   Difficult  doing work/chores Not difficult at all        Hypertension:  BP Readings from Last 3 Encounters:  08/09/22 124/72  07/26/22 138/76  05/03/22 132/80    Obesity: Wt Readings from Last 3 Encounters:  08/09/22 209 lb 1.6 oz (94.8 kg)  07/26/22 211 lb (95.7 kg)  05/03/22 206 lb (93.4 kg)   BMI Readings from Last 3 Encounters:  08/09/22 28.36 kg/m  07/26/22 28.62 kg/m  05/03/22 27.94 kg/m     Lipids:  Lab Results  Component Value Date   CHOL 177 08/25/2021   CHOL 162 08/12/2019   Lab Results  Component Value Date   HDL 50 08/25/2021   HDL 40 08/12/2019   Lab Results  Component Value Date   LDLCALC 111 (H) 08/25/2021   LDLCALC 93 08/12/2019   Lab Results  Component Value Date   TRIG 71 08/25/2021   TRIG 202 (H) 08/12/2019   Lab Results  Component Value Date  CHOLHDL 3.5 08/25/2021   CHOLHDL 4.1 08/12/2019   No results found for: "LDLDIRECT" Glucose:  Glucose  Date Value Ref Range Status  04/27/2020 111 (H) 65 - 99 mg/dL Final   Glucose, Bld  Date Value Ref Range Status  08/25/2021 97 65 - 99 mg/dL Final    Comment:    .            Fasting reference interval .   08/12/2019 74 65 - 99 mg/dL Final    Comment:    .            Fasting reference interval .   05/18/2018 109 (H) 70 - 99 mg/dL Final    Flowsheet Row Clinical Support from 07/08/2021 in [facility]  AUDIT-C Score 0       Married STD testing and prevention (HIV/chl/gon/syphilis): N/A Sexual history: no problems  Hep C Screening: 08/25/21 Skin cancer: Discussed monitoring for atypical lesions Colorectal cancer: positive FIT test and was going to see GI today but had to postpone - going back in two weeks  Prostate cancer:   Lab Results  Component Value Date   PSA 0.08 08/25/2021     Lung cancer:  he is due for repeat  AAA: done CT 2022  ECG:  03/15/18  Vaccines:   RSV: discussed with patient  Tdap: discussed with patient  Shingrix: discussed with  patient  Pneumonia: up to date Flu: N/A COVID-19: N/A  Advanced Care Planning: A voluntary discussion about advance care planning including the explanation and discussion of advance directives.  Discussed health care proxy and Living will, and the patient was able to identify a health care proxy as wife .  Patient does not have a living will and power of attorney of health care   Patient Active Problem List   Diagnosis Date Noted   Intermittent left-sided chest pain 05/03/2022   B12 deficiency 08/25/2021   Hyperglycemia 08/25/2021   Lower urinary tract symptoms (LUTS) 08/25/2021   Coronary artery calcification of native artery 08/25/2021   Hematuria, microscopic 08/25/2021   Atypical pigmented lesion 08/25/2021   Senile purpura (HCC) 11/17/2020   Hepatic cyst 10/01/2020   Smoker 08/18/2020   Thoracic aorta atherosclerosis (HCC) 04/27/2020   Current every day smoker 05/18/2016   Other emphysema (HCC) 11/18/2015    History reviewed. No pertinent surgical history.  Family History  Problem Relation Age of Onset   Hypertension Mother    Hypothyroidism Mother    Alzheimer's disease Father     Social History   Socioeconomic History   Marital status: Married    Spouse name: Not on file   Number of children: Not on file   Years of education: Not on file   Highest education level: Not on file  Occupational History   Not on file  Tobacco Use   Smoking status: Every Day    Packs/day: 1.00    Years: 45.00    Additional pack years: 0.00    Total pack years: 45.00    Types: Cigarettes   Smokeless tobacco: Never  Vaping Use   Vaping Use: Never used  Substance and Sexual Activity   Alcohol use: No    Alcohol/week: 0.0 standard drinks of alcohol   Drug use: No   Sexual activity: Not Currently  Other Topics Concern   Not on file  Social History Narrative   Not on file   Social Determinants of Health   Financial Resource Strain: Low Risk  (08/09/2022)  Overall Financial  Resource Strain (CARDIA)    Difficulty of Paying Living Expenses: Not hard at all  Food Insecurity: No Food Insecurity (08/09/2022)   Hunger Vital Sign    Worried About Running Out of Food in the Last Year: Never true    Ran Out of Food in the Last Year: Never true  Transportation Needs: No Transportation Needs (08/09/2022)   PRAPARE - Administrator, Civil Service (Medical): No    Lack of Transportation (Non-Medical): No  Physical Activity: Inactive (08/09/2022)   Exercise Vital Sign    Days of Exercise per Week: 0 days    Minutes of Exercise per Session: 0 min  Stress: No Stress Concern Present (08/09/2022)   [employer] of Occupational Health - Occupational Stress Questionnaire    Feeling of Stress : Not at all  Social Connections: Moderately Isolated (08/09/2022)   Social Connection and Isolation Panel [NHANES]    Frequency of Communication with Friends and Family: More than three times a week    Frequency of Social Gatherings with Friends and Family: Twice a week    Attends Religious Services: Never    Database administrator or Organizations: No    Attends Banker Meetings: Never    Marital Status: Married  Catering manager Violence: Not At Risk (08/09/2022)   Humiliation, Afraid, Rape, and Kick questionnaire    Fear of Current or Ex-Partner: No    Emotionally Abused: No    Physically Abused: No    Sexually Abused: No     Current Outpatient Medications:    albuterol (VENTOLIN HFA) 108 (90 Base) MCG/ACT inhaler, INHALE 2 PUFFS INTO THE LUNGS EVERY 6 HOURS AS NEEDED FOR WHEEZING ORSHORTNESS OF BREATH, Disp: 18 g, Rfl: 1   aspirin EC 81 MG tablet, Take 1 tablet (81 mg total) by mouth daily. Swallow whole., Disp: 30 tablet, Rfl: 12   Cyanocobalamin (B-12) 1000 MCG SUBL, Place 1 tablet under the tongue daily., Disp: 100 tablet, Rfl: 1   Fluticasone-Umeclidin-Vilant (TRELEGY ELLIPTA) 100-62.5-25 MCG/ACT AEPB, Inhale 1 puff into the lungs daily. In place of  Brezti, Disp: 3 each, Rfl: 3   rosuvastatin (CRESTOR) 10 MG tablet, Take 1 tablet (10 mg total) by mouth daily., Disp: 90 tablet, Rfl: 3   tamsulosin (FLOMAX) 0.4 MG CAPS capsule, Take 1 capsule (0.4 mg total) by mouth daily., Disp: 90 capsule, Rfl: 0  No Known Allergies   ROS  Constitutional: Negative for fever or weight change.  Respiratory: positive  for cough and shortness of breath.   Cardiovascular: Negative for chest pain or palpitations.  Gastrointestinal: Negative for abdominal pain, no bowel changes.  Musculoskeletal: Negative for gait problem or joint swelling.  Skin: Negative for rash.  Neurological: Negative for dizziness or headache.  No other specific complaints in a complete review of systems (except as listed in HPI above).    Objective  Vitals:   08/09/22 1508  BP: 124/72  Pulse: 84  Resp: 16  Temp: 98.6 F (37 C)  TempSrc: Oral  SpO2: 97%  Weight: 209 lb 1.6 oz (94.8 kg)  Height: 6' (1.829 m)    Body mass index is 28.36 kg/m.  Physical Exam  Constitutional: Patient appears well-developed and well-nourished. No distress.  HENT: Head: Normocephalic and atraumatic. Ears: B TMs ok, no erythema or effusion; Nose: Nose normal. Mouth/Throat: Oropharynx is clear and moist. No oropharyngeal exudate.  Eyes: Conjunctivae and EOM are normal. Pupils are equal, round, and reactive to light. No  scleral icterus.  Neck: Normal range of motion. Neck supple. No JVD present. No thyromegaly present.  Cardiovascular: Normal rate, regular rhythm and normal heart sounds.  No murmur heard. No BLE edema. Pulmonary/Chest: Effort normal , diffuse  rhonchi No respiratory distress. Abdominal: Soft. Bowel sounds are normal, no distension. There is no tenderness. no masses MALE GENITALIA: right testes harder and twice as large as the left side, no hernias, no lesions, no discharge RECTAL: refused  Musculoskeletal: Normal range of motion, no joint effusions. No gross  deformities Neurological: he is alert and oriented to person, place, and time. No cranial nerve deficit. Coordination, balance, strength, speech and gait are normal.  Skin: Skin is warm and dry. No rash noted. No erythema.  Psychiatric: Patient has a normal mood and affect. behavior is normal. Judgment and thought content normal.   Recent Results (from the past 2160 hour(s))  Fecal Globin By Immunochemistry     Status: Abnormal   Collection Time: 06/23/22 12:00 AM  Result Value Ref Range   MICRO NUMBER: [id number]    SPECIMEN QUALITY: Adequate    Source: INSURE (TM) FOBT TEST CARD    STATUS: FINAL    FECAL GLOBIN RESULT: Detected (A)     Comment: Detected     Fall Risk:    08/09/2022    3:15 PM 07/26/2022    2:27 PM 05/03/2022    3:09 PM 08/25/2021    9:54 AM 07/08/2021    1:19 PM  Fall Risk   Falls in the past year? 0 0 0 0 0  Number falls in past yr:  0 0 0 0  Injury with Fall?  0 0 0 0  Risk for fall due to :  No Fall Risks No Fall Risks No Fall Risks No Fall Risks  Follow up Falls prevention discussed;Education provided;Falls evaluation completed Falls prevention discussed Falls prevention discussed Falls prevention discussed Falls prevention discussed     Functional Status Survey: Is the patient deaf or have difficulty hearing?: No Does the patient have difficulty seeing, even when wearing glasses/contacts?: No Does the patient have difficulty concentrating, remembering, or making decisions?: No Does the patient have difficulty walking or climbing stairs?: No Does the patient have difficulty dressing or bathing?: No Does the patient have difficulty doing errands alone such as visiting a doctor's office or shopping?: No    Assessment & Plan  1. Well adult exam   2. Hyperglycemia  - Hemoglobin A1c  3. B12 deficiency  - B12 and Folate Panel  4. Long-term use of high-risk medication  - CBC with Differential/Platelet - COMPLETE METABOLIC PANEL WITH GFR  5. Lower  urinary tract symptoms (LUTS)  - PSA - tamsulosin (FLOMAX) 0.4 MG CAPS capsule; Take 1 capsule (0.4 mg total) by mouth daily.  Dispense: 90 capsule; Refill: 0 - referral urologist   6. Thoracic aorta atherosclerosis (HCC)  - Lipid panel  7. Other emphysema (HCC)  - Ambulatory referral to Pulmonology   8. Testicular abnormality  - Ambulatory referral to Urology  9. Hematuria, microscopic  - Ambulatory referral to Urology - Urinalysis, Complete    -Prostate cancer screening and PSA options (with potential risks and benefits of testing vs not testing) were discussed along with recent recs/guidelines. -USPSTF grade A and B recommendations reviewed with patient; age-appropriate recommendations, preventive care, screening tests, etc discussed and encouraged; healthy living encouraged; see AVS for patient education given to patient -Discussed importance of 150 minutes of physical activity weekly, eat two servings of  fish weekly, eat one serving of tree nuts ( cashews, pistachios, pecans, almonds.Marland Kitchen) every other day, eat 6 servings of fruit/vegetables daily and drink plenty of water and avoid sweet beverages.  -Reviewed Health Maintenance: yes

## 2022-08-09 ENCOUNTER — Ambulatory Visit: Payer: Medicare PPO | Admitting: Physician Assistant

## 2022-08-09 ENCOUNTER — Ambulatory Visit (INDEPENDENT_AMBULATORY_CARE_PROVIDER_SITE_OTHER): Payer: Medicare PPO | Admitting: Family Medicine

## 2022-08-09 ENCOUNTER — Encounter: Payer: Self-pay | Admitting: Family Medicine

## 2022-08-09 VITALS — BP 124/72 | HR 84 | Temp 98.6°F | Resp 16 | Ht 72.0 in | Wt 209.1 lb

## 2022-08-09 DIAGNOSIS — Z0000 Encounter for general adult medical examination without abnormal findings: Secondary | ICD-10-CM

## 2022-08-09 DIAGNOSIS — N509 Disorder of male genital organs, unspecified: Secondary | ICD-10-CM

## 2022-08-09 DIAGNOSIS — R739 Hyperglycemia, unspecified: Secondary | ICD-10-CM | POA: Diagnosis not present

## 2022-08-09 DIAGNOSIS — R399 Unspecified symptoms and signs involving the genitourinary system: Secondary | ICD-10-CM

## 2022-08-09 DIAGNOSIS — J438 Other emphysema: Secondary | ICD-10-CM

## 2022-08-09 DIAGNOSIS — E538 Deficiency of other specified B group vitamins: Secondary | ICD-10-CM | POA: Diagnosis not present

## 2022-08-09 DIAGNOSIS — Z79899 Other long term (current) drug therapy: Secondary | ICD-10-CM

## 2022-08-09 DIAGNOSIS — I700 Atherosclerosis of aorta: Secondary | ICD-10-CM

## 2022-08-09 DIAGNOSIS — R3129 Other microscopic hematuria: Secondary | ICD-10-CM

## 2022-08-09 MED ORDER — TAMSULOSIN HCL 0.4 MG PO CAPS
0.4000 mg | ORAL_CAPSULE | Freq: Every day | ORAL | 0 refills | Status: DC
Start: 2022-08-09 — End: 2022-11-04

## 2022-08-09 NOTE — Progress Notes (Deleted)
Celso Amy, PA-C [address]  [address]  [place], [place] [address]  Main: [phone]  Fax: [phone]   Gastroenterology Consultation  Referring Provider:     Alba Cory, MD Primary Care Physician:  Alba Cory, MD Primary Gastroenterologist:  *** Reason for Consultation:     ***        HPI:   Stephen [name] is a 67 y.o. y/o male referred for consultation & management  by Alba Cory, MD.  ***  Past Medical History:  Diagnosis Date   Allergy    seasonal   Anxiety    COPD (chronic obstructive pulmonary disease) (HCC)     No past surgical history on file.  Prior to Admission medications   Medication Sig Start Date End Date Taking? Authorizing Provider  albuterol (VENTOLIN HFA) 108 (90 Base) MCG/ACT inhaler INHALE 2 PUFFS INTO THE LUNGS EVERY 6 HOURS AS NEEDED FOR WHEEZING ORSHORTNESS OF BREATH 08/25/21   Alba Cory, MD  aspirin EC 81 MG tablet Take 1 tablet (81 mg total) by mouth daily. Swallow whole. 08/25/21   Alba Cory, MD  benzonatate (TESSALON) 100 MG capsule Take 1-2 capsules (100-200 mg total) by mouth 2 (two) times daily as needed. 07/26/22   Alba Cory, MD  Cyanocobalamin (B-12) 1000 MCG SUBL Place 1 tablet under the tongue daily. 05/03/22   Alba Cory, MD  Fluticasone-Umeclidin-Vilant (TRELEGY ELLIPTA) 100-62.5-25 MCG/ACT AEPB Inhale 1 puff into the lungs daily. In place of [facility] 05/19/22   Alba Cory, MD  methylPREDNISolone (MEDROL DOSEPAK) 4 MG TBPK tablet Take as directed 07/26/22   Alba Cory, MD  rosuvastatin (CRESTOR) 10 MG tablet Take 1 tablet (10 mg total) by mouth daily. 05/03/22   Alba Cory, MD    Family History  Problem Relation Age of Onset   Hypertension Mother    Hypothyroidism Mother    Alzheimer's disease Father      Social History   Tobacco Use   Smoking status: Every Day    Packs/day: 1.00    Years: 45.00    Additional pack years: 0.00    Total pack years: 45.00    Types:  Cigarettes   Smokeless tobacco: Never  Vaping Use   Vaping Use: Never used  Substance Use Topics   Alcohol use: No    Alcohol/week: 0.0 standard drinks of alcohol   Drug use: No    Allergies as of 08/09/2022   (No Known Allergies)    Review of Systems:    All systems reviewed and negative except where noted in HPI.   Physical Exam:  There were no vitals taken for this visit. No LMP for male patient. Psych:  Alert and cooperative. Normal mood and affect. General:   Alert,  Well-developed, well-nourished, pleasant and cooperative in NAD Head:  Normocephalic and atraumatic. Eyes:  Sclera clear, no icterus.   Conjunctiva pink. Ears:  Normal auditory acuity. Neck:  Supple; no masses or thyromegaly. Lungs:  Respirations even and unlabored.  Clear throughout to auscultation.   No wheezes, crackles, or rhonchi. No acute distress. Heart:  Regular rate and rhythm; no murmurs, clicks, rubs, or gallops. Abdomen:  Normal bowel sounds.  No bruits.  Soft, non-tender and non-distended without masses, hepatosplenomegaly or hernias noted.  No guarding or rebound tenderness.    Neurologic:  Alert and oriented x3;  grossly normal neurologically. Psych:  Alert and cooperative. Normal mood and affect.  Imaging Studies: No results found.  Assessment and Plan:   Stephen [name] is  a 66 y.o. y/o male has been referred for ***  Follow up in ***  Celso Amy, PA-C    BP check ***

## 2022-08-10 LAB — PSA: PSA: 0.09 ng/mL (ref <=4.00)

## 2022-08-10 LAB — URINALYSIS, COMPLETE
Bacteria, UA: NONE SEEN /HPF
Bilirubin Urine: NEGATIVE
Glucose, UA: NEGATIVE
Hyaline Cast: NONE SEEN /LPF
Ketones, ur: NEGATIVE
Leukocytes,Ua: NEGATIVE
Nitrite: NEGATIVE
Protein, ur: NEGATIVE
Specific Gravity, Urine: 1.024 (ref 1.001–1.035)
Squamous Epithelial / HPF: NONE SEEN /HPF (ref <=5)
pH: 5.5 (ref 5.0–8.0)

## 2022-08-10 LAB — CBC WITH DIFFERENTIAL/PLATELET
Absolute Monocytes: 584 cells/uL (ref 200–950)
Basophils Absolute: 58 cells/uL (ref 0–200)
Basophils Relative: 0.8 %
Eosinophils Absolute: 321 cells/uL (ref 15–500)
Eosinophils Relative: 4.4 %
HCT: 44.9 % (ref 38.5–50.0)
Hemoglobin: 15.1 g/dL (ref 13.2–17.1)
Lymphs Abs: 1913 cells/uL (ref 850–3900)
MCH: 32.3 pg (ref 27.0–33.0)
MCHC: 33.6 g/dL (ref 32.0–36.0)
MCV: 96.1 fL (ref 80.0–100.0)
MPV: 9.6 fL (ref 7.5–12.5)
Monocytes Relative: 8 %
Neutro Abs: 4424 cells/uL (ref 1500–7800)
Neutrophils Relative %: 60.6 %
Platelets: 268 10*3/uL (ref 140–400)
RBC: 4.67 10*6/uL (ref 4.20–5.80)
RDW: 12.6 % (ref 11.0–15.0)
Total Lymphocyte: 26.2 %
WBC: 7.3 10*3/uL (ref 3.8–10.8)

## 2022-08-10 LAB — COMPLETE METABOLIC PANEL WITH GFR
AG Ratio: 1.5 (calc) (ref 1.0–2.5)
ALT: 12 U/L (ref 9–46)
AST: 12 U/L (ref 10–35)
Albumin: 4 g/dL (ref 3.6–5.1)
Alkaline phosphatase (APISO): 66 U/L (ref 35–144)
BUN: 14 mg/dL (ref 7–25)
CO2: 30 mmol/L (ref 20–32)
Calcium: 9.3 mg/dL (ref 8.6–10.3)
Chloride: 101 mmol/L (ref 98–110)
Creat: 0.83 mg/dL (ref 0.70–1.35)
Globulin: 2.7 g/dL (calc) (ref 1.9–3.7)
Glucose, Bld: 89 mg/dL (ref 65–99)
Potassium: 4.5 mmol/L (ref 3.5–5.3)
Sodium: 137 mmol/L (ref 135–146)
Total Bilirubin: 0.5 mg/dL (ref 0.2–1.2)
Total Protein: 6.7 g/dL (ref 6.1–8.1)
eGFR: 96 mL/min/{1.73_m2} (ref >=60)

## 2022-08-10 LAB — LIPID PANEL
Cholesterol: 119 mg/dL (ref <200)
HDL: 44 mg/dL (ref >=40)
LDL Cholesterol (Calc): 58 mg/dL (calc)
Non-HDL Cholesterol (Calc): 75 mg/dL (calc) (ref <130)
Total CHOL/HDL Ratio: 2.7 (calc) (ref <5.0)
Triglycerides: 86 mg/dL (ref <150)

## 2022-08-10 LAB — HEMOGLOBIN A1C
Hgb A1c MFr Bld: 6.5 % of total Hgb — ABNORMAL HIGH (ref <5.7)
Mean Plasma Glucose: 140 mg/dL
eAG (mmol/L): 7.7 mmol/L

## 2022-08-10 LAB — B12 AND FOLATE PANEL
Folate: 11 ng/mL
Vitamin B-12: 783 pg/mL (ref 200–1100)

## 2022-08-13 ENCOUNTER — Other Ambulatory Visit: Payer: Self-pay | Admitting: Family Medicine

## 2022-08-13 DIAGNOSIS — E538 Deficiency of other specified B group vitamins: Secondary | ICD-10-CM

## 2022-08-18 ENCOUNTER — Ambulatory Visit (INDEPENDENT_AMBULATORY_CARE_PROVIDER_SITE_OTHER): Payer: Medicare PPO

## 2022-08-18 VITALS — Ht 72.0 in | Wt 209.0 lb

## 2022-08-18 DIAGNOSIS — Z0000 Encounter for general adult medical examination without abnormal findings: Secondary | ICD-10-CM | POA: Diagnosis not present

## 2022-08-18 NOTE — Patient Instructions (Signed)
[name] , Thank you for taking time to come for your Medicare Wellness Visit. I appreciate your ongoing commitment to your health goals. Please review the following plan we discussed and let me know if I can assist you in the future.   These are the goals we discussed:  Goals   None     This is a list of the screening recommended for you and due dates:  Health Maintenance  Topic Date Due   COVID-19 Vaccine (1) Never done   Screening for Lung Cancer  09/15/2021   Zoster (Shingles) Vaccine (1 of 2) 10/25/2022*   Cologuard (Stool DNA test)  07/26/2023*   Flu Shot  11/03/2022   Medicare Annual Wellness Visit  08/18/2023   Pneumonia Vaccine  Completed   Hepatitis C Screening: USPSTF Recommendation to screen - Ages 18-79 yo.  Completed   HPV Vaccine  Aged Out   DTaP/Tdap/Td vaccine  Discontinued  *Topic was postponed. The date shown is not the original due date.    Advanced directives: no  Conditions/risks identified: none  Next appointment: Follow up in one year for your annual wellness visit 08/24/2023 @3 :30pm telephone   Preventive Care 65 Years and Older, Male Preventive care refers to lifestyle choices and visits with your health care provider that can promote health and wellness. What does preventive care include? A yearly physical exam. This is also called an annual well check. Dental exams once or twice a year. Routine eye exams. Ask your health care provider how often you should have your eyes checked. Personal lifestyle choices, including: Daily care of your teeth and gums. Regular physical activity. Eating a healthy diet. Avoiding tobacco and drug use. Limiting alcohol use. Practicing safe sex. Taking low-dose aspirin every day. Taking vitamin and mineral supplements as recommended by your health care provider. What happens during an annual well check? The services and screenings done by your health care provider during your annual well check will depend on  your age, overall health, lifestyle risk factors, and family history of disease. Counseling  Your health care provider may ask you questions about your: Alcohol use. Tobacco use. Drug use. Emotional well-being. Home and relationship well-being. Sexual activity. Eating habits. History of falls. Memory and ability to understand (cognition). Work and work Astronomer. Reproductive health. Screening  You may have the following tests or measurements: Height, weight, and BMI. Blood pressure. Lipid and cholesterol levels. These may be checked every 5 years, or more frequently if you are over 105 years old. Skin check. Lung cancer screening. You may have this screening every year starting at age 18 if you have a 30-pack-year history of smoking and currently smoke or have quit within the past 15 years. Fecal occult blood test (FOBT) of the stool. You may have this test every year starting at age 76. Flexible sigmoidoscopy or colonoscopy. You may have a sigmoidoscopy every 5 years or a colonoscopy every 10 years starting at age 68. Hepatitis C blood test. Hepatitis B blood test. Sexually transmitted disease (STD) testing. Diabetes screening. This is done by checking your blood sugar (glucose) after you have not eaten for a while (fasting). You may have this done every 1-3 years. Bone density scan. This is done to screen for osteoporosis. You may have this done starting at age 40. Mammogram. This may be done every 1-2 years. Talk to your health care provider about how often you should have regular mammograms. Talk with your health care provider about your test results, treatment  options, and if necessary, the need for more tests. Vaccines  Your health care provider may recommend certain vaccines, such as: Influenza vaccine. This is recommended every year. Tetanus, diphtheria, and acellular pertussis (Tdap, Td) vaccine. You may need a Td booster every 10 years. Zoster vaccine. You may need this  after age 2. Pneumococcal 13-valent conjugate (PCV13) vaccine. One dose is recommended after age 12. Pneumococcal polysaccharide (PPSV23) vaccine. One dose is recommended after age 71. Talk to your health care provider about which screenings and vaccines you need and how often you need them. This information is not intended to replace advice given to you by your health care provider. Make sure you discuss any questions you have with your health care provider. Document Released: 04/17/2015 Document Revised: 12/09/2015 Document Reviewed: 01/20/2015 Elsevier Interactive Patient Education  2017 [employer] Prevention in the Home Falls can cause injuries. They can happen to people of all ages. There are many things you can do to make your home safe and to help prevent falls. What can I do on the outside of my home? Regularly fix the edges of walkways and driveways and fix any cracks. Remove anything that might make you trip as you walk through a door, such as a raised step or threshold. Trim any bushes or trees on the path to your home. Use bright outdoor lighting. Clear any walking paths of anything that might make someone trip, such as rocks or tools. Regularly check to see if handrails are loose or broken. Make sure that both sides of any steps have handrails. Any raised decks and porches should have guardrails on the edges. Have any leaves, snow, or ice cleared regularly. Use sand or salt on walking paths during winter. Clean up any spills in your garage right away. This includes oil or grease spills. What can I do in the bathroom? Use night lights. Install grab bars by the toilet and in the tub and shower. Do not use towel bars as grab bars. Use non-skid mats or decals in the tub or shower. If you need to sit down in the shower, use a plastic, non-slip stool. Keep the floor dry. Clean up any water that spills on the floor as soon as it happens. Remove soap buildup in the tub or  shower regularly. Attach bath mats securely with double-sided non-slip rug tape. Do not have throw rugs and other things on the floor that can make you trip. What can I do in the bedroom? Use night lights. Make sure that you have a light by your bed that is easy to reach. Do not use any sheets or blankets that are too big for your bed. They should not hang down onto the floor. Have a firm chair that has side arms. You can use this for support while you get dressed. Do not have throw rugs and other things on the floor that can make you trip. What can I do in the kitchen? Clean up any spills right away. Avoid walking on wet floors. Keep items that you use a lot in easy-to-reach places. If you need to reach something above you, use a strong step stool that has a grab bar. Keep electrical cords out of the way. Do not use floor polish or wax that makes floors slippery. If you must use wax, use non-skid floor wax. Do not have throw rugs and other things on the floor that can make you trip. What can I do with my stairs? Do not  leave any items on the stairs. Make sure that there are handrails on both sides of the stairs and use them. Fix handrails that are broken or loose. Make sure that handrails are as long as the stairways. Check any carpeting to make sure that it is firmly attached to the stairs. Fix any carpet that is loose or worn. Avoid having throw rugs at the top or bottom of the stairs. If you do have throw rugs, attach them to the floor with carpet tape. Make sure that you have a light switch at the top of the stairs and the bottom of the stairs. If you do not have them, ask someone to add them for you. What else can I do to help prevent falls? Wear shoes that: Do not have high heels. Have rubber bottoms. Are comfortable and fit you well. Are closed at the toe. Do not wear sandals. If you use a stepladder: Make sure that it is fully opened. Do not climb a closed stepladder. Make  sure that both sides of the stepladder are locked into place. Ask someone to hold it for you, if possible. Clearly mark and make sure that you can see: Any grab bars or handrails. First and last steps. Where the edge of each step is. Use tools that help you move around (mobility aids) if they are needed. These include: Canes. Walkers. Scooters. Crutches. Turn on the lights when you go into a dark area. Replace any light bulbs as soon as they burn out. Set up your furniture so you have a clear path. Avoid moving your furniture around. If any of your floors are uneven, fix them. If there are any pets around you, be aware of where they are. Review your medicines with your doctor. Some medicines can make you feel dizzy. This can increase your chance of falling. Ask your doctor what other things that you can do to help prevent falls. This information is not intended to replace advice given to you by your health care provider. Make sure you discuss any questions you have with your health care provider. Document Released: 01/15/2009 Document Revised: 08/27/2015 Document Reviewed: 04/25/2014 Elsevier Interactive Patient Education  2017 [employer].

## 2022-08-18 NOTE — Progress Notes (Signed)
I connected with  Stephen [name] on 08/18/22 by a audio enabled telemedicine application and verified that I am speaking with the correct person using two identifiers.  Patient Location: Home  Provider Location: Office/Clinic  I discussed the limitations of evaluation and management by telemedicine. The patient expressed understanding and agreed to proceed.  Subjective:   Stephen [name] is a 67 y.o. male who presents for Medicare Annual/Subsequent preventive examination.  Review of Systems          Objective:    Today's Vitals   08/18/22 1540 08/18/22 1541  Weight: 209 lb (94.8 kg)   Height: 6' (1.829 m)   PainSc:  0-No pain   Body mass index is 28.35 kg/m.     08/18/2022    3:50 PM 07/08/2021    1:16 PM 05/18/2018   10:12 AM 03/15/2018    7:24 AM 01/23/2017    1:31 PM 08/22/2016    8:49 AM 05/18/2016    9:09 AM  Advanced Directives  Does Patient Have a Medical Advance Directive? No No No No No No No  Would patient like information on creating a medical advance directive?  No - Patient declined No - Patient declined No - Patient declined       Current Medications (verified) Outpatient Encounter Medications as of 08/18/2022  Medication Sig   albuterol (VENTOLIN HFA) 108 (90 Base) MCG/ACT inhaler INHALE 2 PUFFS INTO THE LUNGS EVERY 6 HOURS AS NEEDED FOR WHEEZING ORSHORTNESS OF BREATH   aspirin EC 81 MG tablet Take 1 tablet (81 mg total) by mouth daily. Swallow whole.   Fluticasone-Umeclidin-Vilant (TRELEGY ELLIPTA) 100-62.5-25 MCG/ACT AEPB Inhale 1 puff into the lungs daily. In place of Brezti   Methylcobalamin (B-12) 1000 MCG TBDP PLACE 1 TABLET UNDER THE TONGUE DAILY   rosuvastatin (CRESTOR) 10 MG tablet Take 1 tablet (10 mg total) by mouth daily.   tamsulosin (FLOMAX) 0.4 MG CAPS capsule Take 1 capsule (0.4 mg total) by mouth daily.   No facility-administered encounter medications on file as of 08/18/2022.    Allergies (verified) Patient has no known allergies.    History: Past Medical History:  Diagnosis Date   Allergy    seasonal   Anxiety    COPD (chronic obstructive pulmonary disease) (HCC)    No past surgical history on file. Family History  Problem Relation Age of Onset   Hypertension Mother    Hypothyroidism Mother    Alzheimer's disease Father    Social History   Socioeconomic History   Marital status: Married    Spouse name: Not on file   Number of children: Not on file   Years of education: Not on file   Highest education level: Not on file  Occupational History   Not on file  Tobacco Use   Smoking status: Every Day    Packs/day: 1.00    Years: 45.00    Additional pack years: 0.00    Total pack years: 45.00    Types: Cigarettes   Smokeless tobacco: Never  Vaping Use   Vaping Use: Never used  Substance and Sexual Activity   Alcohol use: No    Alcohol/week: 0.0 standard drinks of alcohol   Drug use: No   Sexual activity: Not Currently  Other Topics Concern   Not on file  Social History Narrative   Not on file   Social Determinants of Health   Financial Resource Strain: Low Risk  (08/18/2022)   Overall Financial Resource Strain (CARDIA)    Difficulty  of Paying Living Expenses: Not hard at all  Food Insecurity: No Food Insecurity (08/18/2022)   Hunger Vital Sign    Worried About Running Out of Food in the Last Year: Never true    Ran Out of Food in the Last Year: Never true  Transportation Needs: No Transportation Needs (08/18/2022)   PRAPARE - Administrator, Civil Service (Medical): No    Lack of Transportation (Non-Medical): No  Physical Activity: Inactive (08/18/2022)   Exercise Vital Sign    Days of Exercise per Week: 0 days    Minutes of Exercise per Session: 0 min  Stress: No Stress Concern Present (08/18/2022)   [employer] of Occupational Health - Occupational Stress Questionnaire    Feeling of Stress : Not at all  Social Connections: Moderately Isolated (08/18/2022)   Social  Connection and Isolation Panel [NHANES]    Frequency of Communication with Friends and Family: More than three times a week    Frequency of Social Gatherings with Friends and Family: Never    Attends Religious Services: Never    Database administrator or Organizations: No    Attends Engineer, structural: Never    Marital Status: Married    Tobacco Counseling Ready to quit: Not Answered Counseling given: Not Answered   Clinical Intake:  Pre-visit preparation completed: Yes  Pain : No/denies pain Pain Score: 0-No pain   BMI - recorded: 28.35 Nutritional Status: BMI 25 -29 Overweight Nutritional Risks: Nausea/ vomitting/ diarrhea (intermittent nausea over the last year) Diabetes: No  How often do you need to have someone help you when you read instructions, pamphlets, or other written materials from your doctor or pharmacy?: 1 - Never  Diabetic?no  Interpreter Needed?: No  Comments: lives with wife Information entered by :: B.Clarine Elrod,LPN   Activities of Daily Living    08/18/2022    3:50 PM 08/09/2022    3:16 PM  In your present state of health, do you have any difficulty performing the following activities:  Hearing? 0 0  Vision? 1 0  Difficulty concentrating or making decisions? 0 0  Walking or climbing stairs? 0 0  Dressing or bathing? 0 0  Doing errands, shopping? 0 0  Preparing Food and eating ? N   Using the Toilet? N   In the past six months, have you accidently leaked urine? N   Do you have problems with loss of bowel control? N   Managing your Medications? N   Managing your Finances? N   Housekeeping or managing your Housekeeping? N     Patient Care Team: Alba Cory, MD as PCP - General (Family Medicine)  Indicate any recent Medical Services you may have received from other than [facility] providers in the past year (date may be approximate).     Assessment:   This is a routine wellness examination for Stephen [name].  Hearing/Vision screen Hearing  Screening - Comments:: Hearing adequate Vision Screening - Comments:: Vision blurry in rt eye;not well in lft  Dietary issues and exercise activities discussed:     Goals Addressed   None   Depression Screen    08/18/2022    3:45 PM 08/09/2022    3:16 PM 07/26/2022    2:27 PM 05/03/2022    3:10 PM 08/25/2021    9:56 AM 07/08/2021    1:15 PM 11/17/2020    2:18 PM  PHQ 2/9 Scores  PHQ - 2 Score 0 0 0 0 0 0 0  PHQ- 9  Score  2 0 0 0      Fall Risk    08/18/2022    3:44 PM 08/09/2022    3:15 PM 07/26/2022    2:27 PM 05/03/2022    3:09 PM 08/25/2021    9:54 AM  Fall Risk   Falls in the past year? 0 0 0 0 0  Number falls in past yr: 0  0 0 0  Injury with Fall? 0  0 0 0  Risk for fall due to : No Fall Risks  No Fall Risks No Fall Risks No Fall Risks  Follow up Education provided;Falls prevention discussed Falls prevention discussed;Education provided;Falls evaluation completed Falls prevention discussed Falls prevention discussed Falls prevention discussed    FALL RISK PREVENTION PERTAINING TO THE HOME:  Any stairs in or around the home? No  If so, are there any without handrails? No  Home free of loose throw rugs in walkways, pet beds, electrical cords, etc? Yes  Adequate lighting in your home to reduce risk of falls? Yes   ASSISTIVE DEVICES UTILIZED TO PREVENT FALLS:  Life alert? No  Use of a cane, walker or w/c? No  Grab bars in the bathroom? Yes  Shower chair or bench in shower? No  Elevated toilet seat or a handicapped toilet? No    Cognitive Function:        08/18/2022    3:50 PM  6CIT Screen  What Year? 0 points  What month? 0 points  What time? 0 points  Count back from 20 0 points  Months in reverse 0 points  Repeat phrase 0 points  Total Score 0 points    Immunizations Immunization History  Administered Date(s) Administered   PNEUMOCOCCAL CONJUGATE-20 08/25/2021    TDAP status: Up to date  Flu Vaccine status: Declined, Education has been provided  regarding the importance of this vaccine but patient still declined. Advised may receive this vaccine at local pharmacy or Health Dept. Aware to provide a copy of the vaccination record if obtained from local pharmacy or Health Dept. Verbalized acceptance and understanding.  Pneumococcal vaccine status: Up to date  Covid-19 vaccine status: Declined, Education has been provided regarding the importance of this vaccine but patient still declined. Advised may receive this vaccine at local pharmacy or Health Dept.or vaccine clinic. Aware to provide a copy of the vaccination record if obtained from local pharmacy or Health Dept. Verbalized acceptance and understanding.  Qualifies for Shingles Vaccine? Yes   Zostavax completed No   Shingrix Completed?: No.    Education has been provided regarding the importance of this vaccine. Patient has been advised to call insurance company to determine out of pocket expense if they have not yet received this vaccine. Advised may also receive vaccine at local pharmacy or Health Dept. Verbalized acceptance and understanding.  Screening Tests Health Maintenance  Topic Date Due   COVID-19 Vaccine (1) Never done   Lung Cancer Screening  09/15/2021   Zoster Vaccines- Shingrix (1 of 2) 10/25/2022 (Originally 05/20/1974)   Fecal DNA (Cologuard)  07/26/2023 (Originally 05/20/2000)   INFLUENZA VACCINE  11/03/2022   Medicare Annual Wellness (AWV)  08/18/2023   Pneumonia Vaccine 63+ Years old  Completed   Hepatitis C Screening  Completed   HPV VACCINES  Aged Out   DTaP/Tdap/Td  Discontinued    Health Maintenance  Health Maintenance Due  Topic Date Due   COVID-19 Vaccine (1) Never done   Lung Cancer Screening  09/15/2021    Colorectal cancer  screening: Type of screening: Cologuard. Completed yes. Repeat every 3 years  Lung Cancer Screening: (Low Dose CT Chest recommended if Age 54-80 years, 30 pack-year currently smoking OR have quit w/in 15years.) does qualify.    Lung Cancer Screening Referral: no has appt already  Additional Screening:  Hepatitis C Screening: does not qualify; Completed yes  Vision Screening: Recommended annual ophthalmology exams for early detection of glaucoma and other disorders of the eye. Is the patient up to date with their annual eye exam?  No  Who is the provider or what is the name of the office in which the patient attends annual eye exams? None..states he will check with insurance to see in network then schedule If pt is not established with a provider, would they like to be referred to a provider to establish care? No .   Dental Screening: Recommended annual dental exams for proper oral hygiene  Community Resource Referral / Chronic Care Management: CRR required this visit?  No   CCM required this visit?  No      Plan:     I have personally reviewed and noted the following in the patient's chart:   Medical and social history Use of alcohol, tobacco or illicit drugs  Current medications and supplements including opioid prescriptions. Patient is not currently taking opioid prescriptions. Functional ability and status Nutritional status Physical activity Advanced directives List of other physicians Hospitalizations, surgeries, and ER visits in previous 12 months Vitals Screenings to include cognitive, depression, and falls Referrals and appointments  In addition, I have reviewed and discussed with patient certain preventive protocols, quality metrics, and best practice recommendations. A written personalized care plan for preventive services as well as general preventive health recommendations were provided to patient.     Sue Lush, LPN   [id number]   Nurse Notes: The patient states he is doing well and has no concerns or questions at this time.

## 2022-08-22 ENCOUNTER — Encounter: Payer: Self-pay | Admitting: Physician Assistant

## 2022-08-22 ENCOUNTER — Ambulatory Visit: Payer: Medicare PPO | Admitting: Physician Assistant

## 2022-08-22 VITALS — BP 134/80 | HR 84 | Temp 98.7°F | Ht 72.0 in | Wt 207.0 lb

## 2022-08-22 DIAGNOSIS — R195 Other fecal abnormalities: Secondary | ICD-10-CM | POA: Diagnosis not present

## 2022-08-22 DIAGNOSIS — R079 Chest pain, unspecified: Secondary | ICD-10-CM | POA: Diagnosis not present

## 2022-08-22 DIAGNOSIS — R1310 Dysphagia, unspecified: Secondary | ICD-10-CM | POA: Diagnosis not present

## 2022-08-22 DIAGNOSIS — I2510 Atherosclerotic heart disease of native coronary artery without angina pectoris: Secondary | ICD-10-CM

## 2022-08-22 DIAGNOSIS — J441 Chronic obstructive pulmonary disease with (acute) exacerbation: Secondary | ICD-10-CM | POA: Diagnosis not present

## 2022-08-22 NOTE — Progress Notes (Signed)
Celso Amy, PA-C [address]  [address]  [place], [place] [address]  Main: [phone]  Fax: [phone]   Gastroenterology Consultation  Referring Provider:     Alba Cory, MD Primary Care Physician:  Alba Cory, MD Primary Gastroenterologist:  Celso Amy, PA-C / Dr. Wyline Mood  Reason for Consultation:     Positive FIT        HPI:   Stephen [name] is a 67 y.o. y/o male referred for consultation & management  by Alba Cory, MD.    Referred for positive screening FOBT FIT test.  No previous colonoscopy, EGD or GI evaluation.  Labs 08/09/2022 showed normal CBC and CMP with hemoglobin 15.1.  GI symptoms: Patient reports having mid lower chest pains that come and go.  He has not had any chest pain in the past week.  He has occasional painful swallowing when he eats or drinks.  It hurts in his mid lower chest.  He has not had any solid food dysphagia or vomiting.  He denies any acid regurgitation.  No current treatment for acid reflux.  He was referred to a cardiologist for chest pain, however he has not made that appointment yet.  He denies visible rectal bleeding or weight loss.  Has occasional dark stools which he attributes to taking Pepto-Bismol.  He recently stopped Pepto-Bismol and stools are brown now.  He has history of moderate to severe COPD.  Has pulmonologist.  Has been a smoker since age 32 and no desire to quit smoking.  He has also had some hematuria and has been referred to a urologist.  Medical history significant for COPD, emphysema, tobacco dependence, and atherosclerosis.  Multi vessel coronary artery atherosclerosis was seen on chest CT done 04/2020.  He has never seen a cardiologist.  He was referred to a cardiologist, however he is extremely reluctant to go to the doctor.   Past Medical History:  Diagnosis Date   Allergy    seasonal   Anxiety    CAD in native artery    COPD (chronic obstructive pulmonary disease) (HCC)    Thoracic  aortic atherosclerosis (HCC)     No past surgical history on file.  Prior to Admission medications   Medication Sig Start Date End Date Taking? Authorizing Provider  albuterol (VENTOLIN HFA) 108 (90 Base) MCG/ACT inhaler INHALE 2 PUFFS INTO THE LUNGS EVERY 6 HOURS AS NEEDED FOR WHEEZING ORSHORTNESS OF BREATH 08/25/21   Alba Cory, MD  aspirin EC 81 MG tablet Take 1 tablet (81 mg total) by mouth daily. Swallow whole. 08/25/21   Alba Cory, MD  Fluticasone-Umeclidin-Vilant (TRELEGY ELLIPTA) 100-62.5-25 MCG/ACT AEPB Inhale 1 puff into the lungs daily. In place of Brezti 05/19/22   Alba Cory, MD  Methylcobalamin (B-12) 1000 MCG TBDP PLACE 1 TABLET UNDER THE TONGUE DAILY 08/15/22   Alba Cory, MD  rosuvastatin (CRESTOR) 10 MG tablet Take 1 tablet (10 mg total) by mouth daily. 05/03/22   Alba Cory, MD  tamsulosin (FLOMAX) 0.4 MG CAPS capsule Take 1 capsule (0.4 mg total) by mouth daily. 08/09/22   Alba Cory, MD    Family History  Problem Relation Age of Onset   Hypertension Mother    Hypothyroidism Mother    Alzheimer's disease Father      Social History   Tobacco Use   Smoking status: Every Day    Packs/day: 1.00    Years: 45.00    Additional pack years: 0.00    Total pack years: 45.00  Types: Cigarettes   Smokeless tobacco: Never  Vaping Use   Vaping Use: Never used  Substance Use Topics   Alcohol use: No    Alcohol/week: 0.0 standard drinks of alcohol   Drug use: No    Allergies as of 08/22/2022   (No Known Allergies)    Review of Systems:    All systems reviewed and negative except where noted in HPI.   Physical Exam:  BP 134/80   Pulse 84   Temp 98.7 F (37.1 C) (Oral)   Ht 6' (1.829 m)   Wt 207 lb (93.9 kg)   SpO2 95%   BMI 28.07 kg/m  No LMP for male patient. Psych:  Alert and cooperative. Normal mood and affect. General:   Alert,  Well-developed, well-nourished, pleasant and cooperative in NAD Head:  Normocephalic and  atraumatic. Eyes:  Sclera clear, no icterus.   Conjunctiva pink. Neck:  Supple; no masses or thyromegaly. Lungs:  Respirations even and labored.  He breathes through pursed lips.  Moderate expiratory wheezing throughout all lung fields.  No crackles, rales or rhonchi.  No acute distress.  Not on oxygen. Heart:  Regular rate and rhythm; no murmurs, clicks, rubs, or gallops. Abdomen:  Normal bowel sounds.  No bruits.  Soft, and non-distended without masses, hepatosplenomegaly or hernias noted.  No Tenderness.  No guarding or rebound tenderness.    Neurologic:  Alert and oriented x3;  grossly normal neurologically. Psych:  Alert and cooperative. Normal mood and affect.  Imaging Studies: No results found.  Assessment and Plan:   Kailum Ouimette is a 67 y.o. y/o male has been referred for:  Positive FOBT FIT  Patient has never had a colonoscopy or EGD.  Patient labs show no anemia.  We need to schedule a colonoscopy and EGD, however he needs cardiac evaluation and cardiac clearance for chest pain before scheduling any EGD or colonoscopy procedures.  Chest Pain  Cardiology Referral  Get Cardiac Clearance for EGD & Colonoscopy  Odynophagia  Upper GI Series  If Upper GI Series shows GERD, then start Pantoprazole 40mg  daily.  Patient declined Rx for Pantoprazole today.  4.   COPD - Moderate to Severe  He is short of breath on exam today with wheezing.  Not on oxygen.  F/U with Pulmonologist.  5.   Multi vessel coronary artery atherosclerosis was seen on chest CT done 04/2020.  He has never seen a Cardiologist.  I am referring to a Cardiologist.   Follow up in After Upper GI Series and Cardiac Evaluation.  If he receives cardiac clearance, then we can schedule an EGD and colonoscopy.  Celso Amy, PA-C

## 2022-08-22 NOTE — Patient Instructions (Addendum)
We will call you with the results.   GI series x-ray scheduled 5/28/@ [facility]-check in 9:45 am. Nothing to eat/drink after midnight. Cardiology referral sent. Someone from their office will contact you to schedule an appointment.

## 2022-08-30 ENCOUNTER — Inpatient Hospital Stay: Admission: RE | Admit: 2022-08-30 | Payer: Medicare PPO | Source: Ambulatory Visit

## 2022-09-06 ENCOUNTER — Institutional Professional Consult (permissible substitution): Payer: Medicare PPO | Admitting: Student in an Organized Health Care Education/Training Program

## 2022-09-20 ENCOUNTER — Ambulatory Visit: Payer: Medicare PPO | Admitting: Urology

## 2022-09-20 ENCOUNTER — Encounter: Payer: Self-pay | Admitting: Urology

## 2022-09-27 ENCOUNTER — Other Ambulatory Visit: Payer: Self-pay

## 2022-09-27 ENCOUNTER — Institutional Professional Consult (permissible substitution): Payer: Medicare PPO | Admitting: Student in an Organized Health Care Education/Training Program

## 2022-09-27 DIAGNOSIS — J438 Other emphysema: Secondary | ICD-10-CM

## 2022-09-27 MED ORDER — TRELEGY ELLIPTA 100-62.5-25 MCG/ACT IN AEPB
1.0000 | INHALATION_SPRAY | Freq: Every day | RESPIRATORY_TRACT | 0 refills | Status: DC
Start: 2022-09-27 — End: 2022-12-14

## 2022-10-11 ENCOUNTER — Institutional Professional Consult (permissible substitution): Payer: Medicare PPO | Admitting: Pulmonary Disease

## 2022-11-04 ENCOUNTER — Other Ambulatory Visit: Payer: Self-pay | Admitting: Family Medicine

## 2022-11-04 DIAGNOSIS — R399 Unspecified symptoms and signs involving the genitourinary system: Secondary | ICD-10-CM

## 2022-11-10 ENCOUNTER — Institutional Professional Consult (permissible substitution): Payer: Medicare PPO | Admitting: Pulmonary Disease

## 2022-11-14 NOTE — Progress Notes (Deleted)
Name: Stephen [name]   MRN: [id number]    DOB: 1955/08/30   Date:11/14/2022       Progress Note  Subjective  Chief Complaint  Follow Up  HPI  Emphysema:he started smoking at age 67 and smokes on average one pack daily, not ready to quit.  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 He has to stop or use rescue inhaler to improve symptoms. He is getting Brezti from [employer] He states he will try to quit smoking on Feb 1st  2024   Chest pain: it has happened three times over the past 6-8 months. He states initially very brief. The third episode happened after walking after he smoked a cigarette in his car. He also felt hot/clammy, it lasted about 5 minutes but resolved by itself. Discussed risk of heart attack, discussed referral to cardiologist but he wants to hold off for now. He will call 911 if it happens again and lasts over 5 minutes . Also discussed other symptoms that can occur with angina   Atherosclerosis of aorta: on his CT chest seen 05/19/2019 he also has coronary calcifications - discussed starting aspirin 81 mg and statin therapy again.   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   Patient Active Problem List   Diagnosis Date Noted   Intermittent left-sided chest pain 05/03/2022   B12 deficiency 08/25/2021   Hyperglycemia 08/25/2021   Lower urinary tract symptoms (LUTS) 08/25/2021   Coronary artery calcification of native artery 08/25/2021   Hematuria, microscopic 08/25/2021   Atypical pigmented lesion 08/25/2021   Senile purpura (HCC) 11/17/2020    Hepatic cyst 10/01/2020   Smoker 08/18/2020   Thoracic aorta atherosclerosis (HCC) 04/27/2020   Current every day smoker 05/18/2016   Other emphysema (HCC) 11/18/2015    No past surgical history on file.  Family History  Problem Relation Age of Onset   Hypertension Mother    Hypothyroidism Mother    Alzheimer's disease Father     Social History   Tobacco Use   Smoking status: Every Day    Current packs/day: 1.00    Average packs/day: 1 pack/day for 45.0 years (45.0 ttl pk-yrs)    Types: Cigarettes   Smokeless tobacco: Never  Substance Use Topics   Alcohol use: No    Alcohol/week: 0.0 standard drinks of alcohol     Current Outpatient Medications:    albuterol (VENTOLIN HFA) 108 (90 Base) MCG/ACT inhaler, INHALE 2 PUFFS INTO THE LUNGS EVERY 6 HOURS AS NEEDED FOR WHEEZING ORSHORTNESS OF BREATH, Disp: 18 g, Rfl: 1   aspirin EC 81 MG tablet, Take 1 tablet (81 mg total) by mouth daily. Swallow whole., Disp: 30 tablet, Rfl: 12   Fluticasone-Umeclidin-Vilant (TRELEGY ELLIPTA) 100-62.5-25 MCG/ACT AEPB, Inhale 1 puff into the lungs daily. In place of Brezti, Disp: 3 each, Rfl: 0   Methylcobalamin (B-12) 1000 MCG TBDP, PLACE 1 TABLET UNDER THE TONGUE DAILY, Disp: 150 tablet, Rfl: 3   rosuvastatin (CRESTOR) 10 MG tablet, Take 1 tablet (10 mg total) by mouth daily., Disp:  90 tablet, Rfl: 3   tamsulosin (FLOMAX) 0.4 MG CAPS capsule, TAKE 1 CAPSULE BY MOUTH EVERY DAY, Disp: 30 capsule, Rfl: 0  No Known Allergies  I personally reviewed active problem list, medication list, allergies, family history, social history, health maintenance with the patient/caregiver today.   ROS  ***  Objective  There were no vitals filed for this visit.  There is no height or weight on file to calculate BMI.  Physical Exam ***  No results found for this or any previous visit (from the past 2160 hour(s)).   PHQ2/9:    08/18/2022    3:45 PM 08/09/2022    3:16 PM 07/26/2022    2:27 PM 05/03/2022     3:10 PM 08/25/2021    9:56 AM  Depression screen PHQ 2/9  Decreased Interest 0 0 0 0 0  Down, Depressed, Hopeless 0 0 0 0 0  PHQ - 2 Score 0 0 0 0 0  Altered sleeping  1 0 0 0  Tired, decreased energy  1 0 0 0  Change in appetite  0 0 0 0  Feeling bad or failure about yourself   0 0 0 0  Trouble concentrating  0 0 0 0  Moving slowly or fidgety/restless  0 0 0 0  Suicidal thoughts  0 0 0 0  PHQ-9 Score  2 0 0 0  Difficult doing work/chores  Not difficult at all       phq 9 is {gen pos [id number]}   Fall Risk:    08/18/2022    3:44 PM 08/09/2022    3:15 PM 07/26/2022    2:27 PM 05/03/2022    3:09 PM 08/25/2021    9:54 AM  Fall Risk   Falls in the past year? 0 0 0 0 0  Number falls in past yr: 0  0 0 0  Injury with Fall? 0  0 0 0  Risk for fall due to : No Fall Risks  No Fall Risks No Fall Risks No Fall Risks  Follow up Education provided;Falls prevention discussed Falls prevention discussed;Education provided;Falls evaluation completed Falls prevention discussed Falls prevention discussed Falls prevention discussed      Functional Status Survey:      Assessment & Plan  *** There are no diagnoses linked to this encounter.

## 2022-11-15 ENCOUNTER — Ambulatory Visit: Payer: Medicare PPO | Admitting: Family Medicine

## 2022-11-21 ENCOUNTER — Institutional Professional Consult (permissible substitution): Payer: Medicare PPO | Admitting: Pulmonary Disease

## 2022-12-07 ENCOUNTER — Other Ambulatory Visit: Payer: Self-pay | Admitting: Family Medicine

## 2022-12-07 DIAGNOSIS — R399 Unspecified symptoms and signs involving the genitourinary system: Secondary | ICD-10-CM

## 2022-12-08 ENCOUNTER — Other Ambulatory Visit: Payer: Self-pay | Admitting: Family Medicine

## 2022-12-08 DIAGNOSIS — J438 Other emphysema: Secondary | ICD-10-CM

## 2022-12-13 NOTE — Progress Notes (Signed)
Name: Stephen [name]   MRN: [id number]    DOB: 08-20-1955   Date:12/14/2022       Progress Note  Subjective  Chief Complaint  Follow Up  HPI  Emphysema:he started smoking at age 67 and smokes on average one pack daily, not ready to quit.  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 Currently taking Trelegy , he is going to see pulmonologist , he wakes up feeling tired .   Chest pain: he continues to have intermittent chest pain, he was doing better when taking statin therapy but when he stopped he had one more episode a few weeks ago when under more stress ( wife diagnosed with stage IV colon cancer)  He is due to see cardiologist but postponing due to wife's illness  Atherosclerosis of aorta: on his CT chest seen 05/19/2019 he also has coronary calcifications - he will resume taking crestor he is also taking aspirin daily   B12 deficiency: reminded him to resume B12 sublingually daily but skips sometimes   DDD lumbar spine: he had CT abdomen that showed DDD L4-S1 and large protusion L5-S1, he states symptoms are mild 1/10 the worse pain is 3/10  and not taking medications for it   Senile purpura: on both arms, reassurance given .   Stress: wife went for MRI back due to fracture of spine and found to have colon cancer stage IV, he is under stress, discussed medication but he wants to hold off for now. Explained we can start with prn Buspar   Patient Active Problem List   Diagnosis Date Noted   Intermittent left-sided chest pain 05/03/2022   B12 deficiency 08/25/2021   Hyperglycemia 08/25/2021   Lower urinary tract symptoms (LUTS) 08/25/2021   Coronary artery calcification of native artery 08/25/2021   Hematuria, microscopic 08/25/2021   Atypical pigmented lesion 08/25/2021   Senile purpura (HCC) 11/17/2020   Hepatic cyst 10/01/2020   Smoker 08/18/2020    Thoracic aorta atherosclerosis (HCC) 04/27/2020   Current every day smoker 05/18/2016   Other emphysema (HCC) 11/18/2015    No past surgical history on file.  Family History  Problem Relation Age of Onset   Hypertension Mother    Hypothyroidism Mother    Alzheimer's disease Father     Social History   Tobacco Use   Smoking status: Every Day    Current packs/day: 1.00    Average packs/day: 1 pack/day for 45.0 years (45.0 ttl pk-yrs)    Types: Cigarettes   Smokeless tobacco: Never  Substance Use Topics   Alcohol use: No    Alcohol/week: 0.0 standard drinks of alcohol     Current Outpatient Medications:    albuterol (VENTOLIN HFA) 108 (90 Base) MCG/ACT inhaler, INHALE 2 PUFFS INTO THE LUNGS EVERY 6 HOURS AS NEEDED FOR WHEEZING ORSHORTNESS OF BREATH, Disp: 18 g, Rfl: 1   aspirin EC 81 MG tablet, Take 1 tablet (81 mg total) by mouth daily. Swallow whole., Disp: 30 tablet, Rfl: 12   Fluticasone-Umeclidin-Vilant (TRELEGY ELLIPTA) 100-62.5-25 MCG/ACT AEPB, Inhale 1 puff into the lungs daily. In place of Brezti, Disp: 3 each, Rfl: 0   Methylcobalamin (B-12) 1000 MCG TBDP, PLACE 1 TABLET UNDER THE TONGUE DAILY, Disp: 150 tablet, Rfl: 3   tamsulosin (FLOMAX) 0.4 MG CAPS capsule, TAKE 1 CAPSULE BY MOUTH EVERY DAY, Disp: 30 capsule, Rfl: 0  rosuvastatin (CRESTOR) 10 MG tablet, Take 1 tablet (10 mg total) by mouth daily. (Patient not taking: Reported on 12/14/2022), Disp: 90 tablet, Rfl: 3  No Known Allergies  I personally reviewed active problem list, medication list, allergies, family history, social history, health maintenance with the patient/caregiver today.   ROS  Ten systems reviewed and is negative except as mentioned in HPI    Objective  Vitals:   12/14/22 1012  BP: 132/76  Pulse: 81  Resp: 16  SpO2: 97%  Weight: 215 lb (97.5 kg)  Height: 6' (1.829 m)    Body mass index is 29.16 kg/m.  Physical Exam  Constitutional: Patient appears well-developed and  well-nourished.  No distress.  HEENT: head atraumatic, normocephalic, pupils equal and reactive to light, neck supple Cardiovascular: Normal rate, regular rhythm and normal heart sounds.  No murmur heard. No BLE edema. Pulmonary/Chest: Effort normal and breath sounds normal. No respiratory distress. Abdominal: Soft.  There is no tenderness. Psychiatric: Patient has a normal mood and affect. behavior is normal. Judgment and thought content normal.    PHQ2/9:    12/14/2022   10:12 AM 08/18/2022    3:45 PM 08/09/2022    3:16 PM 07/26/2022    2:27 PM 05/03/2022    3:10 PM  Depression screen PHQ 2/9  Decreased Interest 0 0 0 0 0  Down, Depressed, Hopeless 0 0 0 0 0  PHQ - 2 Score 0 0 0 0 0  Altered sleeping 0  1 0 0  Tired, decreased energy 0  1 0 0  Change in appetite 0  0 0 0  Feeling bad or failure about yourself  0  0 0 0  Trouble concentrating 0  0 0 0  Moving slowly or fidgety/restless 0  0 0 0  Suicidal thoughts 0  0 0 0  PHQ-9 Score 0  2 0 0  Difficult doing work/chores   Not difficult at all      phq 9 is negative   Fall Risk:    12/14/2022   10:12 AM 08/18/2022    3:44 PM 08/09/2022    3:15 PM 07/26/2022    2:27 PM 05/03/2022    3:09 PM  Fall Risk   Falls in the past year? 0 0 0 0 0  Number falls in past yr: 0 0  0 0  Injury with Fall? 0 0  0 0  Risk for fall due to : No Fall Risks No Fall Risks  No Fall Risks No Fall Risks  Follow up Falls prevention discussed Education provided;Falls prevention discussed Falls prevention discussed;Education provided;Falls evaluation completed Falls prevention discussed Falls prevention discussed      Functional Status Survey: Is the patient deaf or have difficulty hearing?: No Does the patient have difficulty seeing, even when wearing glasses/contacts?: Yes Does the patient have difficulty concentrating, remembering, or making decisions?: No Does the patient have difficulty walking or climbing stairs?: No Does the patient have  difficulty dressing or bathing?: No Does the patient have difficulty doing errands alone such as visiting a doctor's office or shopping?: No    Assessment & Plan  1. Thoracic aorta atherosclerosis (HCC)  - rosuvastatin (CRESTOR) 10 MG tablet; Take 1 tablet (10 mg total) by mouth daily.  Dispense: 90 tablet; Refill: 3  2. Other emphysema (HCC)  - Fluticasone-Umeclidin-Vilant (TRELEGY ELLIPTA) 100-62.5-25 MCG/ACT AEPB; Inhale 1 puff into the lungs daily.  Dispense: 180 each; Refill: 1  3. Senile purpura (HCC)  Reassurance given  4. Esophageal dysphagia  He wants to hold off on seeing GI due to cost and also due to wife's terminal illness   5. Coronary artery calcification of native artery  - rosuvastatin (CRESTOR) 10 MG tablet; Take 1 tablet (10 mg total) by mouth daily.  Dispense: 90 tablet; Refill: 3  6. DDD (degenerative disc disease), lumbosacral  Stable   7. Stressful life event affecting family  Discussed hospice  8. Positive fecal occult blood test  He went to GI but decided not to have colonoscopy due to cost and now wife has cancer and he is worried about taking care of her   Explained he should move forward with his procedures  9. Lower urinary tract symptoms (LUTS)  - tamsulosin (FLOMAX) 0.4 MG CAPS capsule; Take 1 capsule (0.4 mg total) by mouth daily.  Dispense: 90 capsule; Refill: 1

## 2022-12-14 ENCOUNTER — Encounter: Payer: Self-pay | Admitting: Family Medicine

## 2022-12-14 ENCOUNTER — Ambulatory Visit (INDEPENDENT_AMBULATORY_CARE_PROVIDER_SITE_OTHER): Payer: Medicare PPO | Admitting: Family Medicine

## 2022-12-14 VITALS — BP 132/76 | HR 81 | Resp 16 | Ht 72.0 in | Wt 215.0 lb

## 2022-12-14 DIAGNOSIS — I2510 Atherosclerotic heart disease of native coronary artery without angina pectoris: Secondary | ICD-10-CM

## 2022-12-14 DIAGNOSIS — I700 Atherosclerosis of aorta: Secondary | ICD-10-CM | POA: Diagnosis not present

## 2022-12-14 DIAGNOSIS — R195 Other fecal abnormalities: Secondary | ICD-10-CM

## 2022-12-14 DIAGNOSIS — D692 Other nonthrombocytopenic purpura: Secondary | ICD-10-CM | POA: Diagnosis not present

## 2022-12-14 DIAGNOSIS — Z6379 Other stressful life events affecting family and household: Secondary | ICD-10-CM

## 2022-12-14 DIAGNOSIS — J438 Other emphysema: Secondary | ICD-10-CM

## 2022-12-14 DIAGNOSIS — R399 Unspecified symptoms and signs involving the genitourinary system: Secondary | ICD-10-CM

## 2022-12-14 DIAGNOSIS — R1319 Other dysphagia: Secondary | ICD-10-CM | POA: Diagnosis not present

## 2022-12-14 DIAGNOSIS — I2584 Coronary atherosclerosis due to calcified coronary lesion: Secondary | ICD-10-CM

## 2022-12-14 DIAGNOSIS — M5137 Other intervertebral disc degeneration, lumbosacral region: Secondary | ICD-10-CM

## 2022-12-14 MED ORDER — TAMSULOSIN HCL 0.4 MG PO CAPS
0.4000 mg | ORAL_CAPSULE | Freq: Every day | ORAL | 1 refills | Status: DC
Start: 2022-12-14 — End: 2023-10-05

## 2022-12-14 MED ORDER — TRELEGY ELLIPTA 100-62.5-25 MCG/ACT IN AEPB: 1.0000 | INHALATION_SPRAY | Freq: Every day | RESPIRATORY_TRACT | 1 refills | Status: DC

## 2022-12-14 MED ORDER — ROSUVASTATIN CALCIUM 10 MG PO TABS: 10.0000 mg | ORAL_TABLET | Freq: Every day | ORAL | 3 refills | Status: DC

## 2022-12-22 ENCOUNTER — Institutional Professional Consult (permissible substitution): Payer: Medicare PPO | Admitting: Pulmonary Disease

## 2023-01-16 ENCOUNTER — Institutional Professional Consult (permissible substitution): Payer: Medicare PPO | Admitting: Pulmonary Disease

## 2023-02-02 ENCOUNTER — Institutional Professional Consult (permissible substitution): Payer: Medicare PPO | Admitting: Pulmonary Disease

## 2023-02-07 ENCOUNTER — Institutional Professional Consult (permissible substitution): Payer: Medicare PPO | Admitting: Pulmonary Disease

## 2023-04-12 DIAGNOSIS — E1169 Type 2 diabetes mellitus with other specified complication: Secondary | ICD-10-CM | POA: Diagnosis present

## 2023-04-12 DIAGNOSIS — J449 Chronic obstructive pulmonary disease, unspecified: Secondary | ICD-10-CM | POA: Diagnosis not present

## 2023-04-12 DIAGNOSIS — E538 Deficiency of other specified B group vitamins: Secondary | ICD-10-CM | POA: Diagnosis not present

## 2023-04-12 DIAGNOSIS — Z1331 Encounter for screening for depression: Secondary | ICD-10-CM | POA: Diagnosis not present

## 2023-04-12 DIAGNOSIS — Z125 Encounter for screening for malignant neoplasm of prostate: Secondary | ICD-10-CM | POA: Diagnosis not present

## 2023-04-12 DIAGNOSIS — Z0000 Encounter for general adult medical examination without abnormal findings: Secondary | ICD-10-CM | POA: Diagnosis not present

## 2023-04-12 DIAGNOSIS — E119 Type 2 diabetes mellitus without complications: Secondary | ICD-10-CM | POA: Diagnosis not present

## 2023-04-12 DIAGNOSIS — E782 Mixed hyperlipidemia: Secondary | ICD-10-CM | POA: Diagnosis not present

## 2023-04-12 DIAGNOSIS — E785 Hyperlipidemia, unspecified: Secondary | ICD-10-CM | POA: Diagnosis not present

## 2023-04-20 DIAGNOSIS — J441 Chronic obstructive pulmonary disease with (acute) exacerbation: Secondary | ICD-10-CM | POA: Diagnosis not present

## 2023-04-20 DIAGNOSIS — E119 Type 2 diabetes mellitus without complications: Secondary | ICD-10-CM | POA: Diagnosis not present

## 2023-04-25 DIAGNOSIS — J449 Chronic obstructive pulmonary disease, unspecified: Secondary | ICD-10-CM | POA: Diagnosis not present

## 2023-04-25 DIAGNOSIS — R0609 Other forms of dyspnea: Secondary | ICD-10-CM | POA: Diagnosis not present

## 2023-04-25 DIAGNOSIS — F17218 Nicotine dependence, cigarettes, with other nicotine-induced disorders: Secondary | ICD-10-CM | POA: Diagnosis not present

## 2023-05-16 DIAGNOSIS — F32A Depression, unspecified: Secondary | ICD-10-CM | POA: Diagnosis not present

## 2023-05-16 DIAGNOSIS — J449 Chronic obstructive pulmonary disease, unspecified: Secondary | ICD-10-CM | POA: Diagnosis not present

## 2023-05-16 DIAGNOSIS — R319 Hematuria, unspecified: Secondary | ICD-10-CM | POA: Diagnosis not present

## 2023-06-13 ENCOUNTER — Ambulatory Visit: Payer: Self-pay | Admitting: Family Medicine

## 2023-06-15 ENCOUNTER — Other Ambulatory Visit
Admission: RE | Admit: 2023-06-15 | Discharge: 2023-06-15 | Disposition: A | Discharge status: Home / Self Care | Source: Ambulatory Visit | Attending: Specialist | Admitting: Specialist

## 2023-06-15 DIAGNOSIS — R0602 Shortness of breath: Secondary | ICD-10-CM | POA: Insufficient documentation

## 2023-06-15 LAB — D-DIMER, QUANTITATIVE: D-Dimer, Quant: <0.27 ug{FEU}/mL (ref 0.00–0.50)

## 2023-06-19 ENCOUNTER — Emergency Department

## 2023-06-19 ENCOUNTER — Other Ambulatory Visit: Payer: Self-pay

## 2023-06-19 ENCOUNTER — Emergency Department
Admission: EM | Admit: 2023-06-19 | Discharge: 2023-06-19 | Disposition: A | Discharge status: Home / Self Care | Attending: Emergency Medicine | Admitting: Emergency Medicine

## 2023-06-19 ENCOUNTER — Encounter: Payer: Self-pay | Admitting: Emergency Medicine

## 2023-06-19 DIAGNOSIS — J441 Chronic obstructive pulmonary disease with (acute) exacerbation: Secondary | ICD-10-CM | POA: Diagnosis not present

## 2023-06-19 DIAGNOSIS — I2510 Atherosclerotic heart disease of native coronary artery without angina pectoris: Secondary | ICD-10-CM | POA: Diagnosis not present

## 2023-06-19 DIAGNOSIS — R0602 Shortness of breath: Secondary | ICD-10-CM | POA: Diagnosis present

## 2023-06-19 DIAGNOSIS — R0600 Dyspnea, unspecified: Secondary | ICD-10-CM

## 2023-06-19 LAB — CBC WITH DIFFERENTIAL/PLATELET
Abs Immature Granulocytes: 0.04 10*3/uL (ref 0.00–0.07)
Basophils Absolute: 0 10*3/uL (ref 0.0–0.1)
Basophils Relative: 0 %
Eosinophils Absolute: 0.2 10*3/uL (ref 0.0–0.5)
Eosinophils Relative: 2 %
HCT: 46.7 % (ref 39.0–52.0)
Hemoglobin: 15.6 g/dL (ref 13.0–17.0)
Immature Granulocytes: 1 %
Lymphocytes Relative: 9 %
Lymphs Abs: 0.7 10*3/uL (ref 0.7–4.0)
MCH: 32.4 pg (ref 26.0–34.0)
MCHC: 33.4 g/dL (ref 30.0–36.0)
MCV: 96.9 fL (ref 80.0–100.0)
Monocytes Absolute: 0.7 10*3/uL (ref 0.1–1.0)
Monocytes Relative: 8 %
Neutro Abs: 6.8 10*3/uL (ref 1.7–7.7)
Neutrophils Relative %: 80 %
Platelets: 203 10*3/uL (ref 150–400)
RBC: 4.82 MIL/uL (ref 4.22–5.81)
RDW: 13.2 % (ref 11.5–15.5)
WBC: 8.4 10*3/uL (ref 4.0–10.5)
nRBC: 0 % (ref 0.0–0.2)

## 2023-06-19 LAB — COMPREHENSIVE METABOLIC PANEL
ALT: 15 U/L (ref 0–44)
AST: 13 U/L — ABNORMAL LOW (ref 15–41)
Albumin: 3.2 g/dL — ABNORMAL LOW (ref 3.5–5.0)
Alkaline Phosphatase: 49 U/L (ref 38–126)
Anion gap: 11 (ref 5–15)
BUN: 12 mg/dL (ref 8–23)
CO2: 27 mmol/L (ref 22–32)
Calcium: 8.9 mg/dL (ref 8.9–10.3)
Chloride: 99 mmol/L (ref 98–111)
Creatinine, Ser: 0.77 mg/dL (ref 0.61–1.24)
GFR, Estimated: >60 mL/min (ref >60)
Glucose, Bld: 162 mg/dL — ABNORMAL HIGH (ref 70–99)
Potassium: 4.6 mmol/L (ref 3.5–5.1)
Sodium: 137 mmol/L (ref 135–145)
Total Bilirubin: 1 mg/dL (ref 0.0–1.2)
Total Protein: 6.5 g/dL (ref 6.5–8.1)

## 2023-06-19 LAB — LACTIC ACID, PLASMA: Lactic Acid, Venous: 1.5 mmol/L (ref 0.5–1.9)

## 2023-06-19 LAB — TROPONIN I (HIGH SENSITIVITY): Troponin I (High Sensitivity): 7 ng/L (ref <18)

## 2023-06-19 MED ORDER — METHYLPREDNISOLONE SODIUM SUCC 125 MG IJ SOLR
125.0000 mg | Freq: Once | INTRAMUSCULAR | Status: AC
  Administered 2023-06-19: 125 mg via INTRAVENOUS
  Filled 2023-06-19: qty 2

## 2023-06-19 MED ORDER — IPRATROPIUM-ALBUTEROL 0.5-2.5 (3) MG/3ML IN SOLN
3.0000 mL | Freq: Once | RESPIRATORY_TRACT | Status: AC
  Administered 2023-06-19: 3 mL via RESPIRATORY_TRACT

## 2023-06-19 MED ORDER — IPRATROPIUM-ALBUTEROL 0.5-2.5 (3) MG/3ML IN SOLN
RESPIRATORY_TRACT | Status: AC
  Filled 2023-06-19: qty 6

## 2023-06-19 MED ORDER — IOHEXOL 350 MG/ML SOLN
75.0000 mL | Freq: Once | INTRAVENOUS | Status: AC | PRN
Start: 2023-06-19 — End: 2023-06-19
  Administered 2023-06-19: 75 mL via INTRAVENOUS

## 2023-06-19 MED ORDER — IPRATROPIUM-ALBUTEROL 0.5-2.5 (3) MG/3ML IN SOLN
3.0000 mL | Freq: Once | RESPIRATORY_TRACT | Status: AC
  Administered 2023-06-19: 3 mL via RESPIRATORY_TRACT
  Filled 2023-06-19: qty 3

## 2023-06-19 MED ORDER — PREDNISONE 10 MG PO TABS: 10.0000 mg | ORAL_TABLET | Freq: Every day | ORAL | 0 refills | Status: DC

## 2023-06-19 NOTE — ED Provider Notes (Signed)
 [facility] Provider Note    Event Date/Time   First MD Initiated Contact with Patient 06/19/23 1301     (approximate)  History   Chief Complaint: Shortness of Breath  HPI  Carlous Olivares is a 68 y.o. male with a past medical history of COPD, anxiety, CAD, presents to the emergency department for worsening shortness of breath.  According to the patient for the past 4 weeks he has been coughing and has been experiencing worsening shortness of breath.  Patient underwent a course of antibiotics 3 weeks ago without any change and finished a course of prednisone again without any change.  Given the patient's continued shortness of breath he came to the emergency department for evaluation.  Patient currently satting 95% on room air.  Patient denies any fever.  Does state cough but denies any vomiting or diarrhea.  No chest pain.  Physical Exam   Triage Vital Signs: ED Triage Vitals  Encounter Vitals Group     BP 06/19/23 1128 (!) 145/86     Systolic BP Percentile --      Diastolic BP Percentile --      Pulse Rate 06/19/23 1128 90     Resp 06/19/23 1128 20     Temp 06/19/23 1128 98.2 F (36.8 C)     Temp Source 06/19/23 1128 Oral     SpO2 06/19/23 1123 95 %     Weight 06/19/23 1125 210 lb (95.3 kg)     Height 06/19/23 1125 6' (1.829 m)     Head Circumference --      Peak Flow --      Pain Score 06/19/23 1125 0     Pain Loc --      Pain Education --      Exclude from Growth Chart --     Most recent vital signs: Vitals:   06/19/23 1123 06/19/23 1128  BP:  (!) 145/86  Pulse:  90  Resp:  20  Temp:  98.2 F (36.8 C)  SpO2: 95% 95%    General: Awake, no distress.  CV:  Good peripheral perfusion.  Regular rate and rhythm  Resp:  Mild tachypnea around 20 to 25 breaths/min.  Mild expiratory wheeze bilaterally without rales or rhonchi. Abd:  No distention.  Soft, nontender.   ED Results / Procedures / Treatments   EKG  EKG viewed and interpreted by  myself shows what appears to be a sinus tachycardia at 106 bpm with a narrow QRS, normal axis, normal intervals, nonspecific ST changes.  No ST elevation.  RADIOLOGY  I have reviewed interpret the chest x-ray images.  On my evaluation patient does have some slight haziness to the lateral aspects of both lungs but no obvious consolidation.  MEDICATIONS ORDERED IN ED: Medications  methylPREDNISolone sodium succinate (SOLU-MEDROL) 125 mg/2 mL injection 125 mg (has no administration in time range)  ipratropium-albuterol (DUONEB) 0.5-2.5 (3) MG/3ML nebulizer solution 3 mL (has no administration in time range)  ipratropium-albuterol (DUONEB) 0.5-2.5 (3) MG/3ML nebulizer solution 3 mL (has no administration in time range)  ipratropium-albuterol (DUONEB) 0.5-2.5 (3) MG/3ML nebulizer solution 3 mL (3 mLs Nebulization Given 06/19/23 1147)  ipratropium-albuterol (DUONEB) 0.5-2.5 (3) MG/3ML nebulizer solution 3 mL (3 mLs Nebulization Given 06/19/23 1147)  ipratropium-albuterol (DUONEB) 0.5-2.5 (3) MG/3ML nebulizer solution (  Given by Other 06/19/23 1257)     IMPRESSION / MDM / ASSESSMENT AND PLAN / ED COURSE  I reviewed the triage vital signs and the nursing notes.  Patient's presentation is most consistent with acute presentation with potential threat to life or bodily function.  Patient presents to the emergency department for worsening shortness of breath and cough ongoing x 4 weeks.  Patient's lab work is reassuring with a normal CBC, normal chemistry, negative troponin, normal lactic acid.  Chest x-ray does not appear to show any significant abnormality.  However given the patient's worsening shortness of breath and continued cough x 4 weeks we will proceed with a CTA of the chest to rule out pulmonary embolism or occult pneumonia.  Will dose IV Solu-Medrol and DuoNebs while waiting on results.  Patient agreeable to plan of care.  Patient follows up with pulmonology, Dr. Meredeth Ide but he is out of town  this week per patient.  I have reviewed the patient's CT images.  No significant abnormality seen on my evaluation.  CTA read is pending.  Patient care signed out to oncoming provider.  FINAL CLINICAL IMPRESSION(S) / ED DIAGNOSES   Dyspnea COPD exacerbation   Note:  This document was prepared using Dragon voice recognition software and may include unintentional dictation errors.   Minna Antis, MD 06/19/23 1544

## 2023-06-19 NOTE — ED Notes (Signed)
 Patient transported to CT

## 2023-06-19 NOTE — Discharge Instructions (Signed)
 Please take your steroids as prescribed for their entire taper.  Please take your breathing treatments as prescribed by your pulmonologist.  Please follow-up with your pulmonologist as soon as you are able.  Return to the emergency department for any chest pain, any significant worsening of breathing, or any other symptom concerning to yourself.

## 2023-06-19 NOTE — ED Triage Notes (Signed)
 Pt in via POV, reports ongoing bronchitis x 4 weeks, being treated w/ anitibiotics and steroid therapy w/out any relief.  Dyspnea is worse on exertion, audible wheezing noted in triage.  Hx of COPD.

## 2023-07-04 DIAGNOSIS — R0609 Other forms of dyspnea: Secondary | ICD-10-CM | POA: Diagnosis not present

## 2023-07-11 ENCOUNTER — Encounter: Payer: Self-pay | Admitting: Urology

## 2023-07-11 ENCOUNTER — Ambulatory Visit: Payer: Medicare PPO | Admitting: Urology

## 2023-07-13 DIAGNOSIS — I350 Nonrheumatic aortic (valve) stenosis: Secondary | ICD-10-CM | POA: Diagnosis not present

## 2023-07-13 DIAGNOSIS — J449 Chronic obstructive pulmonary disease, unspecified: Secondary | ICD-10-CM | POA: Diagnosis not present

## 2023-08-15 ENCOUNTER — Encounter: Payer: Self-pay | Admitting: Family Medicine

## 2023-08-29 DIAGNOSIS — I350 Nonrheumatic aortic (valve) stenosis: Secondary | ICD-10-CM | POA: Diagnosis not present

## 2023-08-29 DIAGNOSIS — R0609 Other forms of dyspnea: Secondary | ICD-10-CM | POA: Diagnosis not present

## 2023-08-29 DIAGNOSIS — J449 Chronic obstructive pulmonary disease, unspecified: Secondary | ICD-10-CM | POA: Diagnosis not present

## 2023-08-29 DIAGNOSIS — F17210 Nicotine dependence, cigarettes, uncomplicated: Secondary | ICD-10-CM | POA: Diagnosis not present

## 2023-08-29 DIAGNOSIS — R634 Abnormal weight loss: Secondary | ICD-10-CM | POA: Diagnosis not present

## 2023-10-01 ENCOUNTER — Emergency Department

## 2023-10-01 ENCOUNTER — Other Ambulatory Visit: Payer: Self-pay

## 2023-10-01 ENCOUNTER — Inpatient Hospital Stay
Admission: EM | Admit: 2023-10-01 | Discharge: 2023-10-05 | DRG: 208 | Disposition: A | Discharge status: Home / Self Care | Attending: Obstetrics and Gynecology | Admitting: Obstetrics and Gynecology

## 2023-10-01 DIAGNOSIS — Z79899 Other long term (current) drug therapy: Secondary | ICD-10-CM

## 2023-10-01 DIAGNOSIS — Z820 Family history of epilepsy and other diseases of the nervous system: Secondary | ICD-10-CM

## 2023-10-01 DIAGNOSIS — F17210 Nicotine dependence, cigarettes, uncomplicated: Secondary | ICD-10-CM | POA: Diagnosis present

## 2023-10-01 DIAGNOSIS — Z8249 Family history of ischemic heart disease and other diseases of the circulatory system: Secondary | ICD-10-CM

## 2023-10-01 DIAGNOSIS — I10 Essential (primary) hypertension: Secondary | ICD-10-CM | POA: Diagnosis not present

## 2023-10-01 DIAGNOSIS — R338 Other retention of urine: Secondary | ICD-10-CM | POA: Diagnosis present

## 2023-10-01 DIAGNOSIS — F419 Anxiety disorder, unspecified: Secondary | ICD-10-CM | POA: Diagnosis present

## 2023-10-01 DIAGNOSIS — R0902 Hypoxemia: Secondary | ICD-10-CM | POA: Diagnosis not present

## 2023-10-01 DIAGNOSIS — Z7982 Long term (current) use of aspirin: Secondary | ICD-10-CM | POA: Diagnosis not present

## 2023-10-01 DIAGNOSIS — G928 Other toxic encephalopathy: Secondary | ICD-10-CM | POA: Diagnosis not present

## 2023-10-01 DIAGNOSIS — I2489 Other forms of acute ischemic heart disease: Secondary | ICD-10-CM | POA: Diagnosis not present

## 2023-10-01 DIAGNOSIS — I2584 Coronary atherosclerosis due to calcified coronary lesion: Secondary | ICD-10-CM | POA: Diagnosis not present

## 2023-10-01 DIAGNOSIS — Z1152 Encounter for screening for COVID-19: Secondary | ICD-10-CM

## 2023-10-01 DIAGNOSIS — J9622 Acute and chronic respiratory failure with hypercapnia: Secondary | ICD-10-CM | POA: Diagnosis present

## 2023-10-01 DIAGNOSIS — J439 Emphysema, unspecified: Secondary | ICD-10-CM | POA: Diagnosis present

## 2023-10-01 DIAGNOSIS — R579 Shock, unspecified: Secondary | ICD-10-CM | POA: Diagnosis not present

## 2023-10-01 DIAGNOSIS — Z7952 Long term (current) use of systemic steroids: Secondary | ICD-10-CM | POA: Diagnosis not present

## 2023-10-01 DIAGNOSIS — Z716 Tobacco abuse counseling: Secondary | ICD-10-CM

## 2023-10-01 DIAGNOSIS — T4275XA Adverse effect of unspecified antiepileptic and sedative-hypnotic drugs, initial encounter: Secondary | ICD-10-CM | POA: Diagnosis not present

## 2023-10-01 DIAGNOSIS — J441 Chronic obstructive pulmonary disease with (acute) exacerbation: Secondary | ICD-10-CM | POA: Diagnosis not present

## 2023-10-01 DIAGNOSIS — I952 Hypotension due to drugs: Secondary | ICD-10-CM | POA: Diagnosis not present

## 2023-10-01 DIAGNOSIS — J9602 Acute respiratory failure with hypercapnia: Secondary | ICD-10-CM

## 2023-10-01 DIAGNOSIS — K7689 Other specified diseases of liver: Secondary | ICD-10-CM | POA: Diagnosis not present

## 2023-10-01 DIAGNOSIS — N401 Enlarged prostate with lower urinary tract symptoms: Secondary | ICD-10-CM | POA: Diagnosis present

## 2023-10-01 DIAGNOSIS — Z4682 Encounter for fitting and adjustment of non-vascular catheter: Secondary | ICD-10-CM | POA: Diagnosis not present

## 2023-10-01 DIAGNOSIS — R399 Unspecified symptoms and signs involving the genitourinary system: Secondary | ICD-10-CM

## 2023-10-01 DIAGNOSIS — E872 Acidosis, unspecified: Secondary | ICD-10-CM | POA: Diagnosis present

## 2023-10-01 DIAGNOSIS — R531 Weakness: Secondary | ICD-10-CM | POA: Diagnosis not present

## 2023-10-01 DIAGNOSIS — F17200 Nicotine dependence, unspecified, uncomplicated: Secondary | ICD-10-CM | POA: Diagnosis present

## 2023-10-01 DIAGNOSIS — R918 Other nonspecific abnormal finding of lung field: Secondary | ICD-10-CM | POA: Diagnosis not present

## 2023-10-01 DIAGNOSIS — E871 Hypo-osmolality and hyponatremia: Secondary | ICD-10-CM | POA: Diagnosis not present

## 2023-10-01 DIAGNOSIS — I700 Atherosclerosis of aorta: Secondary | ICD-10-CM | POA: Diagnosis present

## 2023-10-01 DIAGNOSIS — I2510 Atherosclerotic heart disease of native coronary artery without angina pectoris: Secondary | ICD-10-CM | POA: Diagnosis not present

## 2023-10-01 DIAGNOSIS — R4182 Altered mental status, unspecified: Secondary | ICD-10-CM | POA: Diagnosis not present

## 2023-10-01 DIAGNOSIS — J9621 Acute and chronic respiratory failure with hypoxia: Secondary | ICD-10-CM | POA: Diagnosis not present

## 2023-10-01 DIAGNOSIS — J449 Chronic obstructive pulmonary disease, unspecified: Secondary | ICD-10-CM | POA: Insufficient documentation

## 2023-10-01 LAB — MRSA NEXT GEN BY PCR, NASAL: MRSA by PCR Next Gen: NOT DETECTED

## 2023-10-01 LAB — BLOOD GAS, VENOUS
Acid-Base Excess: 6.4 mmol/L — ABNORMAL HIGH (ref 0.0–2.0)
Acid-Base Excess: 5.2 mmol/L — ABNORMAL HIGH (ref 0.0–2.0)
Bicarbonate: 39.2 mmol/L — ABNORMAL HIGH (ref 20.0–28.0)
Bicarbonate: 38.6 mmol/L — ABNORMAL HIGH (ref 20.0–28.0)
O2 Saturation: 87.6 %
O2 Saturation: 97.4 %
Patient temperature: 37
Patient temperature: 37
pCO2, Ven: 110 mmHg (ref 44–60)
pCO2, Ven: 116 mmHg (ref 44–60)
pH, Ven: 7.16 — CL (ref 7.25–7.43)
pH, Ven: 7.13 — CL (ref 7.25–7.43)
pO2, Ven: 62 mmHg — ABNORMAL HIGH (ref 32–45)
pO2, Ven: 91 mmHg — ABNORMAL HIGH (ref 32–45)

## 2023-10-01 LAB — CBC WITH DIFFERENTIAL/PLATELET
Abs Immature Granulocytes: 0.03 10*3/uL (ref 0.00–0.07)
Basophils Absolute: 0.1 10*3/uL (ref 0.0–0.1)
Basophils Relative: 1 %
Eosinophils Absolute: 0 10*3/uL (ref 0.0–0.5)
Eosinophils Relative: 0 %
HCT: 50.1 % (ref 39.0–52.0)
Hemoglobin: 15.6 g/dL (ref 13.0–17.0)
Immature Granulocytes: 0 %
Lymphocytes Relative: 13 %
Lymphs Abs: 1.3 10*3/uL (ref 0.7–4.0)
MCH: 31.1 pg (ref 26.0–34.0)
MCHC: 31.1 g/dL (ref 30.0–36.0)
MCV: 100 fL (ref 80.0–100.0)
Monocytes Absolute: 0.7 10*3/uL (ref 0.1–1.0)
Monocytes Relative: 7 %
Neutro Abs: 7.8 10*3/uL — ABNORMAL HIGH (ref 1.7–7.7)
Neutrophils Relative %: 79 %
Platelets: 275 10*3/uL (ref 150–400)
RBC: 5.01 MIL/uL (ref 4.22–5.81)
RDW: 12.8 % (ref 11.5–15.5)
WBC: 9.9 10*3/uL (ref 4.0–10.5)
nRBC: 0 % (ref 0.0–0.2)

## 2023-10-01 LAB — GLUCOSE, CAPILLARY
Glucose-Capillary: 162 mg/dL — ABNORMAL HIGH (ref 70–99)
Glucose-Capillary: 175 mg/dL — ABNORMAL HIGH (ref 70–99)

## 2023-10-01 LAB — LACTIC ACID, PLASMA: Lactic Acid, Venous: 0.7 mmol/L (ref 0.5–1.9)

## 2023-10-01 LAB — BLOOD GAS, ARTERIAL
Acid-Base Excess: 7.4 mmol/L — ABNORMAL HIGH (ref 0.0–2.0)
Bicarbonate: 35 mmol/L — ABNORMAL HIGH (ref 20.0–28.0)
FIO2: 35 %
MECHVT: 500 mL
Mechanical Rate: 18
Mechanical Rate: 20
O2 Saturation: 98.1 %
PEEP: 5 cmH2O
Patient temperature: 37
pCO2 arterial: 62 mmHg — ABNORMAL HIGH (ref 32–48)
pH, Arterial: 7.36 (ref 7.35–7.45)
pO2, Arterial: 87 mmHg (ref 83–108)

## 2023-10-01 LAB — RESPIRATORY PANEL BY PCR

## 2023-10-01 LAB — COMPREHENSIVE METABOLIC PANEL WITH GFR
ALT: 9 U/L (ref 0–44)
AST: 14 U/L — ABNORMAL LOW (ref 15–41)
Albumin: 3.8 g/dL (ref 3.5–5.0)
Alkaline Phosphatase: 66 U/L (ref 38–126)
Anion gap: 8 (ref 5–15)
BUN: 13 mg/dL (ref 8–23)
CO2: 35 mmol/L — ABNORMAL HIGH (ref 22–32)
Calcium: 9.1 mg/dL (ref 8.9–10.3)
Chloride: 92 mmol/L — ABNORMAL LOW (ref 98–111)
Creatinine, Ser: 0.63 mg/dL (ref 0.61–1.24)
GFR, Estimated: >60 mL/min (ref >60)
Glucose, Bld: 133 mg/dL — ABNORMAL HIGH (ref 70–99)
Potassium: 4.7 mmol/L (ref 3.5–5.1)
Sodium: 135 mmol/L (ref 135–145)
Total Bilirubin: 1 mg/dL (ref 0.0–1.2)
Total Protein: 7 g/dL (ref 6.5–8.1)

## 2023-10-01 LAB — URINE DRUG SCREEN, QUALITATIVE (ARMC ONLY)
Amphetamines, Ur Screen: NOT DETECTED
Barbiturates, Ur Screen: NOT DETECTED
Benzodiazepine, Ur Scrn: NOT DETECTED
Cannabinoid 50 Ng, Ur ~~LOC~~: POSITIVE — AB
Cocaine Metabolite,Ur ~~LOC~~: NOT DETECTED
MDMA (Ecstasy)Ur Screen: NOT DETECTED
Methadone Scn, Ur: NOT DETECTED
Opiate, Ur Screen: NOT DETECTED
Phencyclidine (PCP) Ur S: NOT DETECTED
Tricyclic, Ur Screen: NOT DETECTED

## 2023-10-01 LAB — RESP PANEL BY RT-PCR (RSV, FLU A&B, COVID)  RVPGX2
Influenza A by PCR: NEGATIVE
Influenza B by PCR: NEGATIVE
Resp Syncytial Virus by PCR: NEGATIVE
SARS Coronavirus 2 by RT PCR: NEGATIVE

## 2023-10-01 LAB — PROTIME-INR
INR: 0.9 (ref 0.8–1.2)
Prothrombin Time: 12.9 s (ref 11.4–15.2)

## 2023-10-01 LAB — BRAIN NATRIURETIC PEPTIDE: B Natriuretic Peptide: 77.2 pg/mL (ref 0.0–100.0)

## 2023-10-01 LAB — TROPONIN I (HIGH SENSITIVITY)
Troponin I (High Sensitivity): 34 ng/L — ABNORMAL HIGH (ref <18)
Troponin I (High Sensitivity): 49 ng/L — ABNORMAL HIGH (ref <18)
Troponin I (High Sensitivity): 38 ng/L — ABNORMAL HIGH (ref <18)

## 2023-10-01 LAB — HIV ANTIBODY (ROUTINE TESTING W REFLEX): HIV Screen 4th Generation wRfx: NONREACTIVE

## 2023-10-01 LAB — APTT: aPTT: 30 s (ref 24–36)

## 2023-10-01 LAB — ETHANOL: Alcohol, Ethyl (B): <15 mg/dL (ref <15)

## 2023-10-01 MED ORDER — NOREPINEPHRINE 4 MG/250ML-% IV SOLN
0.0000 ug/min | INTRAVENOUS | Status: DC
  Administered 2023-10-01 (×2): 5 ug/min via INTRAVENOUS
  Filled 2023-10-01 (×2): qty 250

## 2023-10-01 MED ORDER — INSULIN ASPART 100 UNIT/ML IJ SOLN
1.0000 [IU] | INTRAMUSCULAR | Status: DC
  Administered 2023-10-01 (×2): 2 [IU] via SUBCUTANEOUS
  Administered 2023-10-02: 1 [IU] via SUBCUTANEOUS
  Administered 2023-10-02: 2 [IU] via SUBCUTANEOUS
  Administered 2023-10-02: 1 [IU] via SUBCUTANEOUS
  Administered 2023-10-02: 2 [IU] via SUBCUTANEOUS
  Filled 2023-10-01: qty 1
  Filled 2023-10-01 (×4): qty 2

## 2023-10-01 MED ORDER — METRONIDAZOLE 500 MG/100ML IV SOLN
500.0000 mg | Freq: Once | INTRAVENOUS | Status: AC
  Administered 2023-10-01: 500 mg via INTRAVENOUS
  Filled 2023-10-01: qty 100

## 2023-10-01 MED ORDER — DOCUSATE SODIUM 100 MG PO CAPS: 100.0000 mg | ORAL_CAPSULE | Freq: Two times a day (BID) | ORAL | Status: DC | PRN

## 2023-10-01 MED ORDER — ETOMIDATE 2 MG/ML IV SOLN: 30.0000 mg | Freq: Once | INTRAVENOUS | Status: AC

## 2023-10-01 MED ORDER — PROPOFOL 1000 MG/100ML IV EMUL
INTRAVENOUS | Status: AC
  Administered 2023-10-01: 5 ug/kg/min via INTRAVENOUS
  Filled 2023-10-01: qty 100

## 2023-10-01 MED ORDER — FENTANYL CITRATE PF 50 MCG/ML IJ SOSY
50.0000 ug | PREFILLED_SYRINGE | INTRAMUSCULAR | Status: DC | PRN
  Administered 2023-10-01: 50 ug via INTRAVENOUS
  Filled 2023-10-01: qty 1

## 2023-10-01 MED ORDER — POLYETHYLENE GLYCOL 3350 17 G PO PACK: 17.0000 g | PACK | Freq: Every day | ORAL | Status: DC | PRN

## 2023-10-01 MED ORDER — DOCUSATE SODIUM 50 MG/5ML PO LIQD
100.0000 mg | Freq: Two times a day (BID) | ORAL | Status: DC
  Administered 2023-10-01 – 2023-10-02 (×2): 100 mg
  Filled 2023-10-01 (×2): qty 10

## 2023-10-01 MED ORDER — METHYLPREDNISOLONE SODIUM SUCC 40 MG IJ SOLR
40.0000 mg | Freq: Two times a day (BID) | INTRAMUSCULAR | Status: DC
  Administered 2023-10-01: 40 mg via INTRAVENOUS
  Filled 2023-10-01: qty 1

## 2023-10-01 MED ORDER — SODIUM CHLORIDE 0.9 % IV SOLN
2.0000 g | Freq: Once | INTRAVENOUS | Status: AC
  Administered 2023-10-01: 2 g via INTRAVENOUS
  Filled 2023-10-01: qty 12.5

## 2023-10-01 MED ORDER — PANTOPRAZOLE SODIUM 40 MG IV SOLR
40.0000 mg | Freq: Every day | INTRAVENOUS | Status: DC
  Administered 2023-10-01 – 2023-10-04 (×3): 40 mg via INTRAVENOUS
  Filled 2023-10-01 (×4): qty 10

## 2023-10-01 MED ORDER — PROPOFOL 1000 MG/100ML IV EMUL
0.0000 ug/kg/min | INTRAVENOUS | Status: DC
  Administered 2023-10-01: 35 ug/kg/min via INTRAVENOUS
  Administered 2023-10-01: 30 ug/kg/min via INTRAVENOUS
  Administered 2023-10-02 (×2): 35 ug/kg/min via INTRAVENOUS
  Filled 2023-10-01 (×4): qty 100

## 2023-10-01 MED ORDER — SODIUM CHLORIDE 0.9 % IV SOLN
2.0000 g | Freq: Every day | INTRAVENOUS | Status: DC
  Administered 2023-10-01 – 2023-10-03 (×3): 2 g via INTRAVENOUS
  Filled 2023-10-01 (×3): qty 20

## 2023-10-01 MED ORDER — ROCURONIUM BROMIDE 10 MG/ML (PF) SYRINGE
PREFILLED_SYRINGE | INTRAVENOUS | Status: AC
  Administered 2023-10-01: 100 mg via INTRAVENOUS
  Filled 2023-10-01: qty 10

## 2023-10-01 MED ORDER — SODIUM CHLORIDE 0.9 % IV SOLN
500.0000 mg | Freq: Every day | INTRAVENOUS | Status: AC
  Administered 2023-10-01 – 2023-10-03 (×3): 500 mg via INTRAVENOUS
  Filled 2023-10-01 (×3): qty 5

## 2023-10-01 MED ORDER — SUCCINYLCHOLINE CHLORIDE 200 MG/10ML IV SOSY
PREFILLED_SYRINGE | INTRAVENOUS | Status: AC
  Filled 2023-10-01: qty 10

## 2023-10-01 MED ORDER — FENTANYL CITRATE PF 50 MCG/ML IJ SOSY: 50.0000 ug | PREFILLED_SYRINGE | INTRAMUSCULAR | Status: DC | PRN

## 2023-10-01 MED ORDER — METHYLPREDNISOLONE SODIUM SUCC 125 MG IJ SOLR
125.0000 mg | Freq: Once | INTRAMUSCULAR | Status: AC
  Administered 2023-10-01: 125 mg via INTRAVENOUS
  Filled 2023-10-01: qty 2

## 2023-10-01 MED ORDER — POLYETHYLENE GLYCOL 3350 17 G PO PACK: 17.0000 g | PACK | Freq: Every day | ORAL | Status: DC

## 2023-10-01 MED ORDER — IPRATROPIUM-ALBUTEROL 0.5-2.5 (3) MG/3ML IN SOLN
9.0000 mL | Freq: Once | RESPIRATORY_TRACT | Status: AC
  Administered 2023-10-01: 9 mL via RESPIRATORY_TRACT
  Filled 2023-10-01: qty 3

## 2023-10-01 MED ORDER — VANCOMYCIN HCL IN DEXTROSE 1-5 GM/200ML-% IV SOLN
1000.0000 mg | Freq: Once | INTRAVENOUS | Status: AC
  Administered 2023-10-01: 1000 mg via INTRAVENOUS
  Filled 2023-10-01: qty 200

## 2023-10-01 MED ORDER — IPRATROPIUM-ALBUTEROL 0.5-2.5 (3) MG/3ML IN SOLN
3.0000 mL | Freq: Four times a day (QID) | RESPIRATORY_TRACT | Status: DC
  Administered 2023-10-01 – 2023-10-03 (×10): 3 mL via RESPIRATORY_TRACT
  Filled 2023-10-01 (×10): qty 3

## 2023-10-01 MED ORDER — ORAL CARE MOUTH RINSE
15.0000 mL | OROMUCOSAL | Status: DC
  Administered 2023-10-01 – 2023-10-02 (×12): 15 mL via OROMUCOSAL

## 2023-10-01 MED ORDER — ETOMIDATE 2 MG/ML IV SOLN
INTRAVENOUS | Status: AC
  Administered 2023-10-01: 30 mg via INTRAVENOUS
  Filled 2023-10-01: qty 10

## 2023-10-01 MED ORDER — SODIUM CHLORIDE 0.9 % IV SOLN
250.0000 mL | INTRAVENOUS | Status: AC
  Administered 2023-10-01: 250 mL via INTRAVENOUS

## 2023-10-01 MED ORDER — CHLORHEXIDINE GLUCONATE CLOTH 2 % EX PADS: 6.0000 | MEDICATED_PAD | Freq: Every day | CUTANEOUS | Status: DC

## 2023-10-01 MED ORDER — ORAL CARE MOUTH RINSE
15.0000 mL | OROMUCOSAL | Status: DC | PRN
Start: 2023-10-01 — End: 2023-10-03

## 2023-10-01 MED ORDER — IOHEXOL 350 MG/ML SOLN
75.0000 mL | Freq: Once | INTRAVENOUS | Status: AC | PRN
  Administered 2023-10-01: 75 mL via INTRAVENOUS

## 2023-10-01 MED ORDER — ETOMIDATE 2 MG/ML IV SOLN
INTRAVENOUS | Status: AC
  Filled 2023-10-01: qty 10

## 2023-10-01 MED ORDER — ROCURONIUM BROMIDE 10 MG/ML (PF) SYRINGE: 100.0000 mg | PREFILLED_SYRINGE | Freq: Once | INTRAVENOUS | Status: AC

## 2023-10-01 MED ORDER — ENOXAPARIN SODIUM 40 MG/0.4ML IJ SOSY
40.0000 mg | PREFILLED_SYRINGE | INTRAMUSCULAR | Status: DC
  Administered 2023-10-01 – 2023-10-04 (×3): 40 mg via SUBCUTANEOUS
  Filled 2023-10-01 (×4): qty 0.4

## 2023-10-01 NOTE — ED Triage Notes (Signed)
 Pt to ED with altered mental status that pt's friend reported to EMS has been getting worse since Saturday. Reports he started taking Trazadone, Seroquil, and Klonopin . Pt oxygen saturation was 68% on room air. Pt is A&O x2 with slurred speech, negative NIH on arrival.   CBG 174

## 2023-10-01 NOTE — ED Provider Notes (Signed)
 [facility] Provider Note    Event Date/Time   First MD Initiated Contact with Patient 10/01/23 [phone]     (approximate)   History   Chief Complaint Altered Mental Status   HPI  Stephen [name] is a 68 y.o. male with past medical history of CAD and COPD who presents to the ED complaining of altered mental status.  Patient reportedly lives and works at [employer], coworker there found him this morning very somnolent and slow to respond.  Family reportedly saw the patient 2 days ago, when he seemed to be having some mild difficulty breathing but did not want to go to the hospital.  EMS found patient somnolent but arousable to voice with oxygen saturations in the mid 80s on room air.  He was placed on a nonrebreather with improvement.  On arrival to the ED, patient arouses to voice but denies any complaints, states he feels good.  EMS did note that patient was found to have Klonopin , Seroquel, and trazodone nearby in his room.     Physical Exam   Triage Vital Signs: ED Triage Vitals  Encounter Vitals Group     BP 10/01/23 0836 (!) 187/155     Girls Systolic BP Percentile --      Girls Diastolic BP Percentile --      Boys Systolic BP Percentile --      Boys Diastolic BP Percentile --      Pulse Rate 10/01/23 0836 (!) 120     Resp 10/01/23 0836 (!) 45     Temp 10/01/23 0836 98 F (36.7 C)     Temp Source 10/01/23 0836 Oral     SpO2 10/01/23 0840 (!) 83 %     Weight --      Height --      Head Circumference --      Peak Flow --      Pain Score --      Pain Loc --      Pain Education --      Exclude from Growth Chart --     Most recent vital signs: Vitals:   10/01/23 0840 10/01/23 1011  BP:  120/63  Pulse: (!) 108 94  Resp: (!) 39 (!) 50  Temp:    SpO2: (!) 83% 100%    Constitutional: Somnolent but arousable to voice.  Oriented to person and place only. Eyes: Conjunctivae are normal.  Pupils equal, round, and reactive to light  bilaterally. Head: Atraumatic. Nose: No congestion/rhinnorhea. Mouth/Throat: Mucous membranes are moist.  Cardiovascular: Tachycardic, regular rhythm. Grossly normal heart sounds.  2+ radial pulses bilaterally. Respiratory: Tachypneic with increased respiratory effort and shallow respirations, lung sounds faint bilaterally. Gastrointestinal: Soft and nontender. No distention. Musculoskeletal: No lower extremity tenderness nor edema.  Neurologic:  Normal speech and language. No gross focal neurologic deficits are appreciated.    ED Results / Procedures / Treatments   Labs (all labs ordered are listed, but only abnormal results are displayed) Labs Reviewed  BLOOD GAS, VENOUS - Abnormal; Notable for the following components:      Result Value   pH, Ven 7.16 (*)    pCO2, Ven 110 (*)    pO2, Ven 62 (*)    Bicarbonate 39.2 (*)    Acid-Base Excess 6.4 (*)    All other components within normal limits  CBC WITH DIFFERENTIAL/PLATELET - Abnormal; Notable for the following components:   Neutro Abs 7.8 (*)    All other components  within normal limits  COMPREHENSIVE METABOLIC PANEL WITH GFR - Abnormal; Notable for the following components:   Chloride 92 (*)    CO2 35 (*)    Glucose, Bld 133 (*)    AST 14 (*)    All other components within normal limits  BLOOD GAS, VENOUS - Abnormal; Notable for the following components:   pH, Ven 7.13 (*)    pCO2, Ven 116 (*)    pO2, Ven 91 (*)    Bicarbonate 38.6 (*)    Acid-Base Excess 5.2 (*)    All other components within normal limits  TROPONIN I (HIGH SENSITIVITY) - Abnormal; Notable for the following components:   Troponin I (High Sensitivity) 34 (*)    All other components within normal limits  CULTURE, BLOOD (ROUTINE X 2)  CULTURE, BLOOD (ROUTINE X 2)  ETHANOL  BRAIN NATRIURETIC PEPTIDE  LACTIC ACID, PLASMA  URINE DRUG SCREEN, QUALITATIVE ([facility] ONLY)  TROPONIN I (HIGH SENSITIVITY)     EKG  ED ECG REPORT I, Carlin Palin, the  attending physician, personally viewed and interpreted this ECG.   Date: 10/01/2023  EKG Time: 8:33  Rate: 117  Rhythm: sinus tachycardia  Axis: Normal  Intervals:none  ST&T Change: None  RADIOLOGY Chest x-ray reviewed and interpreted by me with no infiltrate, edema, or effusion.  PROCEDURES:  Critical Care performed: Yes, see critical care procedure note(s)  .Critical Care  Performed by: Palin Carlin, MD Authorized by: Palin Carlin, MD   Critical care provider statement:    Critical care time (minutes):  30   Critical care time was exclusive of:  Separately billable procedures and treating other patients and teaching time   Critical care was necessary to treat or prevent imminent or life-threatening deterioration of the following conditions:  Respiratory failure   Critical care was time spent personally by me on the following activities:  Development of treatment plan with patient or surrogate, discussions with consultants, evaluation of patient's response to treatment, examination of patient, ordering and review of laboratory studies, ordering and review of radiographic studies, ordering and performing treatments and interventions, pulse oximetry, re-evaluation of patient's condition and review of old charts   I assumed direction of critical care for this patient from another provider in my specialty: no     Care discussed with: admitting provider   Procedure Name: Intubation Date/Time: 10/01/2023 10:18 AM  Performed by: Palin Carlin, MDPre-anesthesia Checklist: Patient identified, Patient being monitored, Emergency Drugs available, Timeout performed and Suction available Oxygen Delivery Method: Non-rebreather mask Preoxygenation: Pre-oxygenation with 100% oxygen Induction Type: Rapid sequence Ventilation: Mask ventilation without difficulty Laryngoscope Size: Glidescope and 4 Grade View: Grade I Tube size: 8.0 mm Number of attempts: 1 Airway Equipment and Method:  Video-laryngoscopy Placement Confirmation: ETT inserted through vocal cords under direct vision, CO2 detector and Breath sounds checked- equal and bilateral Secured at: 25 cm Tube secured with: ETT holder Dental Injury: Teeth and Oropharynx as per pre-operative assessment        MEDICATIONS ORDERED IN ED: Medications  iohexol  (OMNIPAQUE ) 350 MG/ML injection 75 mL (has no administration in time range)  etomidate (AMIDATE) 2 MG/ML injection (has no administration in time range)  ceFEPIme (MAXIPIME) 2 g in sodium chloride 0.9 % 100 mL IVPB (0 g Intravenous Stopped 10/01/23 0931)  metroNIDAZOLE (FLAGYL) IVPB 500 mg (0 mg Intravenous Stopped 10/01/23 1008)  vancomycin (VANCOCIN) IVPB 1000 mg/200 mL premix (1,000 mg Intravenous New Bag/Given 10/01/23 0852)  ipratropium-albuterol  (DUONEB) 0.5-2.5 (3) MG/3ML nebulizer solution 9 mL (  9 mLs Nebulization Given 10/01/23 0855)  methylPREDNISolone  sodium succinate (SOLU-MEDROL ) 125 mg/2 mL injection 125 mg (125 mg Intravenous Given 10/01/23 0853)  etomidate (AMIDATE) injection 30 mg (30 mg Intravenous Given 10/01/23 1013)  rocuronium (ZEMURON) injection 100 mg (100 mg Intravenous Given 10/01/23 1013)     IMPRESSION / MDM / ASSESSMENT AND PLAN / ED COURSE  I reviewed the triage vital signs and the nursing notes.                              68 y.o. male with a past medical history of CAD and COPD who presents to the ED with altered mental status after being found somnolent by coworker this morning.  Patient's presentation is most consistent with acute presentation with potential threat to life or bodily function.  Differential diagnosis includes, but is not limited to, sepsis, hypercapnic respiratory failure, pneumonia, UTI, anemia, electrolyte abnormality, AKI, ACS, PE, pneumothorax, CHF, COPD exacerbation.  Patient ill-appearing on arrival to the ED, tachycardic and tachypneic with room air oxygen saturations of 83%.  Lung sounds are faint  bilaterally, patient seen recently for COPD exacerbation and will treat with IV Solu-Medrol  as well as DuoNebs.  Presentation also concerning for sepsis and we will start broad-spectrum antibiotics, chest x-ray pending at this time.  VBG shows significant hypercapnia, will trial BiPAP but if patient does not show signs of improvement, then he may require intubation.  EKG shows sinus tachycardia with no ischemic changes, lower suspicion for ACS or PE at this time.  CT head is negative for acute process, chest x-ray also unremarkable.  Labs without significant anemia, leukocytosis, electrolyte abnormality, or AKI.  Troponin mildly elevated but suspect this is due to his respiratory failure rather than ACS or PE.  Lactic acid within normal limits, patient has received broad-spectrum antibiotics but sepsis seems less likely at this time.  Patient's respiratory effort has decreased on reassessment and repeat VBG is worsening.  Decision was made to proceed with intubation and case discussed with ICU team for admission.      FINAL CLINICAL IMPRESSION(S) / ED DIAGNOSES   Final diagnoses:  Altered mental status, unspecified altered mental status type  COPD exacerbation (HCC)  Acute hypercapnic respiratory failure (HCC)     Rx / DC Orders   ED Discharge Orders     None        Note:  This document was prepared using Dragon voice recognition software and may include unintentional dictation errors.   Willo Dunnings, MD 10/01/23 1020

## 2023-10-01 NOTE — H&P (Signed)
 NAME:  Stephen [name], MRN:  [id number], DOB:  Aug 04, 1955, LOS: 0 ADMISSION DATE:  10/01/2023, CONSULTATION DATE:  10/01/2023 REFERRING MD:  Carlin Palin, MD, CHIEF COMPLAINT:  Respiratory Failure   History of Present Illness:   Patient is a 68 year old male with a past medical history of advanced COPD who presented to the hospital with increased shortness of breath and respiratory failure secondary to COPD exacerbation.  Patient was intubated and sedated at the time of my evaluation.  History was obtained through collateral from his son as well as review of the medical record.  Patient had been feeling increased shortness of breath of the past few days, acute on chronic given his underlying COPD.  [place] a local hotel where he was found somnolent and slow to respond.  EMS was activated and he was brought in by ambulance hypoxic with minimal air movement and diffuse wheeze.  In the ED, initial blood gas showed acute on chronic respiratory acidosis with profound hypercapnia that failed to improve with BiPAP.  With worsening mental status and worsening acidemia, decision was made by the ED physician to intubate the patient and admit to the ICU for further management.  Subsequent ABG following intubation showed improvement with pH of 7.33 and CO2 of 68.  CT of the chest with PE protocol was performed showing no pulmonary embolism but with significant emphysema.  Patient has known underlying severe COPD followed by Dr. Theotis from [facility] pulmonology.  He is an active smoker.  He is maintained on triple therapy with Breztri , Roflumilast, and nebulizers.  I do not have access to the patient's outpatient PFTs.  Patient was seen in the ED in March 2025 with a COPD exacerbation treated with steroids and antibiotics.  He is also received courses of steroids and antibiotics and January and February of this year for similar chief complaint.  Pertinent  Medical History  -Advanced COPD  Significant  Hospital Events: Including procedures, antibiotic start and stop dates in addition to other pertinent events   10/01/2023: Admitted to hospital, intubated on arrival  Interim History / Subjective:  Patient sedated and unresponsive.  Son at bedside, updated about father's condition.  Objective    Blood pressure (!) 148/84, pulse (!) 102, temperature 98 F (36.7 C), temperature source Oral, resp. rate 18, height 6' (1.829 m), weight 97.6 kg, SpO2 100%.    Vent Mode: PRVC FiO2 (%):  [35 %-40 %] 35 % Set Rate:  [18 bmp-20 bmp] 20 bmp Vt Set:  [500 mL] 500 mL PEEP:  [5 cmH20] 5 cmH20  No intake or output data in the 24 hours ending 10/01/23 1117 Filed Weights   10/01/23 1003  Weight: 97.6 kg    Examination: Physical Exam Constitutional:      General: He is not in acute distress.    Appearance: He is ill-appearing.   Cardiovascular:     Rate and Rhythm: Normal rate and regular rhythm.     Heart sounds: Normal heart sounds.  Pulmonary:     Effort: Pulmonary effort is normal.     Breath sounds: Wheezing (diffuse expiratory) present.   Neurological:     Mental Status: He is disoriented.      Assessment and Plan   Neurology #Toxic Metabolic Encephalopathy #CO2 Narcosis  Baseline on Klonopin , Seroquel, and trazodone.  Presented with encephalopathy secondary to CO2 narcosis.  Currently intubated and vented, with improvement in CO2.  Have initiated propofol and fentanyl for analgesia and sedation.  Discontinued rest of  his outpatient psychotropic meds.  -propofol to maintain RASS goal of -1 -fentanyl to maintain CPOT <2 -Daily wake up assessment  Cardiovascular #Circulatory shock #Sedation induced hypotension  He had a recent echocardiogram that was within normal that did not show any signs of pulm hypertension.  Did have mild troponin elevation in setting of his acute illness which we will trend to ensure he is not developing acute coronary syndrome.  With propofol, he  developed some hypotension that is required low-dose peripheral vasopressor support with norepinephrine.  -Trend troponin -Norepinephrine for goal MAP greater than 65  Pulmonary #Acute on Chronic Hypoxic and Hypercapnic Respiratory Failure #COPD exacerbation  Patient with history of advanced COPD maintained on triple therapy and Roflumilast and followed outpatient.  Unclear what his PFTs show but based on notes, this is end-stage.  He has had multiple exacerbations over the past 6 months.  CT scan of the chest with PE protocol performed to rule out VTE given his recurrent exacerbations.  This was negative for clot.  He was intubated given his respiratory failure and with improvement in minute ventilation his CO2 is nearly back to baseline.  Currently maintained on steroids twice daily and standing nebulizers.  -trend blood gas -standing nebs q 6 hours -Solu-Medrol  40 twice daily -antibiotics per ID section -CT/PE negative for clot  Gastrointestinal  -PPI for SUP -Tube feeds in a.m.  Renal  Kidney function normal on a.m. labs.  No electrolyte abnormalities noted.  Will continue to trend.  Endocrine  ICU glycemic protocol given he is on steroids  Hem/Onc  -lovenox for DVT prophylaxis  ID #Acute exacerbation of COPD  No sign of pneumonia on CT scan of the chest but he did present with COPD exacerbation.  Will treat with broad-spectrum antibiotics with ceftriaxone  and azithromycin , check full respiratory viral panel, and MRSA screen.  -CTX/Azithromycin  -respiratory cultures -full viral panel, check for COVID -MRSA screen  Best Practice (right click and Reselect all SmartList Selections daily)   Diet/type: NPO DVT prophylaxis LMWH Pressure ulcer(s): N/A GI prophylaxis: PPI Lines: N/A Foley:  Yes, and it is still needed Code Status:  full code Last date of multidisciplinary goals of care discussion [10/01/2023]  Labs   CBC: Recent Labs  Lab 10/01/23 0844  WBC  9.9  NEUTROABS 7.8*  HGB 15.6  HCT 50.1  MCV 100.0  PLT 275    Basic Metabolic Panel: Recent Labs  Lab 10/01/23 0844  NA 135  K 4.7  CL 92*  CO2 35*  GLUCOSE 133*  BUN 13  CREATININE 0.63  CALCIUM  9.1   GFR: Estimated Creatinine Clearance: 107 mL/min (by C-G formula based on SCr of 0.63 mg/dL). Recent Labs  Lab 10/01/23 0844  WBC 9.9  LATICACIDVEN 0.7    Liver Function Tests: Recent Labs  Lab 10/01/23 0844  AST 14*  ALT 9  ALKPHOS 66  BILITOT 1.0  PROT 7.0  ALBUMIN 3.8   No results for input(s): LIPASE, AMYLASE in the last 168 hours. No results for input(s): AMMONIA in the last 168 hours.  ABG    Component Value Date/Time   PHART 7.33 (L) 10/01/2023 1050   PCO2ART 68 (HH) 10/01/2023 1050   PO2ART 168 (H) 10/01/2023 1050   HCO3 35.9 (H) 10/01/2023 1050   O2SAT 100 10/01/2023 1050     Coagulation Profile: No results for input(s): INR, PROTIME in the last 168 hours.  Cardiac Enzymes: No results for input(s): CKTOTAL, CKMB, CKMBINDEX, TROPONINI in the last 168 hours.  HbA1C: Hgb A1c MFr Bld  Date/Time Value Ref Range Status  08/09/2022 04:01 PM 6.5 (H) <5.7 % of total Hgb Final    Comment:    For someone without known diabetes, a hemoglobin A1c value of 6.5% or greater indicates that they may have  diabetes and this should be confirmed with a follow-up  test. . For someone with known diabetes, a value <7% indicates  that their diabetes is well controlled and a value  greater than or equal to 7% indicates suboptimal  control. A1c targets should be individualized based on  duration of diabetes, age, comorbid conditions, and  other considerations. . Currently, no consensus exists regarding use of hemoglobin A1c for diagnosis of diabetes for children. SABRA   08/25/2021 10:46 AM 5.8 (H) <5.7 % of total Hgb Final    Comment:    For someone without known diabetes, a hemoglobin  A1c value between 5.7% and 6.4% is consistent  with prediabetes and should be confirmed with a  follow-up test. . For someone with known diabetes, a value <7% indicates that their diabetes is well controlled. A1c targets should be individualized based on duration of diabetes, age, comorbid conditions, and other considerations. . This assay result is consistent with an increased risk of diabetes. . Currently, no consensus exists regarding use of hemoglobin A1c for diagnosis of diabetes for children. .     CBG: No results for input(s): GLUCAP in the last 168 hours.  Review of Systems:   N/A  Past Medical History:  He,  has a past medical history of Allergy, Anxiety, CAD in native artery, COPD (chronic obstructive pulmonary disease) (HCC), and Thoracic aortic atherosclerosis (HCC).   Surgical History:  History reviewed. No pertinent surgical history.   Social History:   reports that he has been smoking cigarettes. He has a 45 pack-year smoking history. He has never used smokeless tobacco. He reports that he does not drink alcohol and does not use drugs.   Family History:  His family history includes Alzheimer's disease in his brother and father; Hypertension in his mother; Hypothyroidism in his mother.   Allergies No Known Allergies   Home Medications  Prior to Admission medications   Medication Sig Start Date End Date Taking? Authorizing Provider  albuterol  (VENTOLIN  HFA) 108 (90 Base) MCG/ACT inhaler INHALE 2 PUFFS INTO THE LUNGS EVERY 6 HOURS AS NEEDED FOR WHEEZING ORSHORTNESS OF BREATH 08/25/21  Yes Sowles, Krichna, MD  BREZTRI  AEROSPHERE 160-9-4.8 MCG/ACT AERO inhaler Inhale 2 puffs into the lungs 2 (two) times daily. 07/20/23 07/19/24 Yes [provider]  buPROPion (WELLBUTRIN XL) 150 MG 24 hr tablet Take 150 mg by mouth daily. 05/16/23  Yes [provider]  ipratropium-albuterol  (DUONEB) 0.5-2.5 (3) MG/3ML SOLN Inhale 3 mLs into the lungs 3 (three) times daily. 06/26/23 06/20/24 Yes [provider]  montelukast (SINGULAIR) 10 MG tablet Take 10 mg by mouth daily. 04/20/23  Yes [provider]  temazepam (RESTORIL) 15 MG capsule Take 15 mg by mouth at bedtime as needed. 05/16/23  Yes [provider]  traZODone (DESYREL) 100 MG tablet Take 100 mg by mouth at bedtime. 07/13/23  Yes [provider]  traZODone (DESYREL) 50 MG tablet Take 50 mg by mouth at bedtime. 04/20/23  Yes [provider]  aspirin  EC 81 MG tablet Take 1 tablet (81 mg total) by mouth daily. Swallow whole. 08/25/21   Sowles, Krichna, MD  clonazePAM  (KLONOPIN ) 1 MG tablet Take 1 mg by mouth at bedtime as  needed.    [provider]  Fluticasone-Umeclidin-Vilant (TRELEGY ELLIPTA ) 100-62.5-25 MCG/ACT AEPB Inhale 1 puff into the lungs daily. 12/14/22   Sowles, Krichna, MD  Methylcobalamin (B-12) 1000 MCG TBDP PLACE 1 TABLET UNDER THE TONGUE DAILY 08/15/22   Sowles, Krichna, MD  predniSONE  (DELTASONE ) 10 MG tablet Take 1 tablet (10 mg total) by mouth daily. Day 1-3: take 4 tablets PO daily Day 4-6: take 3 tablets PO daily Day 7-9: take 2 tablets PO daily Day 10-12: take 1 tablet PO daily 06/19/23   Paduchowski, Kevin, MD  roflumilast (DALIRESP) 500 MCG TABS tablet Take 500 mcg by mouth daily.    [provider]  rosuvastatin  (CRESTOR ) 10 MG tablet Take 1 tablet (10 mg total) by mouth daily. 12/14/22   Sowles, Krichna, MD  tamsulosin  (FLOMAX ) 0.4 MG CAPS capsule Take 1 capsule (0.4 mg total) by mouth daily. 12/14/22   Sowles, Krichna, MD     The patient is critically ill due to respiratory failure, COPD exacerbation.  Critical care was necessary to treat or prevent imminent or life-threatening deterioration. Critical care time was spent by me on the following activities: development of a treatment plan with the patient and/or surrogate as well as nursing, discussions with consultants, evaluation of the patient's response to treatment, examination of the patient, obtaining a  history from the patient or surrogate, ordering and performing treatments and interventions, ordering and review of laboratory studies, ordering and review of radiographic studies, review of telemetry data including pulse oximetry, re-evaluation of patient's condition and participation in multidisciplinary rounds.   I personally spent 65 minutes providing critical care not including any separately billable procedures.   Belva November, MD [facility] 10/01/2023 4:52 PM

## 2023-10-01 NOTE — Sedation Documentation (Signed)
 Pt intubated 8.0 tube 25 at the lip, color change noted, bagging easily.

## 2023-10-01 NOTE — Consult Note (Signed)
 CODE SEPSIS - PHARMACY COMMUNICATION  **Broad Spectrum Antibiotics should be administered within 1 hour of Sepsis diagnosis**  Time Code Sepsis Called/Page Received: 9155  Antibiotics Ordered: vancomycin, cefepime, flagyl  Time of 1st antibiotic administration: 0850  Additional action taken by pharmacy: n/a  If necessary, Name of Provider/Nurse Contacted: n/a    Stephen [name] A Stephen [name] ,PharmD Clinical Pharmacist  10/01/2023  9:17 AM

## 2023-10-01 NOTE — Progress Notes (Signed)
 Elink following for sepsis protocol.

## 2023-10-02 ENCOUNTER — Other Ambulatory Visit: Payer: Self-pay

## 2023-10-02 DIAGNOSIS — J9622 Acute and chronic respiratory failure with hypercapnia: Secondary | ICD-10-CM | POA: Diagnosis not present

## 2023-10-02 DIAGNOSIS — G928 Other toxic encephalopathy: Secondary | ICD-10-CM | POA: Diagnosis not present

## 2023-10-02 DIAGNOSIS — J9621 Acute and chronic respiratory failure with hypoxia: Secondary | ICD-10-CM | POA: Diagnosis not present

## 2023-10-02 DIAGNOSIS — I952 Hypotension due to drugs: Secondary | ICD-10-CM | POA: Diagnosis not present

## 2023-10-02 DIAGNOSIS — E871 Hypo-osmolality and hyponatremia: Secondary | ICD-10-CM

## 2023-10-02 LAB — CBC
HCT: 44.2 % (ref 39.0–52.0)
Hemoglobin: 14.7 g/dL (ref 13.0–17.0)
MCH: 32.1 pg (ref 26.0–34.0)
MCHC: 33.3 g/dL (ref 30.0–36.0)
MCV: 96.5 fL (ref 80.0–100.0)
Platelets: 215 10*3/uL (ref 150–400)
RBC: 4.58 MIL/uL (ref 4.22–5.81)
RDW: 13.1 % (ref 11.5–15.5)
WBC: 8.5 10*3/uL (ref 4.0–10.5)
nRBC: 0 % (ref 0.0–0.2)

## 2023-10-02 LAB — GLUCOSE, CAPILLARY
Glucose-Capillary: 123 mg/dL — ABNORMAL HIGH (ref 70–99)
Glucose-Capillary: 130 mg/dL — ABNORMAL HIGH (ref 70–99)
Glucose-Capillary: 152 mg/dL — ABNORMAL HIGH (ref 70–99)
Glucose-Capillary: 160 mg/dL — ABNORMAL HIGH (ref 70–99)
Glucose-Capillary: 163 mg/dL — ABNORMAL HIGH (ref 70–99)
Glucose-Capillary: 87 mg/dL (ref 70–99)
Glucose-Capillary: 97 mg/dL (ref 70–99)
Glucose-Capillary: 99 mg/dL (ref 70–99)

## 2023-10-02 LAB — BASIC METABOLIC PANEL WITH GFR
Anion gap: 11 (ref 5–15)
BUN: 18 mg/dL (ref 8–23)
CO2: 30 mmol/L (ref 22–32)
Calcium: 8.9 mg/dL (ref 8.9–10.3)
Chloride: 92 mmol/L — ABNORMAL LOW (ref 98–111)
Creatinine, Ser: 0.84 mg/dL (ref 0.61–1.24)
GFR, Estimated: >60 mL/min (ref >60)
Glucose, Bld: 130 mg/dL — ABNORMAL HIGH (ref 70–99)
Potassium: 4.4 mmol/L (ref 3.5–5.1)
Sodium: 133 mmol/L — ABNORMAL LOW (ref 135–145)

## 2023-10-02 LAB — TRIGLYCERIDES: Triglycerides: 77 mg/dL (ref <150)

## 2023-10-02 LAB — MAGNESIUM: Magnesium: 1.8 mg/dL (ref 1.7–2.4)

## 2023-10-02 MED ORDER — TAMSULOSIN HCL 0.4 MG PO CAPS
0.4000 mg | ORAL_CAPSULE | Freq: Every day | ORAL | Status: DC
  Administered 2023-10-02 – 2023-10-04 (×3): 0.4 mg via ORAL
  Filled 2023-10-02 (×3): qty 1

## 2023-10-02 MED ORDER — CHLORHEXIDINE GLUCONATE CLOTH 2 % EX PADS
6.0000 | MEDICATED_PAD | Freq: Every day | CUTANEOUS | Status: DC
  Administered 2023-10-02 – 2023-10-04 (×3): 6 via TOPICAL

## 2023-10-02 MED ORDER — METHYLPREDNISOLONE SODIUM SUCC 40 MG IJ SOLR
40.0000 mg | INTRAMUSCULAR | Status: DC
  Administered 2023-10-02 – 2023-10-04 (×3): 40 mg via INTRAVENOUS
  Filled 2023-10-02 (×3): qty 1

## 2023-10-02 MED ORDER — CLONAZEPAM 1 MG PO TABS
1.0000 mg | ORAL_TABLET | Freq: Every day | ORAL | Status: DC | PRN
  Administered 2023-10-02 – 2023-10-03 (×2): 1 mg via ORAL
  Filled 2023-10-02 (×2): qty 1

## 2023-10-02 MED ORDER — MAGNESIUM SULFATE 2 GM/50ML IV SOLN
2.0000 g | Freq: Once | INTRAVENOUS | Status: AC
  Administered 2023-10-02: 2 g via INTRAVENOUS
  Filled 2023-10-02: qty 50

## 2023-10-02 MED ORDER — BUPROPION HCL ER (XL) 150 MG PO TB24
150.0000 mg | ORAL_TABLET | Freq: Every day | ORAL | Status: DC
  Administered 2023-10-02 – 2023-10-05 (×4): 150 mg via ORAL
  Filled 2023-10-02 (×4): qty 1

## 2023-10-02 MED ORDER — NICOTINE 21 MG/24HR TD PT24
21.0000 mg | MEDICATED_PATCH | Freq: Every day | TRANSDERMAL | Status: DC
  Administered 2023-10-02 – 2023-10-05 (×4): 21 mg via TRANSDERMAL
  Filled 2023-10-02 (×4): qty 1

## 2023-10-02 MED ORDER — ARFORMOTEROL TARTRATE 15 MCG/2ML IN NEBU
15.0000 ug | INHALATION_SOLUTION | Freq: Two times a day (BID) | RESPIRATORY_TRACT | Status: DC
  Administered 2023-10-02 – 2023-10-04 (×6): 15 ug via RESPIRATORY_TRACT
  Filled 2023-10-02 (×8): qty 2

## 2023-10-02 NOTE — TOC CM/SW Note (Addendum)
 LCSWA delivered patients son Stephen [name] list of [facility] agencies and will follow up tomorrow.   LCSWA delivered behavioral health resources and housing resources to son Stephen [name] and patient.   TOC team to follow for discharge planning.

## 2023-10-02 NOTE — Progress Notes (Signed)
 NAME:  Domnique Vantine, MRN:  [id number], DOB:  11-26-1955, LOS: 1 ADMISSION DATE:  10/01/2023, CONSULTATION DATE:  10/01/2023 REFERRING MD:  Carlin Palin, MD, CHIEF COMPLAINT:  Respiratory Failure   History of Present Illness:   Patient is a 68 year old male with a past medical history of advanced COPD who presented to the hospital with increased shortness of breath and respiratory failure secondary to COPD exacerbation.  Patient was intubated and sedated at the time of my evaluation.  History was obtained through collateral from his son as well as review of the medical record.  Patient had been feeling increased shortness of breath of the past few days, acute on chronic given his underlying COPD.  He lives at a local hotel where he was found somnolent and slow to respond.  EMS was activated and he was brought in by ambulance hypoxic with minimal air movement and diffuse wheeze.  In the ED, initial blood gas showed acute on chronic respiratory acidosis with profound hypercapnia that failed to improve with BiPAP.  With worsening mental status and worsening acidemia, decision was made by the ED physician to intubate the patient and admit to the ICU for further management.  Subsequent ABG following intubation showed improvement with pH of 7.33 and CO2 of 68.  CT of the chest with PE protocol was performed showing no pulmonary embolism but with significant emphysema.  Patient has known underlying severe COPD followed by Dr. Theotis from [facility] pulmonology.  He is an active smoker.  He is maintained on triple therapy with Breztri , Roflumilast, and nebulizers.  I do not have access to the patient's outpatient PFTs.  Patient was seen in the ED in March 2025 with a COPD exacerbation treated with steroids and antibiotics.  He is also received courses of steroids and antibiotics and January and February of this year for similar chief complaint.  Pertinent  Medical History  -Advanced COPD  Micro Data:    COVID/Influenza A&B/RSV 06/29>>negative  MRSA PCR 06/29>>negative  RVP 06/29>>negative  Blood x1 06/29>>NGTD   Anti-infectives (From admission, onward)    Start     Dose/Rate Route Frequency Ordered Stop   10/01/23 1200  cefTRIAXone  (ROCEPHIN ) 2 g in sodium chloride 0.9 % 100 mL IVPB        2 g 200 mL/hr over 30 Minutes Intravenous Daily 10/01/23 1131     10/01/23 1200  azithromycin  (ZITHROMAX ) 500 mg in sodium chloride 0.9 % 250 mL IVPB        500 mg 250 mL/hr over 60 Minutes Intravenous Daily 10/01/23 1131     10/01/23 0845  ceFEPIme (MAXIPIME) 2 g in sodium chloride 0.9 % 100 mL IVPB        2 g 200 mL/hr over 30 Minutes Intravenous  Once 10/01/23 0843 10/01/23 0931   10/01/23 0845  metroNIDAZOLE (FLAGYL) IVPB 500 mg        500 mg 100 mL/hr over 60 Minutes Intravenous  Once 10/01/23 0843 10/01/23 1008   10/01/23 0845  vancomycin (VANCOCIN) IVPB 1000 mg/200 mL premix        1,000 mg 200 mL/hr over 60 Minutes Intravenous  Once 10/01/23 0843 10/01/23 1045      Significant Hospital Events: Including procedures, antibiotic start and stop dates in addition to other pertinent events   10/01/23: Admitted to hospital, intubated on arrival    10/02/23: Pt remains mechanically intubated on minimal vent settings pending WUA and SBT  Interim History / Subjective:  No acute events overnight vital  signs stable   Objective    Blood pressure 135/89, pulse 69, temperature (!) 97.3 F (36.3 C), temperature source Bladder, resp. rate 20, height 6' (1.829 m), weight 86.1 kg, SpO2 95%.    Vent Mode: PRVC FiO2 (%):  [35 %-40 %] 35 % Set Rate:  [18 bmp-20 bmp] 20 bmp Vt Set:  [500 mL] 500 mL PEEP:  [5 cmH20] 5 cmH20 Plateau Pressure:  [14 cmH20-19 cmH20] 15 cmH20   Intake/Output Summary (Last 24 hours) at 10/02/2023 0728 Last data filed at 10/02/2023 0600 Gross per 24 hour  Intake 1628.61 ml  Output 925 ml  Net 703.61 ml   Filed Weights   10/01/23 1003 10/01/23 1257 10/02/23 0335   Weight: 97.6 kg 86.4 kg 86.1 kg    Examination: General: Acutely-ill appearing male, NAD mechanically intubated  HENT: Supple, no JVD  Lungs: Diminished throughout, even, non labored  Cardiovascular: Sinus rhythm, s1s2, no m/r/g, 2+ radial/2+ distal pulses, no edema Abdomen: +BS x4, soft, non distended  Extremities: Normal bulk and tone Skin: Intact no rashes/lesions/pressure injuries  Neuro: Sedated, not following commands, withdraws from painful stimulation, PERRL GU: Indwelling foley catheter draining yellow urine   Assessment and Plan   #Acute toxic metabolic encephalopathy #CO2 narcosis #Mechanical intubation pain/discomfort  - Maintain RASS goal of 0 to -1 - PAD protocol to maintain RASS goal: Propofol gtt and prn fentanyl  - Daily wake up assessment  #Circulatory shock #Sedation induced hypotension #Mildly elevated troponin secondary to demand ischemia  Hx: CAD and thoracic aortic atherosclerosis  - Continuous telemetry monitoring  - Troponin peaked at 49 - Norepinephrine for goal MAP greater than 65  #Acute on chronic hypoxic and hypercapnic respiratory failure #COPD exacerbation #Mechanical Intubation  - Full vent support for now: vent settings reviewed and established  - Continue lung protective strategies  - Maintain plateau pressures less than 30 cm H2O - VAP protocol implemented - SBT once all parameters met  - Intermittent CXR's and ABG's - Scheduled bronchodilator therapy  - IV steroids   #Mild hyponatremia  #Mild hypomagnesia  - Trend BMP  - Replace electrolytes as indicated  - Strict I&O's  ID #Acute exacerbation of COPD no signs of pneumonia on CT Chest/CXR - Trend WBC and monitor fever curve  - Follow cultures  - Continue azithromycin  and ceftriaxone  pending culture results and sensitivities   #Endo - CBG's q4hrs  - SSI  - Follow hypo/hyperglycemic protocol  - Target CBG's 140 to 180   Best Practice (right click and Reselect all  SmartList Selections daily)   Diet/type: NPO DVT prophylaxis LMWH Pressure ulcer(s): N/A GI prophylaxis: PPI Lines: N/A Foley:  Yes, and it is still needed will place orders to discontinue once extubated  Code Status:  full code Last date of multidisciplinary goals of care discussion [10/02/2023]  Will update pts son when he arrives at bedside  Labs   CBC: Recent Labs  Lab 10/01/23 0844 10/02/23 0705  WBC 9.9 8.5  NEUTROABS 7.8*  --   HGB 15.6 14.7  HCT 50.1 44.2  MCV 100.0 96.5  PLT 275 215    Basic Metabolic Panel: Recent Labs  Lab 10/01/23 0844 10/02/23 0706  NA 135 133*  K 4.7 4.4  CL 92* 92*  CO2 35* 30  GLUCOSE 133* 130*  BUN 13 18  CREATININE 0.63 0.84  CALCIUM  9.1 8.9  MG  --  1.8   GFR: Estimated Creatinine Clearance: 92.4 mL/min (by C-G formula based on SCr of 0.84  mg/dL). Recent Labs  Lab 10/01/23 0844 10/02/23 0705  WBC 9.9 8.5  LATICACIDVEN 0.7  --     Liver Function Tests: Recent Labs  Lab 10/01/23 0844  AST 14*  ALT 9  ALKPHOS 66  BILITOT 1.0  PROT 7.0  ALBUMIN 3.8   No results for input(s): LIPASE, AMYLASE in the last 168 hours. No results for input(s): AMMONIA in the last 168 hours.  ABG    Component Value Date/Time   PHART 7.36 10/01/2023 1348   PCO2ART 62 (H) 10/01/2023 1348   PO2ART 87 10/01/2023 1348   HCO3 35.0 (H) 10/01/2023 1348   O2SAT 98.1 10/01/2023 1348     Coagulation Profile: Recent Labs  Lab 10/01/23 1230  INR 0.9    Cardiac Enzymes: No results for input(s): CKTOTAL, CKMB, CKMBINDEX, TROPONINI in the last 168 hours.  HbA1C: Hgb A1c MFr Bld  Date/Time Value Ref Range Status  08/09/2022 04:01 PM 6.5 (H) <5.7 % of total Hgb Final    Comment:    For someone without known diabetes, a hemoglobin A1c value of 6.5% or greater indicates that they may have  diabetes and this should be confirmed with a follow-up  test. . For someone with known diabetes, a value <7% indicates  that their  diabetes is well controlled and a value  greater than or equal to 7% indicates suboptimal  control. A1c targets should be individualized based on  duration of diabetes, age, comorbid conditions, and  other considerations. . Currently, no consensus exists regarding use of hemoglobin A1c for diagnosis of diabetes for children. SABRA   08/25/2021 10:46 AM 5.8 (H) <5.7 % of total Hgb Final    Comment:    For someone without known diabetes, a hemoglobin  A1c value between 5.7% and 6.4% is consistent with prediabetes and should be confirmed with a  follow-up test. . For someone with known diabetes, a value <7% indicates that their diabetes is well controlled. A1c targets should be individualized based on duration of diabetes, age, comorbid conditions, and other considerations. . This assay result is consistent with an increased risk of diabetes. . Currently, no consensus exists regarding use of hemoglobin A1c for diagnosis of diabetes for children. .     CBG: Recent Labs  Lab 10/01/23 1441 10/01/23 1938 10/02/23 0012 10/02/23 0330  GLUCAP 162* 175* 163* 160*    Review of Systems:   Unable to assess pt mechanically intubated  Past Medical History:  He,  has a past medical history of Allergy, Anxiety, CAD in native artery, COPD (chronic obstructive pulmonary disease) (HCC), and Thoracic aortic atherosclerosis (HCC).   Surgical History:  History reviewed. No pertinent surgical history.   Social History:   reports that he has been smoking cigarettes. He has a 45 pack-year smoking history. He has never used smokeless tobacco. He reports that he does not drink alcohol and does not use drugs.   Family History:  His family history includes Alzheimer's disease in his brother and father; Hypertension in his mother; Hypothyroidism in his mother.   Allergies No Known Allergies   Home Medications  Prior to Admission medications   Medication Sig Start Date End Date Taking?  Authorizing Provider  albuterol  (VENTOLIN  HFA) 108 (90 Base) MCG/ACT inhaler INHALE 2 PUFFS INTO THE LUNGS EVERY 6 HOURS AS NEEDED FOR WHEEZING ORSHORTNESS OF BREATH 08/25/21  Yes Sowles, Krichna, MD  BREZTRI  AEROSPHERE 160-9-4.8 MCG/ACT AERO inhaler Inhale 2 puffs into the lungs 2 (two) times daily. 07/20/23 07/19/24 Yes  [provider]  buPROPion (WELLBUTRIN XL) 150 MG 24 hr tablet Take 150 mg by mouth daily. 05/16/23  Yes [provider]  ipratropium-albuterol  (DUONEB) 0.5-2.5 (3) MG/3ML SOLN Inhale 3 mLs into the lungs 3 (three) times daily. 06/26/23 06/20/24 Yes [provider]  montelukast (SINGULAIR) 10 MG tablet Take 10 mg by mouth daily. 04/20/23  Yes [provider]  temazepam (RESTORIL) 15 MG capsule Take 15 mg by mouth at bedtime as needed. 05/16/23  Yes [provider]  traZODone (DESYREL) 100 MG tablet Take 100 mg by mouth at bedtime. 07/13/23  Yes [provider]  traZODone (DESYREL) 50 MG tablet Take 50 mg by mouth at bedtime. 04/20/23  Yes [provider]  aspirin  EC 81 MG tablet Take 1 tablet (81 mg total) by mouth daily. Swallow whole. 08/25/21   Sowles, Krichna, MD  clonazePAM  (KLONOPIN ) 1 MG tablet Take 1 mg by mouth at bedtime as needed.    [provider]  Fluticasone-Umeclidin-Vilant (TRELEGY ELLIPTA ) 100-62.5-25 MCG/ACT AEPB Inhale 1 puff into the lungs daily. 12/14/22   Sowles, Krichna, MD  Methylcobalamin (B-12) 1000 MCG TBDP PLACE 1 TABLET UNDER THE TONGUE DAILY 08/15/22   Sowles, Krichna, MD  predniSONE  (DELTASONE ) 10 MG tablet Take 1 tablet (10 mg total) by mouth daily. Day 1-3: take 4 tablets PO daily Day 4-6: take 3 tablets PO daily Day 7-9: take 2 tablets PO daily Day 10-12: take 1 tablet PO daily 06/19/23   Paduchowski, Kevin, MD  roflumilast (DALIRESP) 500 MCG TABS tablet Take 500 mcg by mouth daily.    [provider]  rosuvastatin  (CRESTOR ) 10 MG tablet Take 1 tablet (10 mg total) by mouth daily.  12/14/22   Sowles, Krichna, MD  tamsulosin  (FLOMAX ) 0.4 MG CAPS capsule Take 1 capsule (0.4 mg total) by mouth daily. 12/14/22   Sowles, Krichna, MD     Critical Care Time: 40 minutes   Lonell Moose, [facility]  Pulmonary/Critical Care Pager [phone] (please enter 7 digits) PCCM Consult Pager [phone] (please enter 7 digits)

## 2023-10-02 NOTE — Discharge Instructions (Addendum)
No Driving   Intensive Outpatient Programs   [facility] [address] [place],  [place], [place] [phone]  [facility] (Inpatient and outpatient)[phone] (Suboxone and Methadone) [address] [phone]  ADS: Alcohol [facility] Drug [facility] Programs - Intensive Outpatient [address] [place], [place] [address], [place]  [phone]  [facility] (Outpatient, Inpatient, [facility] (Groups and Residental) (insurance only) [phone] [place], [place] [phone]   [facility] (for caregivers and family) [address] Laurell Josephs [address], [place], [place] [phone]  Residential Treatment Programs  [facility](2 years) Residential: 2 days)[facility] [address] [place], [place], [place] [phone] or [phone]  [facility] [address] [place], [place], [place] [phone]  [facility] ([facility]) [address] [place], [place], [place] [phone] Admissions: 8am-3pm M-F  [facility] Program: Residential Program [phone] Days)             [facility]: [facility]  [place], [place], [place]  [phone] or [phone] in Hours over the weekend or by referral)  [facility] [address] [place], [place] [address] [phone] (Do virtual or phone assessment, offer transportation within 25 miles, have in patient and Outpatient options)   Mobil Crisis: [facility]:[phone]  (for crisis response 24 hours a day)      Rent/Utility/Housing  Agency Name: [facility] Address: [address] [place], [place] [address] Phone: [phone] Email: [email or web address] Website: www.[email or web address].org Service(s) Offered: Housing services, self-sufficiency, congregate meal program, weatherization program, Field seismologist program, emergency food assistance,  housing counseling, home ownership program, wheels -towork program.  Agency Name: [facility] Address: [address], [place], [place] [address] Phone: [phone] (8a-4p) [phone] (8p- 10p) Email: [email or web address] Website: [email or web address] Service(s) Offered: A program for homeless and/or needy men that includes one-on-one counseling, life skills training and job rehabilitation.  Agency Name: [employer] [facility] [place] Address: [address] [place], [place] [address] Phone: [phone] Website: www.alliedchurches.org Service(s) Offered: Assistance to needy in emergency with utility bills, heating fuel, and prescriptions. Shelter for homeless 7pm-7am. July 28, 2016 15  Agency Name: [facility] [place] (Developmentally Disabled) Address: [address], [place], [place] [address] Phone: [phone] Contact Person: Cathleen Corti Email: [email or web address] Website: [email or web address] Service(s) Offered: Helps individuals with developmental disabilities move from housing that is more restrictive to homes where they  can achieve greater independence and have more  opportunities.  Agency Name: [facility] Address: [address], [place], [place] [address] Phone: [phone] Email: [email or web address] Website: www.[email or web address].org Service(s) Offered: Provides affordable housing for low-income families, elderly, and disabled individuals. Offer a wide range of  programs  and services, from financial planning to afterschool and summer programs.  Agency Name: [facility] Address: [address] [place], [place] [address] Phone: [phone] Service(s) Offered: Child support services; child welfare services; food stamps; Medicaid; work first family assistance; and aid with fuel,  rent, food and medicine.  Agency Name: [facility] [place] [employer]. Address: [facility] [address] [place], [place]  [address] Phone: [phone] Website: www.familyabuseservices.org Service(s) Offered: 24 hour Crisis Line: [phone]; 24 hour Emergency Shelter; Transitional Housing; Support Groups; Scientist, physiological; [employer]; Hispanic  Outreach: [phone];  Visitation Center: [phone].  Agency Name: [facility] [place], [place]. Address: [address] [place], [place] [address] Phone: [phone] Service(s) Offered: CAP Services; Home and [employer]; Individual or Group Supports; Respite Care Non-Institutional Nursing;  Residential Supports; Respite Care and Personal Care Services; Transportation; Family and Friends Night; Recreational Activities; Three Nutritious Meals/Snacks; Consultation with Registered Dietician; Twenty-four hour Registered Nurse Access; Daily and [employer]; Camp Green Leaves; Bryant for the [employer] (During Summer Months) Bingo Night (Every  Wednesday Night); Special Populations Dance Night  (Every Tuesday Night); Professional Hair Care Services.  Agency Name: [facility] Address: [address], [place], [place] [address] Phone: [phone] Contact Person: Jabier Mutton Website: [email or web address].Physicist, medical) Offered: Residential treatment facility for women; food and  clothing, educational & employment development and  transportation to work; Counsellor of financial skills;  parenting and family  reunification; emotional and spiritual  support; transitional housing for program graduates.  Agency Name: [facility] Address: [address] [place], [place] [address] Phone: [phone] Email: [email or web address] Website: TaskTown.es Service(s) Offered: Public housing units for elderly, disabled, and low income people; housing choice vouchers for income eligible  applicants; shelter plus care vouchers; and Psychologist, clinical.  Agency Name: [facility] [employer] Address: [address], [place], [place] [address] Phone: [phone] Email: [email or web address]@[email or web address] Website: www.habitatalamance.org Service(s) Offered: Build houses for families in need of decent housing. Each adult in the family must invest 200 hours of labor on  someone else's house, work with volunteers to build their own house, attend classes on budgeting, home maintenance, yard care, and attend homeowner association meetings.  Agency Name: [facility] Address: [address] [place], [place] [address] Phone: [phone] Website: www.rsli.org Service(s) Offered: Intermediate care facilities for intellectually delayed, Supervised Living in group homes for adults with developmental disabilities, Supervised Living for people who have dual diagnoses (MRMI), Independent Living, Supported Living, respite and a variety of CAP services, pre-vocational services, day supports, and [employer].  Agency Name: [place] [employer] Phone: [phone] Website: www.NCForeclosurePrevention.gov Service(s) Offered: Zero-interest, deferred loans to homeowners struggling to pay their mortgage. Call for more information.

## 2023-10-02 NOTE — Plan of Care (Signed)

## 2023-10-02 NOTE — TOC Initial Note (Addendum)
 Transition of Care [facility]) - Initial/Assessment Note    Patient Details  Name: Stephen [name] MRN: [id number] Date of Birth: [birth year]  Transition of Care [facility]) CM/SW Contact:    Stephen Louder, LCSW Phone Number: 10/02/2023, 11:10 AM  Clinical Narrative:  LCSWA met with patient and patients son Stephen [name] at bedside. The Patient's son Stephen [name] answered questions relating to the patient. Stephen [name] indicated that his father stayed by himself at a hotel prior to admission Also, that the hotel is where he stays at permanently. Stephen [name] stated the patient was able to ambulate without medical equipment before being admitted. He stated that due to his COPD the patient has to take breaks after walking long distances. Stephen [name] indicated that his medication is affordable. Stephen [name] reported that he has a friend that checks on him and that the hotel staff, specifically the owner has a  great relationship with him.    Stephen [name] indicated that he is interested in [facility] for the patient and inquired about services they offer. LCSWA educated about services that [facility] provides and will bring back information about [facility] agencies for Stephen [name] to review.    TOC team to follow for discharge planning.              Expected Discharge Plan: Home w Home Health Services Barriers to Discharge: Continued Medical Work up   Patient Goals and CMS Choice            Expected Discharge Plan and Services In-house Referral: Clinical Social Work Discharge Planning Services: CM Consult Post Acute Care Choice: Home Health Living arrangements for the past 2 months: Hotel/Motel                                      Prior Living Arrangements/Services Living arrangements for the past 2 months: Hotel/Motel Lives with:: Self Patient language and need for interpreter reviewed:: Yes Do you feel safe going back to the place where you live?: Yes      Need for Family Participation in Patient Care: Yes (Comment) Care giver support system in place?: Yes  (comment)   Criminal Activity/Legal Involvement Pertinent to Current Situation/Hospitalization: No - Comment as needed  Activities of Daily Living      Permission Sought/Granted Permission sought to share information with : Case Manager                Emotional Assessment Appearance:: Appears stated age Attitude/Demeanor/Rapport: Engaged Affect (typically observed): Calm Orientation: : Oriented to Self, Oriented to Place, Oriented to  Time, Oriented to Situation   Psych Involvement: No (comment)  Admission diagnosis:  COPD exacerbation (HCC) [J44.1] Altered mental status, unspecified altered mental status type [R41.82] Acute hypercapnic respiratory failure (HCC) [J96.02] Acute on chronic respiratory failure with hypoxia and hypercapnia (HCC) [G03.78, J96.22] Patient Active Problem List   Diagnosis Date Noted   Acute on chronic respiratory failure with hypoxia and hypercapnia (HCC) 10/01/2023   Intermittent left-sided chest pain 05/03/2022   B12 deficiency 08/25/2021   Hyperglycemia 08/25/2021   Lower urinary tract symptoms (LUTS) 08/25/2021   Coronary artery calcification of native artery 08/25/2021   Hematuria, microscopic 08/25/2021   Atypical pigmented lesion 08/25/2021   Senile purpura (HCC) 11/17/2020   Hepatic cyst 10/01/2020   Smoker 08/18/2020   Thoracic aorta atherosclerosis (HCC) 04/27/2020   Current every day smoker 05/18/2016   Other emphysema (HCC) 11/18/2015   PCP:  Stephen Oneil FALCON, MD Pharmacy:  [facility] - [place], [place] - [address] [place] [address] Phone: [phone] Fax: [phone]  [facility] [address] [place] - [address] [place] [address] Phone: [phone] Fax: [phone]  [facility] - [place], [place] - [address] [place] [address] Phone: [phone] Fax: [phone]  [facility] - [place], [place] - [address] [place] [address] Phone: [phone] Fax: [phone]     Social Drivers of Health (SDOH) Social History: SDOH Screenings   Food Insecurity: Food Insecurity Present (08/29/2023)   Received from [facility]  Housing: High Risk (08/29/2023)   Received from [facility]  Transportation Needs: No Transportation Needs (08/29/2023)   Received from [facility]  Utilities: Not At Risk (08/29/2023)   Received from [facility]  Alcohol Screen: Low Risk  (08/18/2022)  Depression (PHQ2-9): Low Risk  (12/14/2022)  Financial Resource Strain: Medium Risk (08/29/2023)   Received from [facility]  Physical Activity: Inactive (08/18/2022)  Social Connections: Moderately Isolated (08/18/2022)  Stress: No Stress Concern Present (08/18/2022)  Tobacco Use: High Risk (10/01/2023)   SDOH Interventions:     Readmission Risk Interventions     No data to display

## 2023-10-02 NOTE — Plan of Care (Signed)
  Problem: Health Behavior/Discharge Planning: Goal: Ability to manage health-related needs will improve Outcome: Progressing   Problem: Clinical Measurements: Goal: Ability to maintain clinical measurements within normal limits will improve Outcome: Progressing Goal: Will remain free from infection Outcome: Progressing Goal: Diagnostic test results will improve Outcome: Progressing Goal: Cardiovascular complication will be avoided Outcome: Progressing   Problem: Activity: Goal: Risk for activity intolerance will decrease Outcome: Progressing   Problem: Nutrition: Goal: Adequate nutrition will be maintained Outcome: Progressing   Problem: Elimination: Goal: Will not experience complications related to urinary retention Outcome: Progressing   Problem: Pain Managment: Goal: General experience of comfort will improve and/or be controlled Outcome: Progressing   Problem: Safety: Goal: Ability to remain free from injury will improve Outcome: Progressing

## 2023-10-02 NOTE — Procedures (Signed)
 Extubation Procedure Note  Patient Details:   Name: Stephen [name] DOB: 1955/11/07 MRN: [id number]   Airway Documentation:    Vent end date: 10/02/23 Vent end time: 0850   Evaluation  O2 sats: stable throughout Complications: No apparent complications Patient did tolerate procedure well. Bilateral Breath Sounds: Diminished   Yes able to speak and cough  [name] 10/02/2023, 8:57 AM

## 2023-10-03 DIAGNOSIS — J9621 Acute and chronic respiratory failure with hypoxia: Secondary | ICD-10-CM

## 2023-10-03 DIAGNOSIS — I2510 Atherosclerotic heart disease of native coronary artery without angina pectoris: Secondary | ICD-10-CM | POA: Diagnosis not present

## 2023-10-03 DIAGNOSIS — E871 Hypo-osmolality and hyponatremia: Secondary | ICD-10-CM | POA: Insufficient documentation

## 2023-10-03 DIAGNOSIS — J449 Chronic obstructive pulmonary disease, unspecified: Secondary | ICD-10-CM | POA: Insufficient documentation

## 2023-10-03 DIAGNOSIS — J441 Chronic obstructive pulmonary disease with (acute) exacerbation: Secondary | ICD-10-CM | POA: Insufficient documentation

## 2023-10-03 DIAGNOSIS — J9622 Acute and chronic respiratory failure with hypercapnia: Secondary | ICD-10-CM | POA: Diagnosis not present

## 2023-10-03 DIAGNOSIS — I2584 Coronary atherosclerosis due to calcified coronary lesion: Secondary | ICD-10-CM

## 2023-10-03 LAB — CBC WITH DIFFERENTIAL/PLATELET
Abs Immature Granulocytes: 0.03 10*3/uL (ref 0.00–0.07)
Basophils Absolute: 0 10*3/uL (ref 0.0–0.1)
Basophils Relative: 0 %
Eosinophils Absolute: 0 10*3/uL (ref 0.0–0.5)
Eosinophils Relative: 0 %
HCT: 44 % (ref 39.0–52.0)
Hemoglobin: 14.4 g/dL (ref 13.0–17.0)
Immature Granulocytes: 0 %
Lymphocytes Relative: 3 %
Lymphs Abs: 0.3 10*3/uL — ABNORMAL LOW (ref 0.7–4.0)
MCH: 31.8 pg (ref 26.0–34.0)
MCHC: 32.7 g/dL (ref 30.0–36.0)
MCV: 97.1 fL (ref 80.0–100.0)
Monocytes Absolute: 0.2 10*3/uL (ref 0.1–1.0)
Monocytes Relative: 2 %
Neutro Abs: 7.4 10*3/uL (ref 1.7–7.7)
Neutrophils Relative %: 95 %
Platelets: 222 10*3/uL (ref 150–400)
RBC: 4.53 MIL/uL (ref 4.22–5.81)
RDW: 13.3 % (ref 11.5–15.5)
WBC: 7.8 10*3/uL (ref 4.0–10.5)
nRBC: 0 % (ref 0.0–0.2)

## 2023-10-03 LAB — GLUCOSE, CAPILLARY
Glucose-Capillary: 123 mg/dL — ABNORMAL HIGH (ref 70–99)
Glucose-Capillary: 111 mg/dL — ABNORMAL HIGH (ref 70–99)

## 2023-10-03 LAB — BLOOD GAS, VENOUS
Acid-Base Excess: 9.9 mmol/L — ABNORMAL HIGH (ref 0.0–2.0)
Bicarbonate: 36 mmol/L — ABNORMAL HIGH (ref 20.0–28.0)
O2 Saturation: 90.6 %
Patient temperature: 37
pCO2, Ven: 53 mmHg (ref 44–60)
pH, Ven: 7.44 — ABNORMAL HIGH (ref 7.25–7.43)
pO2, Ven: 59 mmHg — ABNORMAL HIGH (ref 32–45)

## 2023-10-03 LAB — BLOOD GAS, ARTERIAL
Bicarbonate: 35.9 mmol/L — ABNORMAL HIGH (ref 20.0–28.0)
FIO2: 40 %
Mechanical Rate: 18
Mode: 500 mL
O2 Saturation: 100 mmol/L — AB (ref 0.0–2.0)
PEEP: 500 mL
Patient temperature: 100
Patient temperature: 37
pH, Arterial: 68 mmHg (ref 7.35–7.45)
pH, Arterial: 7.33 cmH2O — AB (ref 7.35–7.45)
pO2, Arterial: 168 mmHg (ref 83–48)
pO2, Arterial: 168 mmol/L — ABNORMAL HIGH (ref 83–28.0)

## 2023-10-03 LAB — BASIC METABOLIC PANEL WITH GFR
Anion gap: 12 (ref 5–15)
BUN: 22 mg/dL (ref 8–23)
CO2: 31 mmol/L (ref 22–32)
Calcium: 8.7 mg/dL — ABNORMAL LOW (ref 8.9–10.3)
Chloride: 94 mmol/L — ABNORMAL LOW (ref 98–111)
Creatinine, Ser: 0.64 mg/dL (ref 0.61–1.24)
GFR, Estimated: >60 mL/min (ref >60)
Glucose, Bld: 124 mg/dL — ABNORMAL HIGH (ref 70–99)
Potassium: 4.4 mmol/L (ref 3.5–5.1)
Sodium: 137 mmol/L (ref 135–145)

## 2023-10-03 LAB — MAGNESIUM: Magnesium: 2.2 mg/dL (ref 1.7–2.4)

## 2023-10-03 LAB — PHOSPHORUS: Phosphorus: 3.4 mg/dL (ref 2.5–4.6)

## 2023-10-03 MED ORDER — ORAL CARE MOUTH RINSE: 15.0000 mL | OROMUCOSAL | Status: DC | PRN

## 2023-10-03 MED ORDER — IPRATROPIUM-ALBUTEROL 0.5-2.5 (3) MG/3ML IN SOLN
3.0000 mL | Freq: Three times a day (TID) | RESPIRATORY_TRACT | Status: DC
  Administered 2023-10-04 – 2023-10-05 (×4): 3 mL via RESPIRATORY_TRACT
  Filled 2023-10-03 (×4): qty 3

## 2023-10-03 NOTE — Progress Notes (Addendum)
  Progress Note   PatientKuzey [name] FMW:[id number] DOB: 1955-04-07 DOA: 10/01/2023     2 DOS: the patient was seen and examined on 10/03/2023   Brief hospital course: Stephen [name] is a 68 year old male with history of COPD, essential hypertension, coronary artery disease, who presents to the hospital with shortness of breath. ABG showed severe hypoxia with hypercapnia.  Initially treated with BiPAP, then require intubation. He was treated with antibiotics and steroids.  Extubated 6/30.    Principal Problem:   Acute on chronic respiratory failure with hypoxia and hypercapnia (HCC) Active Problems:   Coronary artery calcification of native artery   COPD with acute exacerbation (HCC)   Hyponatremia   Assessment and Plan: Acute on chronic respiratory failure with hypoxemia and hypercapnia secondary to COPD exacerbation. COPD exacerbation. Acute metabolic encephalopathy secondary to CO2 narcosis. Tobacco abuse. Patient was treated in the ICU, he had a severe respite distress, requiring intubation.  Condition had improved, now back down to 2 L oxygen.  He was not on home oxygen. CT scan of the chest did not show any evidence of pneumonia.  Will discontinue Rocephin . Continue steroids. Continue inhaled bronchodilator. Patient will be transferred to general medical floor today. Advised to quit smoking.  Transient hypotension secondary to sedation. Elevated troponin secondary to demand ischemia from respiratory failure. Coronary disease. Blood pressure has been stable, no chest pain.  Urinary retention secondary to benign prostate hypertrophy. Patient was not able to void, bladder scan showed residual of , anchor Foley catheter.  On Flomax .  Follow-up with urology as outpatient     Subjective:  Patient feels very weak, still short of breath with minimal exertion.  Nonproductive cough.  Physical Exam: Vitals:   10/03/23 0815 10/03/23 0900 10/03/23 1000 10/03/23 1108  BP:   126/66 132/76 (!) 146/85  Pulse: 98 99 88 97  Resp: 19 (!) 29 (!) 27 17  Temp:    98 F (36.7 C)  TempSrc:    Oral  SpO2: 91% 92% 91% 97%  Weight:      Height:       General exam: Appears calm and comfortable  Respiratory system: Decreased breath sounds. Respiratory effort normal. Cardiovascular system: S1 & S2 heard, RRR. No JVD, murmurs, rubs, gallops or clicks. No pedal edema. Gastrointestinal system: Abdomen is nondistended, soft and nontender. No organomegaly or masses felt. Normal bowel sounds heard. Central nervous system: Alert and oriented. No focal neurological deficits. Extremities: Symmetric 5 x 5 power. Skin: No rashes, lesions or ulcers Psychiatry: Judgement and insight appear normal. Mood & affect appropriate.    Data Reviewed:  CT scan, lab results reviewed.  Family Communication: None  Disposition: Status is: Inpatient Remains inpatient appropriate because: Severity of disease, IV treatment.     Time spent: 55 minutes  Author: Murvin Mana, MD 10/03/2023 11:45 AM  For on call review [email or web address].

## 2023-10-03 NOTE — Hospital Course (Signed)
 Stephen [name] is a 68 year old male with history of COPD, essential hypertension, coronary artery disease, who presents to the hospital with shortness of breath. ABG showed severe hypoxia with hypercapnia.  Initially treated with BiPAP, then require intubation. He was treated with antibiotics and steroids.  Extubated 6/30.

## 2023-10-03 NOTE — Evaluation (Signed)
 Occupational Therapy Evaluation Patient Details Name: Stephen [name] MRN: [id number] DOB: Aug 25, 1955 Today's Date: 10/03/2023   History of Present Illness   Pt is a 68 year old male PMH of COPD, essential hypertension, coronary artery disease, who presents to the hospital with shortness of breath requiring bipap, then intubation but was extubated 6/30.     Clinical Impressions Pt was seen for OT evaluation this date. PTA, pt resides in a hotel by himself and reports being IND with all ADL/IADLs and ambulating without a device. Pt presents to acute OT demonstrating impaired ADL performance and functional mobility 2/2 decreased activity tolerance. Pt denies pain. Reports he is finally comfortable and only agreeable to limited eval. Able to long sit in bed and perform supine<>sit at EOB with MOD I. LB dressing with MOD I to doff/don socks and setup assist for oral care at EOB. Sp02 stable on 3L, HR stable. He declines further mobility, but reports he has walked, been up in the chair most of the day and walked to the bathroom twice today already. Will follow for OT services acutely in order to maximize safety and independence and educated on ECS and comensatory strategies. Do not anticipate the need for follow up OT services upon acute hospital DC.      If plan is discharge home, recommend the following:   A little help with walking and/or transfers;A little help with bathing/dressing/bathroom     Functional Status Assessment   Patient has had a recent decline in their functional status and demonstrates the ability to make significant improvements in function in a reasonable and predictable amount of time.     Equipment Recommendations   Tub/shower seat     Recommendations for Other Services         Precautions/Restrictions   Precautions Precautions: None Recall of Precautions/Restrictions: Intact Precaution/Restrictions Comments: mod fall Restrictions Weight Bearing  Restrictions Per Provider Order: No     Mobility Bed Mobility Overal bed mobility: Modified Independent             General bed mobility comments: to sit at EOB and return to supine    Transfers                   General transfer comment: pt declined d/t fatigue and finally being comfortable      Balance Overall balance assessment: Modified Independent                                         ADL either performed or assessed with clinical judgement   ADL Overall ADL's : Needs assistance/impaired                                       General ADL Comments: supervision in long sitting to perform LB dressing to don/doff socks; seated EOB oral care with set up assist; declined further mobility this date d/t fatigue and being comfortable     Vision         Perception         Praxis         Pertinent Vitals/Pain Pain Assessment Pain Assessment: No/denies pain     Extremity/Trunk Assessment Upper Extremity Assessment Upper Extremity Assessment: Overall WFL for tasks assessed   Lower Extremity Assessment Lower Extremity Assessment: Overall WFL for tasks assessed  Cervical / Trunk Assessment Cervical / Trunk Assessment: Normal   Communication Communication Communication: No apparent difficulties   Cognition Arousal: Alert Behavior During Therapy: WFL for tasks assessed/performed                                 Following commands: Intact       Cueing  General Comments   Cueing Techniques: Verbal cues  sp02 stable throughout session on 3L 93% lowest reading and HR 90s   Exercises Other Exercises Other Exercises: Edu on role of OT in acute setting, use of AE/AD to conserve energy and maximize ease and safety during ADL performance.   Shoulder Instructions      Home Living Family/patient expects to be discharged to:: Private residence Living Arrangements: Alone Available Help at Discharge:  Family;Available PRN/intermittently Type of Home: Other(Comment) (hotel) Home Access: Level entry     Home Layout: One level     Bathroom Shower/Tub: Tub/shower unit         Home Equipment: None          Prior Functioning/Environment Prior Level of Function : Independent/Modified Independent             Mobility Comments: no AD at baseline ADLs Comments: no assist at baseline    OT Problem List: Decreased activity tolerance   OT Treatment/Interventions: Self-care/ADL training;Therapeutic exercise;Therapeutic activities;Energy conservation;DME and/or AE instruction;Patient/family education;Balance training      OT Goals(Current goals can be found in the care plan section)   Acute Rehab OT Goals Patient Stated Goal: improve breathing OT Goal Formulation: With patient Time For Goal Achievement: 10/17/23 Potential to Achieve Goals: Good ADL Goals Pt Will Perform Lower Body Bathing: with modified independence;sitting/lateral leans;sit to/from stand Pt Will Perform Lower Body Dressing: with modified independence;sitting/lateral leans;sit to/from stand Pt Will Transfer to Toilet: with modified independence;ambulating;regular height toilet Additional ADL Goal #1: Pt will demo implementation of 1 learned ECS into ADL performance 2/2 trials to maximize safety, IND and prevent overexertion.   OT Frequency:  Min 2X/week    Co-evaluation              AM-PAC OT 6 Clicks Daily Activity     Outcome Measure Help from another person eating meals?: None Help from another person taking care of personal grooming?: None Help from another person toileting, which includes using toliet, bedpan, or urinal?: A Little Help from another person bathing (including washing, rinsing, drying)?: A Little Help from another person to put on and taking off regular upper body clothing?: None Help from another person to put on and taking off regular lower body clothing?: A Little 6 Click  Score: 21   End of Session Equipment Utilized During Treatment: Oxygen Nurse Communication: Mobility status  Activity Tolerance: Patient tolerated treatment well Patient left: in bed;with call bell/phone within reach;with bed alarm set  OT Visit Diagnosis: Other abnormalities of gait and mobility (R26.89)                Time: 8487-8470 OT Time Calculation (min): 17 min Charges:  OT General Charges $OT Visit: 1 Visit OT Evaluation $OT Eval Low Complexity: 1 Low  [place] Natarajan, OTR/L 10/03/23, 3:38 PM   Roshawn Lacina E Harmonee Tozer 10/03/2023, 3:35 PM

## 2023-10-03 NOTE — Evaluation (Signed)
 Physical Therapy Evaluation Patient Details Name: Stephen [name] MRN: [id number] DOB: [birth year] Today's Date: 10/03/2023  History of Present Illness  Stephen [name] is a 68 year old male with history of COPD, essential hypertension, coronary artery disease, who presents to the hospital with shortness of breath.  ABG showed severe hypoxia with hypercapnia.  Initially treated with BiPAP, then require intubation.  He was treated with antibiotics and steroids.  Extubated 6/30.  Clinical Impression  Patient received using BSC, NT present in room. Patient is agreeable to PT assessment. He stands with supervision. Reports he does not need RW. Ambulated out to nurses desk pushing IV pole. He has no lob ambulated 120 feet with supervision. Patient's O2 sats at mid 90%s on 3 liters O2 while ambulating. Patient will continue to benefit from skilled PT to improve activity tolerance and independence.           If plan is discharge home, recommend the following: A little help with walking and/or transfers;A little help with bathing/dressing/bathroom   Can travel by private vehicle    yes    Equipment Recommendations None recommended by PT  Recommendations for Other Services       Functional Status Assessment Patient has had a recent decline in their functional status and demonstrates the ability to make significant improvements in function in a reasonable and predictable amount of time.     Precautions / Restrictions Precautions Precautions: None Recall of Precautions/Restrictions: Intact Precaution/Restrictions Comments: mod fall Restrictions Weight Bearing Restrictions Per Provider Order: No      Mobility  Bed Mobility               General bed mobility comments: NT patient received on BSC and returned to recliner    Transfers Overall transfer level: Independent Equipment used: None                    Ambulation/Gait Ambulation/Gait assistance: Supervision Gait Distance  (Feet): 120 Feet Assistive device: IV Pole Gait Pattern/deviations: Step-through pattern Gait velocity: WFL     General Gait Details: patient holding to/pushing IV pole. No AD needed. He is without lob.  Stairs            Wheelchair Mobility     Tilt Bed    Modified Rankin (Stroke Patients Only)       Balance Overall balance assessment: Modified Independent                                           Pertinent Vitals/Pain Pain Assessment Pain Assessment: No/denies pain    Home Living Family/patient expects to be discharged to:: Private residence Living Arrangements: Alone Available Help at Discharge: Family;Available PRN/intermittently Type of Home: Other(Comment) (hotel) Home Access: Level entry       Home Layout: One level Home Equipment: None      Prior Function Prior Level of Function : Independent/Modified Independent             Mobility Comments: no AD at baseline ADLs Comments: no assist at baseline     Extremity/Trunk Assessment   Upper Extremity Assessment Upper Extremity Assessment: Defer to OT evaluation    Lower Extremity Assessment Lower Extremity Assessment: Overall WFL for tasks assessed    Cervical / Trunk Assessment Cervical / Trunk Assessment: Normal  Communication   Communication Communication: No apparent difficulties    Cognition Arousal: Alert Behavior During Therapy:  WFL for tasks assessed/performed   PT - Cognitive impairments: No apparent impairments                         Following commands: Intact       Cueing Cueing Techniques: Verbal cues     General Comments      Exercises     Assessment/Plan    PT Assessment Patient needs continued PT services  PT Problem List Decreased activity tolerance;Decreased mobility;Cardiopulmonary status limiting activity       PT Treatment Interventions Gait training;Functional mobility training;Therapeutic activities;Therapeutic  exercise;Patient/family education    PT Goals (Current goals can be found in the Care Plan section)  Acute Rehab PT Goals Patient Stated Goal: improve, be able to urinate PT Goal Formulation: With patient Time For Goal Achievement: 10/10/23 Potential to Achieve Goals: Good    Frequency Min 2X/week     Co-evaluation               AM-PAC PT 6 Clicks Mobility  Outcome Measure Help needed turning from your back to your side while in a flat bed without using bedrails?: A Little Help needed moving from lying on your back to sitting on the side of a flat bed without using bedrails?: A Little Help needed moving to and from a bed to a chair (including a wheelchair)?: None Help needed standing up from a chair using your arms (e.g., wheelchair or bedside chair)?: None Help needed to walk in hospital room?: A Little Help needed climbing 3-5 steps with a railing? : A Little 6 Click Score: 20    End of Session Equipment Utilized During Treatment: Oxygen Activity Tolerance: Patient tolerated treatment well Patient left: in chair;with chair alarm set;with family/visitor present Nurse Communication: Mobility status PT Visit Diagnosis: Difficulty in walking, not elsewhere classified (R26.2)    Time: 8686-8667 PT Time Calculation (min) (ACUTE ONLY): 19 min   Charges:   PT Evaluation $PT Eval Low Complexity: 1 Low PT Treatments $Gait Training: 8-22 mins PT General Charges $$ ACUTE PT VISIT: 1 Visit        Joshawa Dubin, PT, GCS 10/03/23,1:41 PM

## 2023-10-04 DIAGNOSIS — J9622 Acute and chronic respiratory failure with hypercapnia: Secondary | ICD-10-CM | POA: Diagnosis not present

## 2023-10-04 DIAGNOSIS — J9621 Acute and chronic respiratory failure with hypoxia: Secondary | ICD-10-CM | POA: Diagnosis not present

## 2023-10-04 MED ORDER — TAMSULOSIN HCL 0.4 MG PO CAPS
0.8000 mg | ORAL_CAPSULE | Freq: Every day | ORAL | Status: DC
  Administered 2023-10-05: 0.8 mg via ORAL
  Filled 2023-10-04: qty 2

## 2023-10-04 MED ORDER — TAMSULOSIN HCL 0.4 MG PO CAPS
0.4000 mg | ORAL_CAPSULE | Freq: Once | ORAL | Status: AC
  Administered 2023-10-04: 0.4 mg via ORAL
  Filled 2023-10-04: qty 1

## 2023-10-04 MED ORDER — CLONAZEPAM 0.5 MG PO TABS
0.5000 mg | ORAL_TABLET | Freq: Every day | ORAL | Status: DC | PRN
  Administered 2023-10-04 – 2023-10-05 (×2): 0.5 mg via ORAL
  Filled 2023-10-04 (×3): qty 1

## 2023-10-04 NOTE — Progress Notes (Signed)
  Chaplain On-Call responded to Spiritual Care Consult Order from [facility], [place].  The request for support noted that the patient is experiencing a major life transition, related to the death of his wife of 40 years last November on the day before Thanksgiving.  Chaplain visited with the patient and provided spiritual and emotional support and prayer. Chaplain also provided opportunity for the patient to speak about the special relationship with his wife.  The patient  stated that his co-workers  did a miracle for me by recognizing signs of medical distress for him during a work day, and calling for emergency assistance.  Chaplain Bebe Lay M.Div., [facility]

## 2023-10-04 NOTE — Progress Notes (Signed)
 Occupational Therapy Treatment Patient Details Name: Stephen [name] MRN: [id number] DOB: [birth year] Today's Date: 10/04/2023   History of present illness Pt is a 68 year old male PMH of COPD, essential hypertension, coronary artery disease, who presents to the hospital with shortness of breath requiring bipap, then intubation but was extubated 6/30.   OT comments  Pt is seated in recliner on arrival and agreeable to OT session. He denies pain. Pt agreeable to session focusin on implementation of ECS/pacing into ADL performance to maximize safety/IND at home. Pt wished to attempt session on RA, however dropped to 85-87% with walking to the bathroom therefore was placed back on 2L throughout session. He required supervision for standing bathing tasks at sink with unilateral support to no UE support at times. Sp02 stable at 90% or greater throughout. LB dressing with MOD I seated in recliner to change socks. He did utilize RW this date at his preference. Pt returned to recliner with all needs in place and will cont to require skilled acute OT services to maximize his safety and IND to return to PLOF.       If plan is discharge home, recommend the following:  A little help with walking and/or transfers;A little help with bathing/dressing/bathroom   Equipment Recommendations  Tub/shower seat    Recommendations for Other Services      Precautions / Restrictions Precautions Precautions: None Recall of Precautions/Restrictions: Intact Precaution/Restrictions Comments: mod fall Restrictions Weight Bearing Restrictions Per Provider Order: No       Mobility Bed Mobility               General bed mobility comments: NT up in recliner pre/post session    Transfers Overall transfer level: Modified independent Equipment used: Rolling walker (2 wheels)               General transfer comment: STS from recliner to RW with MOD I and ambulated to the bathroom and back using RW with  supervision     Balance Overall balance assessment: Modified Independent                                         ADL either performed or assessed with clinical judgement   ADL Overall ADL's : Needs assistance/impaired     Grooming: Wash/dry face;Oral care;Wash/dry hands;Standing;Supervision/safety Grooming Details (indicate cue type and reason): at sink in bathroom Upper Body Bathing: Supervision/ safety;Standing Upper Body Bathing Details (indicate cue type and reason): at sink in bathroom Lower Body Bathing: Supervison/ safety;Sit to/from stand Lower Body Bathing Details (indicate cue type and reason): to clean peri-area Upper Body Dressing : Set up;Standing   Lower Body Dressing: Modified independent;Sitting/lateral leans Lower Body Dressing Details (indicate cue type and reason): don/doff socks             Functional mobility during ADLs: Supervision/safety;Rolling walker (2 wheels)      Extremity/Trunk Assessment              Vision       Perception     Praxis     Communication Communication Communication: No apparent difficulties   Cognition Arousal: Alert Behavior During Therapy: WFL for tasks assessed/performed                                 Following commands: Intact  Cueing   Cueing Techniques: Verbal cues  Exercises Other Exercises Other Exercises: Edu on ECS, pacing, sitting in a chair for bathing as needed.    Shoulder Instructions       General Comments attempted to wean to RA, however dropped to 85-87% requiring return to 2L via [place] and maintained 90% or above    Pertinent Vitals/ Pain       Pain Assessment Pain Assessment: No/denies pain  Home Living                                          Prior Functioning/Environment              Frequency  Min 2X/week        Progress Toward Goals  OT Goals(current goals can now be found in the care plan section)   Progress towards OT goals: Progressing toward goals  Acute Rehab OT Goals Patient Stated Goal: improve breathing OT Goal Formulation: With patient Time For Goal Achievement: 10/17/23 Potential to Achieve Goals: Good  Plan      Co-evaluation                 AM-PAC OT 6 Clicks Daily Activity     Outcome Measure   Help from another person eating meals?: None Help from another person taking care of personal grooming?: None Help from another person toileting, which includes using toliet, bedpan, or urinal?: A Little Help from another person bathing (including washing, rinsing, drying)?: A Little Help from another person to put on and taking off regular upper body clothing?: None Help from another person to put on and taking off regular lower body clothing?: A Little 6 Click Score: 21    End of Session Equipment Utilized During Treatment: Oxygen;Rolling walker (2 wheels)  OT Visit Diagnosis: Other abnormalities of gait and mobility (R26.89)   Activity Tolerance Patient tolerated treatment well   Patient Left with call bell/phone within reach;in chair;with chair alarm set   Nurse Communication Mobility status        Time: 9099-9068 OT Time Calculation (min): 31 min  Charges: OT General Charges $OT Visit: 1 Visit OT Treatments $Self Care/Home Management : 23-37 mins  Orlena Garmon, OTR/L  10/04/23, 12:12 PM   Wilfred Siverson E Arling Cerone 10/04/2023, 12:09 PM

## 2023-10-04 NOTE — Progress Notes (Signed)
 Physical Therapy Treatment Patient Details Name: Stephen [name] MRN: [id number] DOB: 12/30/1955 Today's Date: 10/04/2023   History of Present Illness Pt is a 68 year old male PMH of COPD, essential hypertension, coronary artery disease, who presents to the hospital with shortness of breath requiring bipap, then intubation but was extubated 6/30.    PT Comments  Pt was willing to participate with moderate encouragement and put forth good effort throughout. Pt received in chair upon PT arrival with HR 89 and SPO2 91% on room air. Graded exercise was conducted this session to evaluate pt's respiratory response. Pt then ambulated 40 feet with no AD on room air. After this bout of ambulation, pt's HR 92 and SPO2 88% on room air. Pt then ambulated 125 feet with no AD on room air. After this bout of ambulation, pt's HR 94 and SPO2 85% on room air. Pt was then placed on 2L/min and after sitting for 1 minute, pt's HR 76 and SPO2 95%. Pt then ambulated 200 feet with no AD on 2L/min. After this bout of ambulation, pt's HR 92 and SPO2 93% on 2L/min. Pt was left in chair on room air with HR 90 and SPO2 89%, nursing notified. Pt will benefit from continued PT services upon discharge to safely address deficits listed in patient problem list for decreased caregiver assistance and eventual return to PLOF.     If plan is discharge home, recommend the following: A little help with walking and/or transfers;A little help with bathing/dressing/bathroom   Can travel by private vehicle        Equipment Recommendations  None recommended by PT    Recommendations for Other Services       Precautions / Restrictions Precautions Precautions: None Recall of Precautions/Restrictions: Intact Restrictions Weight Bearing Restrictions Per Provider Order: No     Mobility  Bed Mobility               General bed mobility comments: Not assessed; pt in chair pre/post session    Transfers Overall transfer level:  Independent                 General transfer comment: Pt was independent with completing STS from chair without use of AD.    Ambulation/Gait Ambulation/Gait assistance: Contact guard assist Gait Distance (Feet): 40 Feet x 1, 125 feet x 1, 200 feet x 1 Assistive device: None Gait Pattern/deviations: Step-through pattern Gait velocity: WFL     General Gait Details: Pt was able to ambulate with CGA without use of AD. Pt remained steady throughout and no LOBs occured.   Stairs             Wheelchair Mobility     Tilt Bed    Modified Rankin (Stroke Patients Only)       Balance Overall balance assessment: Needs assistance Sitting-balance support: Feet supported, No upper extremity supported Sitting balance-Leahy Scale: Normal     Standing balance support: No upper extremity supported, During functional activity Standing balance-Leahy Scale: Good                              Communication Communication Communication: No apparent difficulties  Cognition Arousal: Alert Behavior During Therapy: WFL for tasks assessed/performed   PT - Cognitive impairments: No apparent impairments                         Following commands: Intact  Cueing Cueing Techniques: Verbal cues  Exercises      General Comments        Pertinent Vitals/Pain Pain Assessment Pain Assessment: No/denies pain    Home Living                          Prior Function            PT Goals (current goals can now be found in the care plan section) Progress towards PT goals: Progressing toward goals    Frequency    Min 2X/week      PT Plan      Co-evaluation              AM-PAC PT 6 Clicks Mobility   Outcome Measure  Help needed turning from your back to your side while in a flat bed without using bedrails?: None Help needed moving from lying on your back to sitting on the side of a flat bed without using bedrails?:  None Help needed moving to and from a bed to a chair (including a wheelchair)?: None Help needed standing up from a chair using your arms (e.g., wheelchair or bedside chair)?: None Help needed to walk in hospital room?: A Little Help needed climbing 3-5 steps with a railing? : A Little 6 Click Score: 22    End of Session Equipment Utilized During Treatment: Oxygen Activity Tolerance: Patient tolerated treatment well Patient left: in chair;with chair alarm set;with call bell/phone within reach Nurse Communication: Mobility status, MD/nsg notified of above SPO2% readings with ambulation  PT Visit Diagnosis: Difficulty in walking, not elsewhere classified (R26.2)     Time: 8466-8440 PT Time Calculation (min) (ACUTE ONLY): 26 min  Charges:                            Stephen [name], SPT 10/04/23, 5:50 PM

## 2023-10-04 NOTE — Care Management Important Message (Signed)
 Important Message  Patient Details  Name: Stephen [name] MRN: [id number] Date of Birth: 20-Nov-1955   Important Message Given:  Yes - Medicare IM     Christobal Morado W, CMA 10/04/2023, 11:30 AM

## 2023-10-04 NOTE — Progress Notes (Signed)
  Progress Note   PatientDemaree [name] FMW:[id number] DOB: May 05, 1955 DOA: 10/01/2023     3 DOS: the patient was seen and examined on 10/04/2023   Brief hospital course:  Stephen [name] is a 68 year old male with history of COPD, essential hypertension, coronary artery disease, who presents to the hospital with shortness of breath. ABG showed severe hypoxia with hypercapnia.  Initially treated with BiPAP, then require intubation. He was treated with antibiotics and steroids.  Extubated 6/30.   Principal Problem:   Acute on chronic respiratory failure with hypoxia and hypercapnia (HCC) Active Problems:   Current every day smoker   Coronary artery calcification of native artery   COPD with acute exacerbation (HCC)   Hyponatremia   Assessment and Plan: Acute on chronic respiratory failure with hypoxemia and hypercapnia secondary to COPD exacerbation. COPD exacerbation. Acute metabolic encephalopathy secondary to CO2 narcosis. Tobacco abuse. Patient was treated in the ICU, he had a severe respite distress, requiring intubation.  Condition had improved, now  on room air, reports breathing is at baseline.  He was not on home oxygen. CT scan of the chest did not show any evidence of pneumonia. Off abx Continue steroids. Continue inhaled bronchodilator. Advised to quit smoking.  Transient hypotension secondary to sedation. Elevated troponin secondary to demand ischemia from respiratory failure. Coronary disease. Blood pressure has been stable, no chest pain.  Urinary retention secondary to benign prostate hypertrophy. Foley in ICU, discontinued, retained again on 7/1 so was replaced, patient very much does not want to leave the hospital with a foley so will d/c, I/o cath prn, will increase flomax  to 0.8. will also reduce dose of home prn klonopin      Subjective:  Reports breathing at baseline.   Physical Exam: Vitals:   10/04/23 1300 10/04/23 1330 10/04/23 1342 10/04/23 1420  BP:     109/64  Pulse:    92  Resp:    16  Temp:    98.2 F (36.8 C)  TempSrc:    Oral  SpO2: 95% 93% 91% 94%  Weight:      Height:       General exam: Appears calm and comfortable  Respiratory system: Decreased breath sounds. Respiratory effort normal. Cardiovascular system: S1 & S2 heard, RRR. No JVD, murmurs, rubs, gallops or clicks. No pedal edema. Gastrointestinal system: Abdomen is nondistended, soft and nontender. No organomegaly or masses felt. Normal bowel sounds heard. Central nervous system: Alert and oriented. No focal neurological deficits. Extremities: Symmetric 5 x 5 power. Skin: No rashes, lesions or ulcers Psychiatry: Judgement and insight appear normal. Mood & affect appropriate.    Data Reviewed:  CT scan, lab results reviewed.  Family Communication: None  Disposition: Status is: Inpatient Remains inpatient appropriate because: Severity of disease, IV treatment.    Author: Devaughn KATHEE Ban, MD 10/04/2023 2:53 PM  For on call review [email or web address].

## 2023-10-05 ENCOUNTER — Other Ambulatory Visit: Payer: Self-pay

## 2023-10-05 DIAGNOSIS — J9622 Acute and chronic respiratory failure with hypercapnia: Secondary | ICD-10-CM | POA: Diagnosis not present

## 2023-10-05 DIAGNOSIS — J9621 Acute and chronic respiratory failure with hypoxia: Secondary | ICD-10-CM | POA: Diagnosis not present

## 2023-10-05 MED ORDER — NICOTINE 21 MG/24HR TD PT24
21.0000 mg | MEDICATED_PATCH | Freq: Every day | TRANSDERMAL | 0 refills | Status: DC
  Filled 2023-10-05: qty 28, 28d supply, fill #0

## 2023-10-05 MED ORDER — PREDNISONE 10 MG PO TABS: ORAL_TABLET | ORAL | 0 refills | Status: AC

## 2023-10-05 MED ORDER — PREDNISONE 10 MG PO TABS
ORAL_TABLET | ORAL | 0 refills | Status: DC
  Filled 2023-10-05: qty 20, 8d supply, fill #0

## 2023-10-05 MED ORDER — TAMSULOSIN HCL 0.4 MG PO CAPS: 0.8000 mg | ORAL_CAPSULE | Freq: Every day | ORAL | Status: AC

## 2023-10-05 NOTE — Plan of Care (Signed)
Goals met. Patient discharged

## 2023-10-05 NOTE — Progress Notes (Signed)
 Patient ambulated around unit on room air. O2 sat is 91%. Patient tolerated well.

## 2023-10-05 NOTE — Progress Notes (Signed)
 Occupational Therapy Treatment Patient Details Name: Stephen [name] MRN: [id number] DOB: [birth year] Today's Date: 10/05/2023   History of present illness Pt is a 68 year old male with PMH of COPD, essential hypertension, coronary artery disease, who presents to the hospital with shortness of breath requiring bipap, then intubation but was extubated 6/30.   OT comments  Pt seen for OT tx this date. OT provided education re: ECS strategies including use of TTB and AE for energy conservation and pacing for BADLs. Pt reports he has been on RA and improving SOB overall. Completes functional transfers, mobility and standing toileting tasks with supervision. Functional mobility with SpO2 assessed while walking on unit - pt with good carryover of pursed lip breathing and SpO2 remains 92%> throughout on RA, during mobility & 94%> at rest. Pt verbalizes understanding of education provided, discharge recommendation appropriate. OT to continue POC.       If plan is discharge home, recommend the following:  A little help with walking and/or transfers;A little help with bathing/dressing/bathroom   Equipment Recommendations  Tub/shower seat       Precautions / Restrictions Precautions Recall of Precautions/Restrictions: Intact Precaution/Restrictions Comments: mod fall Restrictions Weight Bearing Restrictions Per Provider Order: No       Mobility Bed Mobility Overal bed mobility: Modified Independent             General bed mobility comments: Not assessed; pt in chair pre/post session    Transfers Overall transfer level: Independent                 General transfer comment: Pt was independent with completing STS from chair without use of AD. supervision for safety     Balance Overall balance assessment: Needs assistance Sitting-balance support: Feet supported, No upper extremity supported Sitting balance-Leahy Scale: Normal     Standing balance support: No upper extremity  supported, During functional activity Standing balance-Leahy Scale: Good                             ADL either performed or assessed with clinical judgement   ADL Overall ADL's : Needs assistance/impaired     Grooming: Wash/dry hands;Standing;Supervision/safety Grooming Details (indicate cue type and reason): at sink in bathroom                 Toilet Transfer: Supervision/safety   Toileting- Architect and Hygiene: Supervision/safety;Sit to/from stand       Functional mobility during ADLs: Supervision/safety General ADL Comments: functional mobility 1 lap in hallway, continent urine void standing at commode     Communication Communication Communication: No apparent difficulties   Cognition Arousal: Alert Behavior During Therapy: WFL for tasks assessed/performed Cognition: No apparent impairments                               Following commands: Intact        Cueing   Cueing Techniques: Verbal cues  Exercises Other Exercises Other Exercises: Educated on ECS, pacing, use of TTB and RW for ADLs/mobility, discussed use of pulse ox and BP machine at home to monitor vitals       General Comments Pt on RA throughout, SpO2 94% at rest, dropping to 92% with mobility in hallway. Pt with good carryover of PLB and ECS strategies.    Pertinent Vitals/ Pain       Pain Assessment Pain Assessment: No/denies pain   Frequency  Min 2X/week        Progress Toward Goals  OT Goals(current goals can now be found in the care plan section)  Progress towards OT goals: Progressing toward goals  Acute Rehab OT Goals OT Goal Formulation: With patient Time For Goal Achievement: 10/17/23 Potential to Achieve Goals: Good  Plan         AM-PAC OT 6 Clicks Daily Activity     Outcome Measure   Help from another person eating meals?: None Help from another person taking care of personal grooming?: None Help from another person  toileting, which includes using toliet, bedpan, or urinal?: A Little Help from another person bathing (including washing, rinsing, drying)?: A Little Help from another person to put on and taking off regular upper body clothing?: None Help from another person to put on and taking off regular lower body clothing?: A Little 6 Click Score: 21    End of Session Equipment Utilized During Treatment: Gait belt  OT Visit Diagnosis: Other abnormalities of gait and mobility (R26.89)   Activity Tolerance Patient tolerated treatment well   Patient Left with call bell/phone within reach;in chair;with chair alarm set   Nurse Communication Mobility status        Time: 1031-1100 OT Time Calculation (min): 29 min  Charges: OT General Charges $OT Visit: 1 Visit OT Treatments $Self Care/Home Management : 8-22 mins $Therapeutic Activity: 8-22 mins Irvine Glorioso L. Meili Kleckley, OTR/L  10/05/23, 11:14 AM  10/05/2023, 11:11 AM

## 2023-10-05 NOTE — Progress Notes (Signed)
 Patient discharge home. Writer reviewed home care instructions with patient and has no further questions at this time. Patient states he is calling for a ride. Patient to be transported to front lobby via wheelchair by staff. Patient has all belongings.

## 2023-10-05 NOTE — Plan of Care (Signed)
   Problem: Education: Goal: Knowledge of General Education information will improve Description: Including pain rating scale, medication(s)/side effects and non-pharmacologic comfort measures Outcome: Progressing   Problem: Clinical Measurements: Goal: Will remain free from infection Outcome: Progressing

## 2023-10-05 NOTE — TOC Transition Note (Addendum)
 Transition of Care [facility]) - Discharge Note   Patient Details  Name: Stephen [name] MRN: [id number] Date of Birth: 01-04-1956  [facility] CM/SW Contact:  Elouise LULLA Capri, RN 10/05/2023, 10:42 AM   Clinical Narrative:     Discharge orders noted. Per patient, has private transportation arranged. CM and patient discussed home health PT recommendations and DME recommendations for tub/shower seat. Per patient declined home health PT recommendations and is amenable to tub/shower seat.   CM secure message to [employer] regarding tub/shower seat for home delivery. CM alert to Dr. Kandis regarding declining home health PT and signature for DME order for tub/shower seat.   Secure message from [employer], tub/shower seat not covered. CM call to patient's phone: [phone] regarding tub/shower seat not covered. CM and patient discussed alternate community vendor. Patient verbalized understanding. Patient states has follow up appointment with PCP on Friday.  Final next level of care: Home/Self Care Barriers to Discharge: No Barriers Identified   Patient Goals and CMS Choice    Home/self care  DME: tub/shower seat Discharge Placement       Home health PT       Discharge Plan and Services Additional resources added to the After Visit Summary for   In-house Referral: Clinical Social Work Discharge Planning Services: CM Consult Post Acute Care Choice: Home Health          DME Arranged: Tub bench DME Agency: [facility] Date DME Agency Contacted: 10/05/23         Date HH Agency Contacted: 10/05/23   Representative spoke with at [facility] Agency: [place]  Social Drivers of Health (SDOH) Interventions SDOH Screenings   Food Insecurity: No Food Insecurity (10/02/2023)  Recent Concern: Food Insecurity - Food Insecurity Present (08/29/2023)   [facility]  Housing: Low Risk  (10/02/2023)  Recent Concern: Housing - High Risk (08/29/2023)    Received from [facility]  Transportation Needs: No Transportation Needs (10/02/2023)  Utilities: Not At Risk (10/02/2023)  Alcohol Screen: Low Risk  (08/18/2022)  Depression (PHQ2-9): Low Risk  (12/14/2022)  Financial Resource Strain: Medium Risk (08/29/2023)   [facility] from [facility]  Physical Activity: Inactive (08/18/2022)  Social Connections: Unknown (10/02/2023)  Stress: No Stress Concern Present (08/18/2022)  Tobacco Use: High Risk (10/01/2023)     Readmission Risk Interventions     No data to display

## 2023-10-05 NOTE — Discharge Summary (Signed)
 Stephen [name] FMW:[id number] DOB: 27-Dec-1955 DOA: 10/01/2023  PCP: Cleotilde Oneil FALCON, MD  Admit date: 10/01/2023 Discharge date: 10/05/2023  Time spent: 35 minutes  Recommendations for Outpatient Follow-up:  Pcp f/u one week as scheduled     Discharge Diagnoses:  Principal Problem:   Acute on chronic respiratory failure with hypoxia and hypercapnia (HCC) Active Problems:   Current every day smoker   Coronary artery calcification of native artery   COPD with acute exacerbation (HCC)   Hyponatremia   Discharge Condition: improved  Diet recommendation: heart healthy  Filed Weights   10/03/23 0500 10/04/23 0500 10/05/23 0500  Weight: 86 kg 85 kg 84.3 kg    History of present illness:  From admission h and p Patient is a 68 year old male with a past medical history of advanced COPD who presented to the hospital with increased shortness of breath and respiratory failure secondary to COPD exacerbation.   Patient was intubated and sedated at the time of my evaluation.  History was obtained through collateral from his son as well as review of the medical record.  Patient had been feeling increased shortness of breath of the past few days, acute on chronic given his underlying COPD.  He lives at a local hotel where he was found somnolent and slow to respond.  EMS was activated and he was brought in by ambulance hypoxic with minimal air movement and diffuse wheeze.   In the ED, initial blood gas showed acute on chronic respiratory acidosis with profound hypercapnia that failed to improve with BiPAP.  With worsening mental status and worsening acidemia, decision was made by the ED physician to intubate the patient and admit to the ICU for further management.  Subsequent ABG following intubation showed improvement with pH of 7.33 and CO2 of 68.  CT of the chest with PE protocol was performed showing no pulmonary embolism but with significant emphysema.   Patient has known underlying severe COPD  followed by Dr. Theotis from [facility] pulmonology.  He is an active smoker.  He is maintained on triple therapy with Breztri , Roflumilast, and nebulizers.  I do not have access to the patient's outpatient PFTs.  Patient was seen in the ED in March 2025 with a COPD exacerbation treated with steroids and antibiotics.  He is also received courses of steroids and antibiotics and January and February of this year for similar chief complaint.    Hospital Course:  Patient presented with acute respiratory failure with hypoxia and hypercarbia secondary to copd exacerbation, requiring intubation. Now extubated and weaned to room air, breathing at baseline. Treated here with steroids, will complete a course of steroids and taper as outpatient. Hospital course complicated by urinary retention requiring foley catheterization. Home flomax  was increased and patient passed trial of void - consider urology f/u. Other chronic medical problems stable. Smoking cessation counseling provided.   Procedures: intubation   Consultations: pccm  Discharge Exam: Vitals:   10/05/23 0425 10/05/23 0744  BP: (!) 115/53 116/75  Pulse: 88 90  Resp: 18 18  Temp: (!) 97.5 F (36.4 C) 98.2 F (36.8 C)  SpO2: (!) 87% 91%    General: NAD Cardiovascular: rrr Respiratory: few scattered rhonchi  Discharge Instructions   Discharge Instructions     Diet - low sodium heart healthy   Complete by: As directed    Increase activity slowly   Complete by: As directed       Allergies as of 10/05/2023   No Known Allergies  Medication List     STOP taking these medications    montelukast 10 MG tablet Commonly known as: SINGULAIR   temazepam 15 MG capsule Commonly known as: RESTORIL       TAKE these medications    albuterol  108 (90 Base) MCG/ACT inhaler Commonly known as: Ventolin  HFA INHALE 2 PUFFS INTO THE LUNGS EVERY 6 HOURS AS NEEDED FOR WHEEZING ORSHORTNESS OF BREATH   aspirin  EC 81 MG tablet Take 1  tablet (81 mg total) by mouth daily. Swallow whole.   B-12 1000 MCG Tbdp PLACE 1 TABLET UNDER THE TONGUE DAILY   Breztri  Aerosphere 160-9-4.8 MCG/ACT Aero inhaler Generic drug: budesonide -glycopyrrolate-formoterol  Inhale 2 puffs into the lungs 2 (two) times daily.   buPROPion 150 MG 24 hr tablet Commonly known as: WELLBUTRIN XL Take 150 mg by mouth daily.   clonazePAM  1 MG tablet Commonly known as: KLONOPIN  Take 1 mg by mouth at bedtime as needed.   ipratropium-albuterol  0.5-2.5 (3) MG/3ML Soln Commonly known as: DUONEB Inhale 3 mLs into the lungs 3 (three) times daily.   predniSONE  10 MG tablet Commonly known as: DELTASONE  4 tabs daily for 2 days then 3 tabs daily for 2 days then 2 tabs daily for 2 days then 1 tab daily for 2 days What changed:  how much to take how to take this when to take this additional instructions   roflumilast 500 MCG Tabs tablet Commonly known as: DALIRESP Take 500 mcg by mouth daily.   rosuvastatin  10 MG tablet Commonly known as: Crestor  Take 1 tablet (10 mg total) by mouth daily.   tamsulosin  0.4 MG Caps capsule Commonly known as: FLOMAX  Take 2 capsules (0.8 mg total) by mouth daily. What changed: how much to take   traZODone 100 MG tablet Commonly known as: DESYREL Take 100 mg by mouth at bedtime. What changed: Another medication with the same name was removed. Continue taking this medication, and follow the directions you see here.   Trelegy Ellipta  100-62.5-25 MCG/ACT Aepb Generic drug: Fluticasone-Umeclidin-Vilant Inhale 1 puff into the lungs daily.       No Known Allergies  Follow-up Information     Cleotilde Oneil FALCON, MD Follow up.   Specialty: Internal Medicine Why: this friday as scheduled Contact information: [address] [facility] [place] [address] [phone]                  The results of significant diagnostics from this hospitalization (including imaging,  microbiology, ancillary and laboratory) are listed below for reference.    Significant Diagnostic Studies: CT Angio Chest PE W/Cm &/Or Wo Cm Result Date: 10/01/2023 CLINICAL DATA:  Altered mental status, hypoxia, high prob PE suspected EXAM: CT ANGIOGRAPHY CHEST WITH CONTRAST TECHNIQUE: Multidetector CT imaging of the chest was performed using the standard protocol during bolus administration of intravenous contrast. Multiplanar CT image reconstructions and MIPs were obtained to evaluate the vascular anatomy. RADIATION DOSE REDUCTION: This exam was performed according to the departmental dose-optimization program which includes automated exposure control, adjustment of the mA and/or kV according to patient size and/or use of iterative reconstruction technique. CONTRAST:  75mL OMNIPAQUE  IOHEXOL  350 MG/ML SOLN COMPARISON:  06/19/2023 FINDINGS: Cardiovascular: Heart size normal. Trace pericardial fluid. The RV is nondilated. Satisfactory opacification of pulmonary arteries noted, and there is no evidence of pulmonary emboli. Scattered 3-vessel coronary calcifications. Coarse aortic leaflet calcifications. Scattered calcified aortic plaque without evident dissection or aneurysm. Mediastinum/Nodes: No mediastinal hematoma, mass, or adenopathy. Lungs/Pleura: No pleural  effusion. No pneumothorax. Pulmonary emphysema with bilateral subpleural blebs medially. 1.5 cm pleural based opacity posteriorly at the left apex stable. Minimal atelectasis posteriorly at the lung bases. Upper Abdomen: 8.9 cm right hepatic cyst as before, and additional scattered smaller hepatic cysts, stable. No acute findings. Gastric tube into the partially decompressed stomach. Musculoskeletal: No chest wall abnormality. No acute or significant osseous findings. Review of the MIP images confirms the above findings. IMPRESSION: 1. Negative for acute PE or thoracic aortic dissection. 2. Stable 1.5 cm pleural based opacity posteriorly at the left  apex. 3. Aortic Atherosclerosis (ICD10-I70.0) and Emphysema (ICD10-J43.9). Electronically Signed   By: JONETTA Faes M.D.   On: 10/01/2023 11:21   DG Chest 1 View Result Date: 10/01/2023 CLINICAL DATA:  Evaluate ET tube placement. EXAM: CHEST  1 VIEW COMPARISON:  06/19/2023. FINDINGS: The endotracheal tube tip terminates above the carina. There is an enteric tube with tip and side port below the GE junction within the proximal esophagus. Heart size and mediastinal contours are normal. Aortic atherosclerotic calcifications. No pleural fluid, interstitial edema, or airspace disease. Visualized osseous structures are intact. IMPRESSION: 1. Satisfactory position of endotracheal tube. 2. Enteric tube with tip and side port just below the GE junction within the proximal esophagus. Consider further advancement advancement. Electronically Signed   By: Waddell Calk M.D.   On: 10/01/2023 10:41   CT Head Wo Contrast Result Date: 10/01/2023 CLINICAL DATA:  Mental status change.  Un known etiology. EXAM: CT HEAD WITHOUT CONTRAST TECHNIQUE: Contiguous axial images were obtained from the base of the skull through the vertex without intravenous contrast. RADIATION DOSE REDUCTION: This exam was performed according to the departmental dose-optimization program which includes automated exposure control, adjustment of the mA and/or kV according to patient size and/or use of iterative reconstruction technique. COMPARISON:  None Available. FINDINGS: Brain: No evidence of acute infarction, hemorrhage, hydrocephalus, extra-axial collection or mass lesion/mass effect. Vascular: No evidence of acute infarction, hemorrhage, hydrocephalus, extra-axial collection or mass lesion/mass effect. Skull: Normal. Negative for fracture or focal lesion. Sinuses/Orbits: No acute finding. Other: None. IMPRESSION: No acute intracranial abnormalities. Electronically Signed   By: Waddell Calk M.D.   On: 10/01/2023 09:32   DG Chest Portable 1  View Result Date: 10/01/2023 CLINICAL DATA:  68 year old male with progressive shortness of breath since yesterday. EXAM: PORTABLE CHEST 1 VIEW COMPARISON:  CTA chest 06/19/2023 and earlier. FINDINGS: Portable AP upright view at 0908 hours. Large lung volumes with emphysema demonstrated on the CTA this year. Normal cardiac size and mediastinal contours. Visualized tracheal air column is within normal limits. Allowing for portable technique the lungs are clear. No pneumothorax or pleural effusion. Negative visible bowel gas. No acute osseous abnormality identified. IMPRESSION: Emphysema. No acute cardiopulmonary abnormality. Electronically Signed   By: VEAR Hurst M.D.   On: 10/01/2023 09:19    Microbiology: Recent Results (from the past 240 hours)  Culture, blood (routine x 2)     Status: None (Preliminary result)   Collection Time: 10/01/23  8:45 AM   Specimen: BLOOD  Result Value Ref Range Status   Specimen Description BLOOD BLOOD LEFT HAND  Final   Special Requests   Final    BOTTLES DRAWN AEROBIC AND ANAEROBIC Blood Culture results may not be optimal due to an inadequate volume of blood received in culture bottles   Culture   Final    NO GROWTH 4 DAYS Performed at [facility], [address], [place], [place] [address]  Report Status PENDING  Incomplete  Culture, blood (routine x 2)     Status: None (Preliminary result)   Collection Time: 10/01/23  8:45 AM   Specimen: BLOOD  Result Value Ref Range Status   Specimen Description BLOOD LEFT ARM  Final   Special Requests   Final    BOTTLES DRAWN AEROBIC AND ANAEROBIC Blood Culture results may not be optimal due to an inadequate volume of blood received in culture bottles   Culture   Final    NO GROWTH 4 DAYS Performed at [facility], [address], [place], [place] [address]    Report Status PENDING  Incomplete  MRSA Next Gen by PCR, Nasal     Status: None   Collection Time: 10/01/23 12:40 PM  Result Value Ref  Range Status   MRSA by PCR Next Gen NOT DETECTED NOT DETECTED Final    Comment: (NOTE) The GeneXpert MRSA Assay (FDA approved for NASAL specimens only), is one component of a comprehensive MRSA colonization surveillance program. It is not intended to diagnose MRSA infection nor to guide or monitor treatment for MRSA infections. Test performance is not FDA approved in patients less than 34 years old. Performed at [facility], [address] [place], [place] [address]   Resp panel by RT-PCR (RSV, Flu A&B, Covid) Nasal Mucosa     Status: None   Collection Time: 10/01/23  1:53 PM   Specimen: Nasal Mucosa; Nasal Swab  Result Value Ref Range Status   SARS Coronavirus 2 by RT PCR NEGATIVE NEGATIVE Final    Comment: (NOTE) SARS-CoV-2 target nucleic acids are NOT DETECTED.  The SARS-CoV-2 RNA is generally detectable in upper respiratory specimens during the acute phase of infection. The lowest concentration of SARS-CoV-2 viral copies this assay can detect is 138 copies/mL. A negative result does not preclude SARS-Cov-2 infection and should not be used as the sole basis for treatment or other patient management decisions. A negative result may occur with  improper specimen collection/handling, submission of specimen other than nasopharyngeal swab, presence of viral mutation(s) within the areas targeted by this assay, and inadequate number of viral copies(<138 copies/mL). A negative result must be combined with clinical observations, patient history, and epidemiological information. The expected result is Negative.  Fact Sheet for Patients:  BloggerCourse.com  Fact Sheet for Healthcare Providers:  SeriousBroker.it  This test is no t yet approved or cleared by the [place]  FDA and  has been authorized for detection and/or diagnosis of SARS-CoV-2 by FDA under an Emergency Use Authorization (EUA). This EUA will remain  in  effect (meaning this test can be used) for the duration of the COVID-19 declaration under Section 564(b)(1) of the Act, 21 U.S.C.section 360bbb-3(b)(1), unless the authorization is terminated  or revoked sooner.       Influenza A by PCR NEGATIVE NEGATIVE Final   Influenza B by PCR NEGATIVE NEGATIVE Final    Comment: (NOTE) The Xpert Xpress SARS-CoV-2/FLU/RSV plus assay is intended as an aid in the diagnosis of influenza from Nasopharyngeal swab specimens and should not be used as a sole basis for treatment. Nasal washings and aspirates are unacceptable for Xpert Xpress SARS-CoV-2/FLU/RSV testing.  Fact Sheet for Patients: BloggerCourse.com  Fact Sheet for Healthcare Providers: SeriousBroker.it  This test is not yet approved or cleared by the [place]  FDA and has been authorized for detection and/or diagnosis of SARS-CoV-2 by FDA under an Emergency Use Authorization (EUA). This EUA will remain in effect (meaning this test  can be used) for the duration of the COVID-19 declaration under Section 564(b)(1) of the Act, 21 U.S.C. section 360bbb-3(b)(1), unless the authorization is terminated or revoked.     Resp Syncytial Virus by PCR NEGATIVE NEGATIVE Final    Comment: (NOTE) Fact Sheet for Patients: BloggerCourse.com  Fact Sheet for Healthcare Providers: SeriousBroker.it  This test is not yet approved or cleared by the [place]  FDA and has been authorized for detection and/or diagnosis of SARS-CoV-2 by FDA under an Emergency Use Authorization (EUA). This EUA will remain in effect (meaning this test can be used) for the duration of the COVID-19 declaration under Section 564(b)(1) of the Act, 21 U.S.C. section 360bbb-3(b)(1), unless the authorization is terminated or revoked.  Performed at [facility], [address] [place], [place] [address]    Respiratory (~20 pathogens) panel by PCR     Status: None   Collection Time: 10/01/23  1:53 PM   Specimen: Nasopharyngeal Swab; Respiratory  Result Value Ref Range Status   Adenovirus NOT DETECTED NOT DETECTED Final   Coronavirus 229E NOT DETECTED NOT DETECTED Final    Comment: (NOTE) The Coronavirus on the Respiratory Panel, DOES NOT test for the novel  Coronavirus (2019 nCoV)    Coronavirus HKU1 NOT DETECTED NOT DETECTED Final   Coronavirus NL63 NOT DETECTED NOT DETECTED Final   Coronavirus OC43 NOT DETECTED NOT DETECTED Final   Metapneumovirus NOT DETECTED NOT DETECTED Final   Rhinovirus / Enterovirus NOT DETECTED NOT DETECTED Final   Influenza A NOT DETECTED NOT DETECTED Final   Influenza B NOT DETECTED NOT DETECTED Final   Parainfluenza Virus 1 NOT DETECTED NOT DETECTED Final   Parainfluenza Virus 2 NOT DETECTED NOT DETECTED Final   Parainfluenza Virus 3 NOT DETECTED NOT DETECTED Final   Parainfluenza Virus 4 NOT DETECTED NOT DETECTED Final   Respiratory Syncytial Virus NOT DETECTED NOT DETECTED Final   Bordetella pertussis NOT DETECTED NOT DETECTED Final   Bordetella Parapertussis NOT DETECTED NOT DETECTED Final   Chlamydophila pneumoniae NOT DETECTED NOT DETECTED Final   Mycoplasma pneumoniae NOT DETECTED NOT DETECTED Final    Comment: Performed at [facility], [address], [place], [place] [address]     Labs: Basic Metabolic Panel: Recent Labs  Lab 10/01/23 0844 10/02/23 0706 10/03/23 0426  NA 135 133* 137  K 4.7 4.4 4.4  CL 92* 92* 94*  CO2 35* 30 31  GLUCOSE 133* 130* 124*  BUN 13 18 22   CREATININE 0.63 0.84 0.64  CALCIUM  9.1 8.9 8.7*  MG  --  1.8 2.2  PHOS  --   --  3.4   Liver Function Tests: Recent Labs  Lab 10/01/23 0844  AST 14*  ALT 9  ALKPHOS 66  BILITOT 1.0  PROT 7.0  ALBUMIN 3.8   No results for input(s): LIPASE, AMYLASE in the last 168 hours. No results for input(s): AMMONIA in the last 168 hours. CBC: Recent Labs   Lab 10/01/23 0844 10/02/23 0705 10/03/23 0426  WBC 9.9 8.5 7.8  NEUTROABS 7.8*  --  7.4  HGB 15.6 14.7 14.4  HCT 50.1 44.2 44.0  MCV 100.0 96.5 97.1  PLT 275 215 222   Cardiac Enzymes: No results for input(s): CKTOTAL, CKMB, CKMBINDEX, TROPONINI in the last 168 hours. BNP: BNP (last 3 results) Recent Labs    10/01/23 0844  BNP 77.2    ProBNP (last 3 results) No results for input(s): PROBNP in the last 8760 hours.  CBG: Recent Labs  Lab 10/02/23 1632 10/02/23 1936 10/02/23 2349 10/03/23 0402 10/03/23 0730  GLUCAP 99 123* 87 123* 111*       Signed:  Devaughn KATHEE Ban MD.  [facility] 10/05/2023, 10:24 AM

## 2023-10-06 LAB — CULTURE, BLOOD (ROUTINE X 2)
Culture: NO GROWTH
Culture: NO GROWTH

## 2023-10-10 DIAGNOSIS — F419 Anxiety disorder, unspecified: Secondary | ICD-10-CM | POA: Diagnosis not present

## 2023-10-10 DIAGNOSIS — F17290 Nicotine dependence, other tobacco product, uncomplicated: Secondary | ICD-10-CM | POA: Diagnosis not present

## 2023-10-10 DIAGNOSIS — F32A Depression, unspecified: Secondary | ICD-10-CM | POA: Diagnosis not present

## 2023-10-10 DIAGNOSIS — J449 Chronic obstructive pulmonary disease, unspecified: Secondary | ICD-10-CM | POA: Diagnosis not present

## 2023-10-10 DIAGNOSIS — J9600 Acute respiratory failure, unspecified whether with hypoxia or hypercapnia: Secondary | ICD-10-CM | POA: Diagnosis not present

## 2023-10-10 DIAGNOSIS — E119 Type 2 diabetes mellitus without complications: Secondary | ICD-10-CM | POA: Diagnosis not present

## 2023-10-11 ENCOUNTER — Telehealth: Payer: Self-pay | Admitting: *Deleted

## 2023-10-11 ENCOUNTER — Telehealth: Payer: Self-pay

## 2023-10-11 DIAGNOSIS — R4182 Altered mental status, unspecified: Secondary | ICD-10-CM

## 2023-10-11 NOTE — Progress Notes (Signed)
 Complex Care Management Note  Care Guide Note 10/11/2023 Name: Wilmore Holsomback MRN: [id number] DOB: [birth year]  Norleen Lied is a 68 y.o. year old male who sees Cleotilde Oneil FALCON, MD for primary care. I reached out to [facility] by phone today to offer complex care management services.  Mr. Cinque was given information about Complex Care Management services today including:   The Complex Care Management services include support from the care team which includes your Nurse Care Manager, Clinical Social Worker, or Pharmacist.  The Complex Care Management team is here to help remove barriers to the health concerns and goals most important to you. Complex Care Management services are voluntary, and the patient may decline or stop services at any time by request to their care team member.   Complex Care Management Consent Status: Patient agreed to services and verbal consent obtained.   Follow up plan:  Telephone appointment with complex care management team member scheduled for:  10/17/2023  Encounter Outcome:  Patient Scheduled  Thedford Franks, CMA [place]  [facility], [facility] Guide Direct Dial: [phone]  Fax: [phone] Website: [email or web address]

## 2023-10-17 ENCOUNTER — Other Ambulatory Visit: Payer: Self-pay

## 2023-10-17 ENCOUNTER — Telehealth: Payer: Self-pay

## 2023-10-17 NOTE — Patient Outreach (Signed)
 LCSW called patient at scheduled time for visit. LCSW informed patient for the reason for the call due to responses from the survey (EMMI). Patient explained that he is not feeling that way however does have other needs. Patient explained could her rescheduled the visit. Visit was rescheduled for 10/26/2023 at 10:00 am,  Olam Ally, MSW, [employer] [facility], [facility] Licensed Clinical Social Worker Direct Dial: [phone]

## 2023-10-26 ENCOUNTER — Other Ambulatory Visit: Payer: Self-pay

## 2023-10-26 NOTE — Patient Outreach (Signed)
 LCSW spoke with patient for scheduled visit. LCSW followed up again for the reason for the referral(EMMI). Patient explained that he is not feeling down or depressed. Patient also suggested that it should be a follow up question in the survey. LCSW inquired if patient had any other needs. Patient explained that he could not identify any more needs and declined enrollment in [facility] services. LCSW asked patient if LCSW could follow up with patient and patient declined. LCSW informed patient that he could follow up with LCSW in the future if needs arise.

## 2023-10-26 NOTE — Patient Instructions (Signed)
 Visit Information  Thank you for taking time to visit with me today. Please don't hesitate to contact me if I can be of assistance to you before our next scheduled appointment.  Our next appointment is no further scheduled appointments.  Patient has declined enrollment in [place] services.  Please call the care guide team at [phone] if you need to cancel or reschedule your appointment.     Please call 911 if you are experiencing a Mental Health or Behavioral Health Crisis or need someone to talk to.  Patient verbalizes understanding of instructions and care plan provided today and agrees to view in MyChart. Active MyChart status and patient understanding of how to access instructions and care plan via MyChart confirmed with patient.     Olam Ally, MSW, LCSW [facility], [facility] Licensed Clinical Social Worker Direct Dial: [phone]

## 2023-12-11 DIAGNOSIS — F17200 Nicotine dependence, unspecified, uncomplicated: Secondary | ICD-10-CM | POA: Diagnosis not present

## 2023-12-11 DIAGNOSIS — E1169 Type 2 diabetes mellitus with other specified complication: Secondary | ICD-10-CM | POA: Diagnosis not present

## 2023-12-11 DIAGNOSIS — J449 Chronic obstructive pulmonary disease, unspecified: Secondary | ICD-10-CM | POA: Diagnosis not present

## 2023-12-11 DIAGNOSIS — E119 Type 2 diabetes mellitus without complications: Secondary | ICD-10-CM | POA: Diagnosis not present

## 2023-12-11 DIAGNOSIS — E782 Mixed hyperlipidemia: Secondary | ICD-10-CM | POA: Diagnosis not present

## 2023-12-11 DIAGNOSIS — F32A Depression, unspecified: Secondary | ICD-10-CM | POA: Diagnosis not present

## 2023-12-11 NOTE — Progress Notes (Signed)
 Patient Profile:   Stephen [name]  is a 68 y.o.  male Chief Complaint  Patient presents with  . Follow-up      PROBLEM LIST: Past Medical History:  Diagnosis Date  . B12 deficiency 04/12/2023  . COPD (chronic obstructive pulmonary disease) (CMS/HHS-HCC)   . DM type 2 with diabetic mixed hyperlipidemia  (CMS/HHS-HCC) 04/12/2023  . Hyperlipidemia, mixed 04/12/2023    No past surgical history on file.  ALLERGIES: No Known Allergies  CURRENT MEDICATIONS: Current Outpatient Medications  Medication Sig Dispense Refill  . albuterol  90 mcg/actuation inhaler INHALE 2 PUFFS INTO THE LUNGS EVERY 6 HOURS AS NEEDED FOR WHEEZING ORSHORTNESS OF BREATH    . aspirin  81 MG EC tablet Take 81 mg by mouth once daily    . budesonide -glycopyrrolate-formoterol  (BREZTRI  AEROSPHERE) 160-9-4.8 mcg/actuation inhaler Inhale 2 inhalations into the lungs 2 (two) times daily 10.7 g 7  . clonazePAM  (KLONOPIN ) 1 MG tablet 1/2 pill in the morning, 1 tab at bedtime 45 tablet 5  . ipratropium-albuteroL  (DUO-NEB) nebulizer solution TAKE 3 MLS BY NEBULIZATION 3 TIMES DAILY 360 mL 3  . nicotine  (NICODERM CQ ) 21 mg/24 hr patch Place 1 patch onto the skin once daily 30 patch 5  . roflumilast (DALIRESP) 500 mcg tablet Take 1 tablet (0.5 mg total) by mouth once daily 30 tablet 11  . tamsulosin  (FLOMAX ) 0.4 mg capsule Take 1 capsule (0.4 mg total) by mouth 2 (two) times daily 180 capsule 3  . TORsemide (DEMADEX) 5 MG tablet Take 1 tablet (5 mg total) by mouth once daily as needed (edema) 30 tablet 11  . traZODone (DESYREL) 100 MG tablet 1 or 2 pills at bedtime 60 tablet 11  . albuterol  (PROVENTIL ) 2.5 mg /3 mL (0.083 %) nebulizer solution Take 3 mLs (2.5 mg total) by nebulization every 6 (six) hours as needed (Patient not taking: Reported on 08/29/2023) 75 mL 1   No current facility-administered medications for this visit.      HPI   CLINICAL SUMMARY:  Patient doing a lot  better.  Has end-stage lung disease, working at [employer], does not do much walking, breathing stable.  Down to less than 1 pack/day smoking.  Urination better with double dose Flomax .  Depression stable on clonazepam   ROS: Review of systems is unremarkable for any active cardiac, respiratory, GI, GU, hematologic, neurologic, dermatologic, HEENT, or psychiatric symptoms except as noted above, 10 systems reviewed.  No fevers, chills, or constitutional symptoms.   PHYSICAL EXAM  Vital signs:  BP 132/78   Pulse 85   Wt 85.8 kg (189 lb 3.2 oz)   SpO2 97%   BMI 25.66 kg/m  Body mass index is 25.66 kg/m.   Wt Readings from Last 3 Encounters:  12/11/23 85.8 kg (189 lb 3.2 oz)  10/10/23 87.8 kg (193 lb 9.6 oz)  08/29/23 85.9 kg (189 lb 6.4 oz)     BP Readings from Last 3 Encounters:  12/11/23 132/78  10/10/23 (!) 140/80  08/29/23 (!) 146/80    Constitutional:NAD Neck: supple, no thyromegaly, good ROM Respiratory:clear to auscultation, no rales or wheezes Cardiovascular:RRR, no murmur or gallop Abdominal:soft, good BS, NT Ext: no edema, good peripheral pulses Neuro: alert and oriented X 3, grossly nonfocal     ASSESSMENT/PLAN   End-stage COPD-on Daliresp, inhaler, stable, has significant history of CO2 retention Tobacco use-smoking  less than 1 pack/day, really challenged him to continue further and get off the cigarettes Type 2 diabetes-diabetic eye check, numbers pending BPH-double dose Flomax  very helpful Depression-stable on clonazepam   Has come a long way, lives and works at [employer]  Dispo:   Return in about 4 months (around 04/11/2024) for followup.

## 2024-03-07 ENCOUNTER — Emergency Department

## 2024-03-07 ENCOUNTER — Inpatient Hospital Stay

## 2024-03-07 ENCOUNTER — Other Ambulatory Visit: Payer: Self-pay

## 2024-03-07 ENCOUNTER — Inpatient Hospital Stay
Admission: EM | Admit: 2024-03-07 | Discharge: 2024-03-10 | DRG: 208 | Disposition: A | Attending: Obstetrics and Gynecology | Admitting: Obstetrics and Gynecology

## 2024-03-07 DIAGNOSIS — J441 Chronic obstructive pulmonary disease with (acute) exacerbation: Secondary | ICD-10-CM | POA: Diagnosis not present

## 2024-03-07 DIAGNOSIS — J9602 Acute respiratory failure with hypercapnia: Secondary | ICD-10-CM | POA: Diagnosis not present

## 2024-03-07 DIAGNOSIS — J432 Centrilobular emphysema: Secondary | ICD-10-CM | POA: Diagnosis not present

## 2024-03-07 DIAGNOSIS — Z950 Presence of cardiac pacemaker: Secondary | ICD-10-CM | POA: Diagnosis not present

## 2024-03-07 DIAGNOSIS — Z720 Tobacco use: Secondary | ICD-10-CM | POA: Diagnosis not present

## 2024-03-07 DIAGNOSIS — J9601 Acute respiratory failure with hypoxia: Secondary | ICD-10-CM | POA: Diagnosis not present

## 2024-03-07 DIAGNOSIS — F17200 Nicotine dependence, unspecified, uncomplicated: Secondary | ICD-10-CM | POA: Diagnosis present

## 2024-03-07 DIAGNOSIS — J438 Other emphysema: Secondary | ICD-10-CM

## 2024-03-07 DIAGNOSIS — E1169 Type 2 diabetes mellitus with other specified complication: Secondary | ICD-10-CM | POA: Diagnosis present

## 2024-03-07 DIAGNOSIS — Z4682 Encounter for fitting and adjustment of non-vascular catheter: Secondary | ICD-10-CM | POA: Diagnosis not present

## 2024-03-07 DIAGNOSIS — Z0389 Encounter for observation for other suspected diseases and conditions ruled out: Secondary | ICD-10-CM | POA: Diagnosis not present

## 2024-03-07 DIAGNOSIS — J449 Chronic obstructive pulmonary disease, unspecified: Secondary | ICD-10-CM | POA: Diagnosis present

## 2024-03-07 LAB — URINALYSIS, COMPLETE (UACMP) WITH MICROSCOPIC
Bilirubin Urine: NEGATIVE
Glucose, UA: NEGATIVE mg/dL
Hgb urine dipstick: NEGATIVE
Ketones, ur: NEGATIVE mg/dL
Leukocytes,Ua: NEGATIVE
Nitrite: NEGATIVE
Protein, ur: 100 mg/dL — AB
Specific Gravity, Urine: 1.026 (ref 1.005–1.030)
pH: 5 (ref 5.0–8.0)

## 2024-03-07 LAB — URINE DRUG SCREEN
Amphetamines: NEGATIVE
Barbiturates: NEGATIVE
Benzodiazepines: POSITIVE — AB
Cocaine: NEGATIVE
Fentanyl: NEGATIVE
Methadone Scn, Ur: NEGATIVE
Opiates: NEGATIVE
Tetrahydrocannabinol: POSITIVE — AB

## 2024-03-07 LAB — BLOOD GAS, ARTERIAL
Acid-Base Excess: 8.5 mmol/L — ABNORMAL HIGH (ref 0.0–2.0)
Acid-Base Excess: 5.7 mmol/L — ABNORMAL HIGH (ref 0.0–2.0)
Bicarbonate: 37.8 mmol/L — ABNORMAL HIGH (ref 20.0–28.0)
Bicarbonate: 34 mmol/L — ABNORMAL HIGH (ref 20.0–28.0)
FIO2: 40 %
FIO2: 30 %
MECHVT: 500 mL
MECHVT: 500 mL
O2 Saturation: 99.6 %
O2 Saturation: 95.2 %
PEEP: 8 cmH2O
PEEP: 8 cmH2O
Patient temperature: 37
Patient temperature: 37
RATE: 18 {breaths}/min
RATE: 18 {breaths}/min
pCO2 arterial: 75 mmHg (ref 32–48)
pCO2 arterial: 66 mmHg (ref 32–48)
pH, Arterial: 7.31 — ABNORMAL LOW (ref 7.35–7.45)
pH, Arterial: 7.32 — ABNORMAL LOW (ref 7.35–7.45)
pO2, Arterial: 139 mmHg — ABNORMAL HIGH (ref 83–108)
pO2, Arterial: 65 mmHg — ABNORMAL LOW (ref 83–108)

## 2024-03-07 LAB — CBC WITH DIFFERENTIAL/PLATELET
Abs Immature Granulocytes: 0.03 K/uL (ref 0.00–0.07)
Basophils Absolute: 0.1 K/uL (ref 0.0–0.1)
Basophils Relative: 1 %
Eosinophils Absolute: 0 K/uL (ref 0.0–0.5)
Eosinophils Relative: 0 %
HCT: 48.8 % (ref 39.0–52.0)
Hemoglobin: 15.4 g/dL (ref 13.0–17.0)
Immature Granulocytes: 0 %
Lymphocytes Relative: 13 %
Lymphs Abs: 1 K/uL (ref 0.7–4.0)
MCH: 32.4 pg (ref 26.0–34.0)
MCHC: 31.6 g/dL (ref 30.0–36.0)
MCV: 102.5 fL — ABNORMAL HIGH (ref 80.0–100.0)
Monocytes Absolute: 0.6 K/uL (ref 0.1–1.0)
Monocytes Relative: 7 %
Neutro Abs: 6.4 K/uL (ref 1.7–7.7)
Neutrophils Relative %: 79 %
Platelets: 242 K/uL (ref 150–400)
RBC: 4.76 MIL/uL (ref 4.22–5.81)
RDW: 12.8 % (ref 11.5–15.5)
WBC: 8.2 K/uL (ref 4.0–10.5)
nRBC: 0 % (ref 0.0–0.2)

## 2024-03-07 LAB — BLOOD GAS, VENOUS
Acid-Base Excess: 9.7 mmol/L — ABNORMAL HIGH (ref 0.0–2.0)
Acid-Base Excess: 8.1 mmol/L — ABNORMAL HIGH (ref 0.0–2.0)
Bicarbonate: 42 mmol/L — ABNORMAL HIGH (ref 20.0–28.0)
Bicarbonate: 41.3 mmol/L — ABNORMAL HIGH (ref 20.0–28.0)
O2 Saturation: 82.5 %
O2 Saturation: 71.3 %
Patient temperature: 37
Patient temperature: 37
pCO2, Ven: 105 mmHg (ref 44–60)
pCO2, Ven: 116 mmHg (ref 44–60)
pH, Ven: 7.21 — ABNORMAL LOW (ref 7.25–7.43)
pH, Ven: 7.16 — CL (ref 7.25–7.43)
pO2, Ven: 51 mmHg — ABNORMAL HIGH (ref 32–45)
pO2, Ven: 45 mmHg (ref 32–45)

## 2024-03-07 LAB — COMPREHENSIVE METABOLIC PANEL WITH GFR
ALT: 8 U/L (ref 0–44)
AST: 12 U/L — ABNORMAL LOW (ref 15–41)
Albumin: 4.2 g/dL (ref 3.5–5.0)
Alkaline Phosphatase: 82 U/L (ref 38–126)
Anion gap: 5 (ref 5–15)
BUN: 13 mg/dL (ref 8–23)
CO2: 35 mmol/L — ABNORMAL HIGH (ref 22–32)
Calcium: 9.2 mg/dL (ref 8.9–10.3)
Chloride: 95 mmol/L — ABNORMAL LOW (ref 98–111)
Creatinine, Ser: 0.64 mg/dL (ref 0.61–1.24)
GFR, Estimated: >60 mL/min (ref >60)
Glucose, Bld: 129 mg/dL — ABNORMAL HIGH (ref 70–99)
Potassium: 5.3 mmol/L — ABNORMAL HIGH (ref 3.5–5.1)
Sodium: 135 mmol/L (ref 135–145)
Total Bilirubin: 0.4 mg/dL (ref 0.0–1.2)
Total Protein: 7.2 g/dL (ref 6.5–8.1)

## 2024-03-07 LAB — RESP PANEL BY RT-PCR (RSV, FLU A&B, COVID)  RVPGX2
Influenza A by PCR: NEGATIVE
Influenza B by PCR: NEGATIVE
Resp Syncytial Virus by PCR: NEGATIVE
SARS Coronavirus 2 by RT PCR: NEGATIVE

## 2024-03-07 LAB — GLUCOSE, CAPILLARY
Glucose-Capillary: 120 mg/dL — ABNORMAL HIGH (ref 70–99)
Glucose-Capillary: 130 mg/dL — ABNORMAL HIGH (ref 70–99)
Glucose-Capillary: 130 mg/dL — ABNORMAL HIGH (ref 70–99)

## 2024-03-07 LAB — POTASSIUM: Potassium: 5.1 mmol/L (ref 3.5–5.1)

## 2024-03-07 LAB — PROCALCITONIN: Procalcitonin: <0.1 ng/mL

## 2024-03-07 LAB — LACTIC ACID, PLASMA
Lactic Acid, Venous: 0.7 mmol/L (ref 0.5–1.9)
Lactic Acid, Venous: 1.4 mmol/L (ref 0.5–1.9)

## 2024-03-07 LAB — MRSA NEXT GEN BY PCR, NASAL: MRSA by PCR Next Gen: NOT DETECTED

## 2024-03-07 MED ORDER — SODIUM CHLORIDE 0.9 % IV SOLN
500.0000 mg | INTRAVENOUS | Status: DC
  Administered 2024-03-07 – 2024-03-08 (×2): 500 mg via INTRAVENOUS
  Filled 2024-03-07 (×3): qty 5

## 2024-03-07 MED ORDER — ETOMIDATE 2 MG/ML IV SOLN: 30.0000 mg | Freq: Once | INTRAVENOUS | Status: AC

## 2024-03-07 MED ORDER — ROCURONIUM BROMIDE 10 MG/ML (PF) SYRINGE: 80.0000 mg | PREFILLED_SYRINGE | Freq: Once | INTRAVENOUS | Status: AC

## 2024-03-07 MED ORDER — IPRATROPIUM-ALBUTEROL 0.5-2.5 (3) MG/3ML IN SOLN
6.0000 mL | Freq: Once | RESPIRATORY_TRACT | Status: AC
  Administered 2024-03-07: 6 mL via RESPIRATORY_TRACT
  Filled 2024-03-07: qty 6

## 2024-03-07 MED ORDER — BUDESONIDE 0.5 MG/2ML IN SUSP
0.5000 mg | Freq: Two times a day (BID) | RESPIRATORY_TRACT | Status: DC
  Administered 2024-03-07 – 2024-03-09 (×4): 0.5 mg via RESPIRATORY_TRACT
  Filled 2024-03-07 (×4): qty 2

## 2024-03-07 MED ORDER — FAMOTIDINE 20 MG PO TABS: 20.0000 mg | ORAL_TABLET | Freq: Two times a day (BID) | ORAL | Status: DC

## 2024-03-07 MED ORDER — FENTANYL CITRATE (PF) 50 MCG/ML IJ SOSY
50.0000 ug | PREFILLED_SYRINGE | INTRAMUSCULAR | Status: DC | PRN
  Administered 2024-03-08 (×2): 50 ug via INTRAVENOUS
  Filled 2024-03-07 (×2): qty 1

## 2024-03-07 MED ORDER — MIDAZOLAM HCL (PF) 2 MG/2ML IJ SOLN
1.0000 mg | INTRAMUSCULAR | Status: DC | PRN
  Administered 2024-03-08: 1 mg via INTRAVENOUS
  Filled 2024-03-07: qty 2

## 2024-03-07 MED ORDER — LACTATED RINGERS IV BOLUS
500.0000 mL | Freq: Once | INTRAVENOUS | Status: AC
  Administered 2024-03-07: 500 mL via INTRAVENOUS

## 2024-03-07 MED ORDER — SODIUM CHLORIDE 0.9 % IV SOLN: 250.0000 mL | INTRAVENOUS | Status: AC

## 2024-03-07 MED ORDER — PROPOFOL 1000 MG/100ML IV EMUL
INTRAVENOUS | Status: AC
  Administered 2024-03-07: 10 ug/kg/min via INTRAVENOUS
  Filled 2024-03-07: qty 100

## 2024-03-07 MED ORDER — HEPARIN SODIUM (PORCINE) 5000 UNIT/ML IJ SOLN
5000.0000 [IU] | Freq: Three times a day (TID) | INTRAMUSCULAR | Status: DC
  Administered 2024-03-07 – 2024-03-09 (×6): 5000 [IU] via SUBCUTANEOUS
  Filled 2024-03-07 (×6): qty 1

## 2024-03-07 MED ORDER — DOCUSATE SODIUM 100 MG PO CAPS: 100.0000 mg | ORAL_CAPSULE | Freq: Two times a day (BID) | ORAL | Status: DC | PRN

## 2024-03-07 MED ORDER — ORAL CARE MOUTH RINSE: 15.0000 mL | OROMUCOSAL | Status: DC | PRN

## 2024-03-07 MED ORDER — PANTOPRAZOLE SODIUM 40 MG IV SOLR: 40.0000 mg | Freq: Every day | INTRAVENOUS | Status: DC

## 2024-03-07 MED ORDER — SODIUM CHLORIDE 0.9 % IV SOLN: INTRAVENOUS | Status: DC

## 2024-03-07 MED ORDER — DOCUSATE SODIUM 50 MG/5ML PO LIQD: 100.0000 mg | Freq: Two times a day (BID) | ORAL | Status: DC | PRN

## 2024-03-07 MED ORDER — ORAL CARE MOUTH RINSE
15.0000 mL | OROMUCOSAL | Status: DC
  Administered 2024-03-07 – 2024-03-08 (×11): 15 mL via OROMUCOSAL

## 2024-03-07 MED ORDER — ROCURONIUM BROMIDE 10 MG/ML (PF) SYRINGE
PREFILLED_SYRINGE | INTRAVENOUS | Status: AC
  Administered 2024-03-07: 80 mg via INTRAVENOUS
  Filled 2024-03-07: qty 10

## 2024-03-07 MED ORDER — METHYLPREDNISOLONE SODIUM SUCC 125 MG IJ SOLR
125.0000 mg | Freq: Once | INTRAMUSCULAR | Status: AC
  Administered 2024-03-07: 125 mg via INTRAVENOUS
  Filled 2024-03-07: qty 2

## 2024-03-07 MED ORDER — LACTATED RINGERS IV SOLN: INTRAVENOUS | Status: DC

## 2024-03-07 MED ORDER — CHLORHEXIDINE GLUCONATE CLOTH 2 % EX PADS
6.0000 | MEDICATED_PAD | Freq: Every day | CUTANEOUS | Status: DC
  Administered 2024-03-07: 6 via TOPICAL
  Filled 2024-03-07: qty 6

## 2024-03-07 MED ORDER — METHYLPREDNISOLONE SODIUM SUCC 40 MG IJ SOLR
40.0000 mg | Freq: Every day | INTRAMUSCULAR | Status: DC
  Administered 2024-03-08: 40 mg via INTRAVENOUS
  Filled 2024-03-07 (×2): qty 1

## 2024-03-07 MED ORDER — NOREPINEPHRINE 4 MG/250ML-% IV SOLN: 0.0000 ug/min | INTRAVENOUS | Status: DC

## 2024-03-07 MED ORDER — FAMOTIDINE 20 MG PO TABS
20.0000 mg | ORAL_TABLET | Freq: Two times a day (BID) | ORAL | Status: DC
  Administered 2024-03-07 – 2024-03-08 (×2): 20 mg
  Filled 2024-03-07 (×2): qty 1

## 2024-03-07 MED ORDER — INSULIN ASPART 100 UNIT/ML IJ SOLN
0.0000 [IU] | INTRAMUSCULAR | Status: DC
  Administered 2024-03-07: 2 [IU] via SUBCUTANEOUS
  Administered 2024-03-08: 1 [IU] via SUBCUTANEOUS
  Administered 2024-03-08: 2 [IU] via SUBCUTANEOUS
  Administered 2024-03-08: 3 [IU] via SUBCUTANEOUS
  Filled 2024-03-07: qty 3
  Filled 2024-03-07 (×3): qty 2

## 2024-03-07 MED ORDER — PROPOFOL 1000 MG/100ML IV EMUL
0.0000 ug/kg/min | INTRAVENOUS | Status: DC
  Filled 2024-03-07 (×2): qty 100

## 2024-03-07 MED ORDER — SODIUM CHLORIDE 0.9 % IV BOLUS
1000.0000 mL | Freq: Once | INTRAVENOUS | Status: AC
  Administered 2024-03-07: 1000 mL via INTRAVENOUS

## 2024-03-07 MED ORDER — IPRATROPIUM-ALBUTEROL 0.5-2.5 (3) MG/3ML IN SOLN
3.0000 mL | RESPIRATORY_TRACT | Status: DC
  Administered 2024-03-07 – 2024-03-08 (×5): 3 mL via RESPIRATORY_TRACT
  Filled 2024-03-07 (×5): qty 3

## 2024-03-07 MED ORDER — FENTANYL CITRATE (PF) 50 MCG/ML IJ SOSY: 50.0000 ug | PREFILLED_SYRINGE | INTRAMUSCULAR | Status: DC | PRN

## 2024-03-07 MED ORDER — POLYETHYLENE GLYCOL 3350 17 G PO PACK
17.0000 g | PACK | Freq: Every day | ORAL | Status: DC
  Administered 2024-03-08: 17 g
  Filled 2024-03-07: qty 1

## 2024-03-07 MED ORDER — POLYETHYLENE GLYCOL 3350 17 G PO PACK: 17.0000 g | PACK | Freq: Every day | ORAL | Status: DC | PRN

## 2024-03-07 MED ORDER — DOCUSATE SODIUM 50 MG/5ML PO LIQD
100.0000 mg | Freq: Two times a day (BID) | ORAL | Status: DC
  Administered 2024-03-07 – 2024-03-08 (×2): 100 mg
  Filled 2024-03-07 (×2): qty 10

## 2024-03-07 MED ORDER — ETOMIDATE 2 MG/ML IV SOLN
INTRAVENOUS | Status: AC
  Administered 2024-03-07: 30 mg via INTRAVENOUS
  Filled 2024-03-07: qty 20

## 2024-03-07 NOTE — ED Provider Notes (Signed)
 Procedure Name: Intubation Date/Time: 03/07/2024 4:28 PM  Performed by: Nicholaus Rolland BRAVO, MDPre-anesthesia Checklist: Patient identified, Patient being monitored, Emergency Drugs available, Timeout performed and Suction available Oxygen Delivery Method: Non-rebreather mask Preoxygenation: Pre-oxygenation with 100% oxygen Induction Type: Rapid sequence Ventilation: Mask ventilation without difficulty Laryngoscope Size: Glidescope Grade View: Grade I Tube size: 7.5 mm Number of attempts: 1 Airway Equipment and Method: Video-laryngoscopy Placement Confirmation: ETT inserted through vocal cords under direct vision, CO2 detector and Breath sounds checked- equal and bilateral Tube secured with: ETT holder Difficulty Due To: Difficulty was unanticipated Comments: Proceeded with no complications family updated.    .Critical Care  Performed by: Nicholaus Rolland BRAVO, MD Authorized by: Nicholaus Rolland BRAVO, MD   Critical care provider statement:    Critical care time (minutes):  30   Critical care was necessary to treat or prevent imminent or life-threatening deterioration of the following conditions:  Respiratory failure   Critical care was time spent personally by me on the following activities:  Development of treatment plan with patient or surrogate, discussions with consultants, evaluation of patient's response to treatment, examination of patient, ordering and review of laboratory studies, ordering and review of radiographic studies, ordering and performing treatments and interventions, pulse oximetry, re-evaluation of patient's condition and review of old charts Comments:     Management of vent and initiation of propofol  drip  .proc  Will call patient into the ICU, patient initiated on propofol , no alarms on the vent  Chest x-ray demonstrated the ET tube in good position.  ICU attending at bedside.   Nicholaus Rolland BRAVO, MD 03/07/24 [phone]

## 2024-03-07 NOTE — Progress Notes (Signed)
 PHARMACY CONSULT NOTE - ELECTROLYTES  Pharmacy Consult for Electrolyte Monitoring and Replacement   Recent Labs: Height: 5' 10 (177.8 cm) Weight: 90 kg (198 lb 6.6 oz) IBW/kg (Calculated) : 73 Estimated Creatinine Clearance: 99.8 mL/min (by C-G formula based on SCr of 0.64 mg/dL). Potassium (mmol/L)  Date Value  03/07/2024 5.3 (H)   Magnesium  (mg/dL)  Date Value  92/98/7974 2.2   Calcium  (mg/dL)  Date Value  87/95/7974 9.2   Albumin (g/dL)  Date Value  87/95/7974 4.2  04/27/2020 4.1   Phosphorus (mg/dL)  Date Value  92/98/7974 3.4   Sodium (mmol/L)  Date Value  03/07/2024 135  04/27/2020 138    Assessment  Stephen [name] is a 68 y.o. male presenting with acute respiratory failure with hypoxia and hypercarbia. PMH significant for advanced COPD, anxiety, CAD. Pharmacy has been consulted to monitor and replace electrolytes.  Diet: NPO MIVF: None Pertinent medications: None  Goal of Therapy: Electrolytes WNL  Plan:  No electrolyte replacement warranted at this time Check RFP and Mg with AM labs  Thank you for allowing pharmacy to be a part of this patient's care.   Ransom Blanch PGY-1 Pharmacy Resident  [place] [facility]  03/07/2024 4:59 PM

## 2024-03-07 NOTE — ED Provider Notes (Signed)
 [facility] Provider Note    Event Date/Time   First MD Initiated Contact with Patient 03/07/24 1310     (approximate)   History   Respiratory Distress   HPI  Stephen [name] is a 68 year old male with history of COPD presenting to the emergency department for evaluation of respiratory distress.  Per EMS report patient they were called out for shortness of breath.  On arrival patient was found with sats in the 70s, placed on 10 L with improvement.  Poor air movement.  No reported cough or chest pain.  Was noted to be somnolent.   Reviewed discharge summary from 11/01/2023.  At that time patient presented with respiratory failure.  Significant hypercarbia failure to improve on BiPAP, required intubation.  CTA without PE.  Physical Exam   Triage Vital Signs: ED Triage Vitals  Encounter Vitals Group     BP 03/07/24 1317 (!) 155/94     Girls Systolic BP Percentile --      Girls Diastolic BP Percentile --      Boys Systolic BP Percentile --      Boys Diastolic BP Percentile --      Pulse Rate 03/07/24 1317 (!) 103     Resp 03/07/24 1317 (!) 44     Temp 03/07/24 1317 98.4 F (36.9 C)     Temp Source 03/07/24 1317 Oral     SpO2 03/07/24 1317 92 %     Weight 03/07/24 1319 198 lb 6.6 oz (90 kg)     Height 03/07/24 1319 5' 10 (1.778 m)     Head Circumference --      Peak Flow --      Pain Score 03/07/24 1318 0     Pain Loc --      Pain Education --      Exclude from Growth Chart --     Most recent vital signs: Vitals:   03/07/24 1400 03/07/24 1430  BP: 119/67 121/75  Pulse: 94 89  Resp: 17 16  Temp:    SpO2: 94% 91%     General: Awake, interactive but somnolent CV:  Good peripheral perfusion Resp:  Poor air movement bilaterally, tachypneic with shallow respirations  Abd:  Nondistended.  Neuro:  Symmetric facial movement, fluid speech  ED Results / Procedures / Treatments   Labs (all labs ordered are listed, but only abnormal results are  displayed) Labs Reviewed  BLOOD GAS, VENOUS - Abnormal; Notable for the following components:      Result Value   pH, Ven 7.21 (*)    pCO2, Ven 105 (*)    pO2, Ven 51 (*)    Bicarbonate 42.0 (*)    Acid-Base Excess 9.7 (*)    All other components within normal limits  COMPREHENSIVE METABOLIC PANEL WITH GFR - Abnormal; Notable for the following components:   Potassium 5.3 (*)    Chloride 95 (*)    CO2 35 (*)    Glucose, Bld 129 (*)    AST 12 (*)    All other components within normal limits  CBC WITH DIFFERENTIAL/PLATELET - Abnormal; Notable for the following components:   MCV 102.5 (*)    All other components within normal limits  CULTURE, BLOOD (ROUTINE X 2)  CULTURE, BLOOD (ROUTINE X 2)  RESP PANEL BY RT-PCR (RSV, FLU A&B, COVID)  RVPGX2  LACTIC ACID, PLASMA  BLOOD GAS, VENOUS     EKG EKG independently reviewed and interpreted by myself demonstrates:  EKG demonstrate  sinus tachycardia at a rate of 104, PR 142, QRS 92, QTc 431, no acute ST changes  RADIOLOGY Imaging independently reviewed and interpreted by myself demonstrates:  CXR without focal consolidation  Formal Radiology Read:  [facility] Chest Port 1 View Result Date: 03/07/2024 CLINICAL DATA:  Possible sepsis. EXAM: PORTABLE CHEST 1 VIEW COMPARISON:  10/01/2023 FINDINGS: Lungs are adequately inflated as patient is slightly rotated to the right. No lobar consolidation or effusion. No pneumothorax. Cardiomediastinal silhouette and remainder of the exam is unchanged. IMPRESSION: No acute cardiopulmonary disease. Electronically Signed   By: Toribio Agreste M.D.   On: 03/07/2024 14:16    PROCEDURES:  Critical Care performed: No  Procedures   MEDICATIONS ORDERED IN ED: Medications  rocuronium  (ZEMURON ) 100 MG/10ML injection (has no administration in time range)  etomidate  (AMIDATE ) 2 MG/ML injection (has no administration in time range)  ipratropium-albuterol  (DUONEB) 0.5-2.5 (3) MG/3ML nebulizer solution 6 mL (6 mLs  Nebulization Given 03/07/24 1349)  methylPREDNISolone  sodium succinate (SOLU-MEDROL ) 125 mg/2 mL injection 125 mg (125 mg Intravenous Given 03/07/24 1350)  ipratropium-albuterol  (DUONEB) 0.5-2.5 (3) MG/3ML nebulizer solution 6 mL (6 mLs Nebulization Given 03/07/24 1502)     IMPRESSION / MDM / ASSESSMENT AND PLAN / ED COURSE  I reviewed the triage vital signs and the nursing notes.  Differential diagnosis includes, but is not limited to, COPD exacerbation, pneumonia, viral illness, lower suspicion ACS, PE given abnormal lung sounds  Patient's presentation is most consistent with acute presentation with potential threat to life or bodily function.  68 year old male presenting in respiratory distress with hypoxia.  Very poor air movement on presentation.  VBG obtained which did demonstrate significant hypercarbia with a pCO2 of 105, acidosis with pH of 7.21.  CBC with normal white blood cell count.  Patient denies recent cough.  CMP without critical derangements, elevated bicarb likely reflective of chronic hypercarbia.  Normal lactate.  Blood culture sent.  Chest x-Draken Farrior no focal consolidation.  In the absence of obvious infectious source, have deferred antibiotics for now.  Patient placed on BiPAP as below.  Given prior failure on BiPAP will obtain repeat VBG.    Clinical Course as of 03/07/24 1619  Thu Mar 07, 2024  1340 Notified by RN that patient's VBG demonstrated pH of 7.21 with pCO2 of greater than 100.  Will start patient on BiPAP.  [NR]  1515 Notified by respiratory therapist that repeat VBG demonstrated worsening acidosis and hypercarbia despite BiPAP.  Signed out to oncoming physician pending reevaluation.  Suspect patient may require intubation and ICU admission given his worsening status. [NR]  1559 Received signout: COPD refractory to bipap. Admit to icu  [HD]    Clinical Course User Index [HD] Nicholaus Rolland BRAVO, MD [NR] Levander Slate, MD     FINAL CLINICAL IMPRESSION(S) / ED DIAGNOSES    Final diagnoses:  COPD exacerbation (HCC)  Acute respiratory failure with hypoxia and hypercapnia (HCC)     Rx / DC Orders   ED Discharge Orders     None        Note:  This document was prepared using Dragon voice recognition software and may include unintentional dictation errors.   Levander Slate, MD 03/07/24 [phone]

## 2024-03-07 NOTE — Progress Notes (Signed)
 RT assisted with patient transport from ER to ICU room 11 with no complications. Pt transported on Servo-air Ventilator.

## 2024-03-07 NOTE — IPAL (Signed)
 GOALS OF CARE FAMILY CONFERENCE   I met with patient's son Stephen [name].  He reports patient has become very depressed after losing his wife. He still smokes heavily despite having advanced COPD.  His O2 levels at baseline are low in the 80s at rest per son.  [name] shares that patient has already been intubated and states he wants to be DNR does not wish to have CPR or HD.  He also requests to have mechanical ventilation be discontinued after maximum of 2 wks.    We reviewed current hypoxemic hypercapnic respiratory failure and his previus medical history.  We discussed COPD carepath and current need for ICU admission due to critical illness.   Patient remains unresponsive acutely comatose    Patient is unable to breathe independently, unable to protect airway and unable to mobilize secretions.   Family is appreciative of care and relate understanding that patient is severely critically ill with anticipation of passing away during this hospitalization.   They have consented and agreed to DNR  Code status   Family are satisfied with Plan of action and management. All questions answered  Additional Critical Care time 35 mins    Halina Picking, M.D.  Pulmonary & Critical Care Medicine  [facility]

## 2024-03-07 NOTE — ED Triage Notes (Signed)
 Patient to ED via [facility] from [facility] where patient currently lives. Patient called EMS due to respiratory distress that began a couple days ago. Upon EMS arrival, patient sats were 70% on room air. Patient was placed on 10L [place] which brought patient up to 100%. Patient has Hx of COPD. Patient does not normally wear oxygen. Patient denies cough and CP. Patient currently A&Ox4, but lethargic.

## 2024-03-07 NOTE — Progress Notes (Signed)
RT assisted with intubation

## 2024-03-07 NOTE — H&P (Signed)
 NAME:  Stephen [name], MRN:  [id number], DOB:  Oct 18, 1955, LOS: 0 ADMISSION DATE:  03/07/2024, CONSULTATION DATE:  03/07/2024 REFERRING MD:  Dr. Nicholaus, CHIEF COMPLAINT:  Acute Respiratory Distress   Brief Pt Description / Synopsis:  68 y.o. male with PMHx significant for COPD admitted with Acute Hypoxic and Hypercapnic Respiratory Failure due to Acute COPD Exacerbation, failed trial of BiPAP requiring intubation and mechanical ventilation.   History of Present Illness:  Stephen [name] is a 68 year old male with past medical history significant for COPD who presented to [facility] ED on 12//25 due to acute respiratory distress.  Patient is currently intubated and sedated and no family currently available, therefore history is obtained from chart review.  It is reported that he had not been feeling well for the past few days.  Today EMS was dispatched due to shortness of breath, and upon their arrival he was noted to be hypoxic with O2 saturations in the 70s on room air along with poor air movement.  He was noted to be somnolent, but able to arouse.  He denied chest pain, cough, fever, chills.  On arrival to the ED he remained somnolent but interactive, along with tachypnea and shallow respirations at which he was placed on BiPAP.  Despite BiPAP therapy, ABG exhibited worsening respiratory acidosis ultimately requiring intubation.  Of note he required admission in July 2025 due to acute hypercapnic respiratory failure due to acute COPD exacerbation, which he failed BiPAP requiring intubation at that time.  CTA was negative for PE   ED Course: Initial Vital Signs: Temperature 98.4 F orally, pulse 103, respiratory rate 44, blood pressure 155/94, SpO2 92% Significant Labs: Potassium 5.3, chloride 95, bicarb 35, glucose 129 VBG: pH 7.21/pCO2 105/pO2 51/bicarb 42 ~follow-up VBG on BiPAP: pH 7.16/pCO2 116/pO2 45/bicarb 41.3 Imaging Chest X-ray>>IMPRESSION: No acute cardiopulmonary disease. Medications  Administered: duo-nebs, 125 mg Solumedrol   PCCM asked to admit for further workup and treatment.  Please see Significant Hospital Events section below for full detailed hospital course.   Pertinent  Medical History   Past Medical History:  Diagnosis Date   Allergy    seasonal   Anxiety    CAD in native artery    COPD (chronic obstructive pulmonary disease) (HCC)    Thoracic aortic atherosclerosis     Micro Data:  12/4: COVID/flu/RSV PCR>> negative 12/4: RVP>> 12/4: Blood cultures x2>> 12/4: Tracheal aspirate>>  Antimicrobials:   Anti-infectives (From admission, onward)    Start     Dose/Rate Route Frequency Ordered Stop   03/07/24 1700  azithromycin  (ZITHROMAX ) 500 mg in sodium chloride  0.9 % 250 mL IVPB        500 mg 250 mL/hr over 60 Minutes Intravenous Every 24 hours 03/07/24 1637 03/10/24 1659       Significant Hospital Events: Including procedures, antibiotic start and stop dates in addition to other pertinent events   12/4: Presented to ED with Acute Respiratory Distress, placed on BiPAP.  Somnolent, with worsening respiratory acidosis despite BiPAP requiring intubation and mechanical ventilation.  PCCM asked to admit.   Interim History / Subjective:  As outlined above under Significant Hospital Events section  Objective   Blood pressure 121/75, pulse 89, temperature 98.4 F (36.9 C), temperature source Oral, resp. rate 16, height 5' 10 (1.778 m), weight 90 kg, SpO2 91%.       No intake or output data in the 24 hours ending 03/07/24 1645 Filed Weights   03/07/24 1319  Weight: 90 kg  Examination: General: Critically on chronically ill-appearing male, laying in bed, intubated and sedated, no acute distress HENT: Atraumatic, normocephalic, neck supple, no JVD, orally intubated Lungs: Diminished breath sounds throughout, even, nonlabored, synchronous with the vent Cardiovascular: Tachycardic, regular rhythm, S1-S2, no murmurs, rubs,  gallops Abdomen: Soft, nontender, nondistended, no guarding or rebound tenderness, bowel sounds positive x 4 Extremities: Normal bulk and tone, no deformities, no edema, warm and well-perfused Neuro: Sedated, currently does not withdraw from painful stimuli or follow commands, pupils PERRLA GU: Foley catheter in place draining yellow urine  Resolved Hospital Problem list     Assessment & Plan:   #Acute Hypoxic & Hypercapnic Respiratory Failure due to ... #Acute COPD Exacerbation PMHx: COPD -Full vent support, implement lung protective strategies -Plateau pressures less than 30 cm H20 -Wean FiO2 & PEEP as tolerated to maintain O2 sats 88 to 92% -Follow intermittent Chest X-ray & ABG as needed -Spontaneous Breathing Trials when respiratory parameters met and mental status permits -Implement VAP Bundle -Bronchodilators & Pulmicort  nebs -IV Steroids -ABX as above -Aggressive pulmonary toilet as able   #Mild Hyperkalemia -Monitor I&O's / urinary output -Follow BMP -Ensure adequate renal perfusion -Avoid nephrotoxic agents as able -Replace electrolytes as indicated ~ Pharmacy following for assistance with electrolyte replacement  #Hyperglycemia -CBG's q4h; Target range of 140 to 180 -SSI -Follow ICU Hypo/Hyperglycemia protocol  #Acute Metabolic Encephalopathy due to CO2 Narcosis #Sedation needs in setting of mechanical ventilation  PMHx: Anxiety  -Treatment of metabolic derangements and hypercapnia as outlined above -Maintain a RASS goal of 0 to -1 -Fentanyl  and Propofol  to maintain RASS goal -Avoid sedating medications as able -Daily wake up assessment -Check Urinalysis and UDS        Best Practice (right click and Reselect all SmartList Selections daily)   Diet/type: NPO DVT prophylaxis: prophylactic heparin  GI prophylaxis: PPI Lines: N/A Foley:  Yes, and it is still needed Code Status:  DNR (see Dr. Braxton IPAL note) Last date of multidisciplinary  goals of care discussion [N/A]  12/4: Will update pt's family when they arrive at bedside on plan of care.  Labs   CBC: Recent Labs  Lab 03/07/24 1318  WBC 8.2  NEUTROABS 6.4  HGB 15.4  HCT 48.8  MCV 102.5*  PLT 242    Basic Metabolic Panel: Recent Labs  Lab 03/07/24 1318  NA 135  K 5.3*  CL 95*  CO2 35*  GLUCOSE 129*  BUN 13  CREATININE 0.64  CALCIUM  9.2   GFR: Estimated Creatinine Clearance: 99.8 mL/min (by C-G formula based on SCr of 0.64 mg/dL). Recent Labs  Lab 03/07/24 1318  WBC 8.2  LATICACIDVEN 0.7    Liver Function Tests: Recent Labs  Lab 03/07/24 1318  AST 12*  ALT 8  ALKPHOS 82  BILITOT 0.4  PROT 7.2  ALBUMIN 4.2   No results for input(s): LIPASE, AMYLASE in the last 168 hours. No results for input(s): AMMONIA in the last 168 hours.  ABG    Component Value Date/Time   PHART 7.36 10/01/2023 1348   PCO2ART 62 (H) 10/01/2023 1348   PO2ART 87 10/01/2023 1348   HCO3 42.0 (H) 03/07/2024 1318   O2SAT 82.5 03/07/2024 1318     Coagulation Profile: No results for input(s): INR, PROTIME in the last 168 hours.  Cardiac Enzymes: No results for input(s): CKTOTAL, CKMB, CKMBINDEX, TROPONINI in the last 168 hours.  HbA1C: Hgb A1c MFr Bld  Date/Time Value Ref Range Status  08/09/2022 04:01 PM 6.5 (H) <5.7 %  of total Hgb Final    Comment:    For someone without known diabetes, a hemoglobin A1c value of 6.5% or greater indicates that they may have  diabetes and this should be confirmed with a follow-up  test. . For someone with known diabetes, a value <7% indicates  that their diabetes is well controlled and a value  greater than or equal to 7% indicates suboptimal  control. A1c targets should be individualized based on  duration of diabetes, age, comorbid conditions, and  other considerations. . Currently, no consensus exists regarding use of hemoglobin A1c for diagnosis of diabetes for children. SABRA   08/25/2021 10:46  AM 5.8 (H) <5.7 % of total Hgb Final    Comment:    For someone without known diabetes, a hemoglobin  A1c value between 5.7% and 6.4% is consistent with prediabetes and should be confirmed with a  follow-up test. . For someone with known diabetes, a value <7% indicates that their diabetes is well controlled. A1c targets should be individualized based on duration of diabetes, age, comorbid conditions, and other considerations. . This assay result is consistent with an increased risk of diabetes. . Currently, no consensus exists regarding use of hemoglobin A1c for diagnosis of diabetes for children. .     CBG: No results for input(s): GLUCAP in the last 168 hours.  Review of Systems:   Unable to assess due to intubation/sedation/critical illness   Past Medical History:  He,  has a past medical history of Allergy, Anxiety, CAD in native artery, COPD (chronic obstructive pulmonary disease) (HCC), and Thoracic aortic atherosclerosis.   Surgical History:  No past surgical history on file.   Social History:   reports that he has been smoking cigarettes. He has a 45 pack-year smoking history. He has never used smokeless tobacco. He reports that he does not drink alcohol and does not use drugs.   Family History:  His family history includes Alzheimer's disease in his brother and father; Hypertension in his mother; Hypothyroidism in his mother.   Allergies No Known Allergies   Home Medications  Prior to Admission medications   Medication Sig Start Date End Date Taking? Authorizing Provider  albuterol  (VENTOLIN  HFA) 108 (90 Base) MCG/ACT inhaler INHALE 2 PUFFS INTO THE LUNGS EVERY 6 HOURS AS NEEDED FOR WHEEZING ORSHORTNESS OF BREATH 08/25/21  Yes Sowles, Krichna, MD  BREZTRI  AEROSPHERE 160-9-4.8 MCG/ACT AERO inhaler Inhale 2 puffs into the lungs 2 (two) times daily. 07/20/23 07/19/24 Yes [provider]  clonazePAM  (KLONOPIN ) 1 MG tablet Take 0.5-1 mg by mouth 2 (two)  times daily as needed for anxiety.   Yes [provider]  Fluticasone-Umeclidin-Vilant (TRELEGY ELLIPTA ) 100-62.5-25 MCG/ACT AEPB Inhale 1 puff into the lungs daily. 12/14/22  Yes Sowles, Krichna, MD  ipratropium-albuterol  (DUONEB) 0.5-2.5 (3) MG/3ML SOLN Inhale 3 mLs into the lungs 3 (three) times daily. 06/26/23 06/20/24 Yes [provider]  Methylcobalamin (B-12) 1000 MCG TBDP PLACE 1 TABLET UNDER THE TONGUE DAILY 08/15/22  Yes Sowles, Krichna, MD  nicotine  (NICODERM CQ  - DOSED IN MG/24 HOURS) 21 mg/24hr patch Place 1 patch (21 mg total) onto the skin daily. 10/06/23  Yes Wouk, Devaughn Sayres, MD  roflumilast (DALIRESP) 500 MCG TABS tablet Take 500 mcg by mouth daily.   Yes [provider]  tamsulosin  (FLOMAX ) 0.4 MG CAPS capsule Take 2 capsules (0.8 mg total) by mouth daily. 10/05/23  Yes Wouk, Devaughn Sayres, MD  torsemide (DEMADEX) 5 MG tablet Take 5 mg by mouth daily as  needed. 10/24/23 10/23/24 Yes [provider]  aspirin  EC 81 MG tablet Take 1 tablet (81 mg total) by mouth daily. Swallow whole. Patient not taking: Reported on 10/01/2023 08/25/21   Sowles, Krichna, MD  buPROPion  (WELLBUTRIN  XL) 150 MG 24 hr tablet Take 150 mg by mouth daily. Patient not taking: Reported on 03/07/2024 05/16/23   [provider]  rosuvastatin  (CRESTOR ) 10 MG tablet Take 1 tablet (10 mg total) by mouth daily. Patient not taking: Reported on 10/01/2023 12/14/22   Sowles, Krichna, MD  traZODone (DESYREL) 100 MG tablet Take 100 mg by mouth at bedtime. Patient not taking: Reported on 03/07/2024 07/13/23   [provider]     Critical care time: 60 minutes     Inge Lecher, AGACNP-BC [facility] Prefer epic messenger for cross cover needs If after hours, please call E-link

## 2024-03-07 NOTE — ED Notes (Signed)
 Fall precautions put into place.

## 2024-03-08 ENCOUNTER — Inpatient Hospital Stay

## 2024-03-08 DIAGNOSIS — J9601 Acute respiratory failure with hypoxia: Secondary | ICD-10-CM | POA: Diagnosis not present

## 2024-03-08 DIAGNOSIS — J432 Centrilobular emphysema: Secondary | ICD-10-CM | POA: Diagnosis not present

## 2024-03-08 DIAGNOSIS — Z4682 Encounter for fitting and adjustment of non-vascular catheter: Secondary | ICD-10-CM | POA: Diagnosis not present

## 2024-03-08 DIAGNOSIS — J9602 Acute respiratory failure with hypercapnia: Secondary | ICD-10-CM | POA: Diagnosis not present

## 2024-03-08 LAB — CBC
HCT: 41.5 % (ref 39.0–52.0)
Hemoglobin: 13.5 g/dL (ref 13.0–17.0)
MCH: 31.8 pg (ref 26.0–34.0)
MCHC: 32.5 g/dL (ref 30.0–36.0)
MCV: 97.6 fL (ref 80.0–100.0)
Platelets: 197 K/uL (ref 150–400)
RBC: 4.25 MIL/uL (ref 4.22–5.81)
RDW: 13.1 % (ref 11.5–15.5)
WBC: 4.1 K/uL (ref 4.0–10.5)
nRBC: 0 % (ref 0.0–0.2)

## 2024-03-08 LAB — RESPIRATORY PANEL BY PCR

## 2024-03-08 LAB — HEMOGLOBIN A1C
Hgb A1c MFr Bld: 5.8 % — ABNORMAL HIGH (ref 4.8–5.6)
Mean Plasma Glucose: 120 mg/dL

## 2024-03-08 LAB — RENAL FUNCTION PANEL
Albumin: 3.4 g/dL — ABNORMAL LOW (ref 3.5–5.0)
Anion gap: 8 (ref 5–15)
BUN: 21 mg/dL (ref 8–23)
CO2: 29 mmol/L (ref 22–32)
Calcium: 8.7 mg/dL — ABNORMAL LOW (ref 8.9–10.3)
Chloride: 96 mmol/L — ABNORMAL LOW (ref 98–111)
Creatinine, Ser: 0.84 mg/dL (ref 0.61–1.24)
GFR, Estimated: >60 mL/min (ref >60)
Glucose, Bld: 121 mg/dL — ABNORMAL HIGH (ref 70–99)
Phosphorus: 3.1 mg/dL (ref 2.5–4.6)
Potassium: 5.3 mmol/L — ABNORMAL HIGH (ref 3.5–5.1)
Sodium: 134 mmol/L — ABNORMAL LOW (ref 135–145)

## 2024-03-08 LAB — GLUCOSE, CAPILLARY
Glucose-Capillary: 122 mg/dL — ABNORMAL HIGH (ref 70–99)
Glucose-Capillary: 94 mg/dL (ref 70–99)
Glucose-Capillary: 109 mg/dL — ABNORMAL HIGH (ref 70–99)
Glucose-Capillary: 137 mg/dL — ABNORMAL HIGH (ref 70–99)

## 2024-03-08 LAB — MAGNESIUM: Magnesium: 1.9 mg/dL (ref 1.7–2.4)

## 2024-03-08 LAB — BLOOD GAS, ARTERIAL
Acid-Base Excess: 6.3 mmol/L — ABNORMAL HIGH (ref 0.0–2.0)
Bicarbonate: 33.1 mmol/L — ABNORMAL HIGH (ref 20.0–28.0)
FIO2: 30 %
MECHVT: 550 mL
O2 Saturation: 93.8 %
PEEP: 8 cmH2O
Patient temperature: 37
RATE: 18 {breaths}/min
pCO2 arterial: 56 mmHg — ABNORMAL HIGH (ref 32–48)
pH, Arterial: 7.38 (ref 7.35–7.45)
pO2, Arterial: 64 mmHg — ABNORMAL LOW (ref 83–108)

## 2024-03-08 LAB — TRIGLYCERIDES: Triglycerides: 55 mg/dL (ref <150)

## 2024-03-08 MED ORDER — EYE WASH OP SOLN
1.0000 [drp] | OPHTHALMIC | Status: DC | PRN
  Administered 2024-03-08 – 2024-03-09 (×2): 1 [drp] via OPHTHALMIC
  Filled 2024-03-08: qty 118

## 2024-03-08 MED ORDER — DEXMEDETOMIDINE HCL IN NACL 400 MCG/100ML IV SOLN
0.0000 ug/kg/h | INTRAVENOUS | Status: DC
  Administered 2024-03-08: 0.4 ug/kg/h via INTRAVENOUS
  Filled 2024-03-08: qty 100

## 2024-03-08 MED ORDER — POLYETHYLENE GLYCOL 3350 17 G PO PACK: 17.0000 g | PACK | Freq: Every day | ORAL | Status: DC | PRN

## 2024-03-08 MED ORDER — LACTATED RINGERS IV BOLUS
500.0000 mL | Freq: Once | INTRAVENOUS | Status: AC
  Administered 2024-03-08: 500 mL via INTRAVENOUS

## 2024-03-08 MED ORDER — ORAL CARE MOUTH RINSE: 15.0000 mL | OROMUCOSAL | Status: DC | PRN

## 2024-03-08 MED ORDER — SODIUM CHLORIDE (HYPERTONIC) 2 % OP SOLN
1.0000 [drp] | OPHTHALMIC | Status: DC | PRN
  Filled 2024-03-08: qty 15

## 2024-03-08 MED ORDER — DOCUSATE SODIUM 100 MG PO CAPS: 100.0000 mg | ORAL_CAPSULE | Freq: Two times a day (BID) | ORAL | Status: DC | PRN

## 2024-03-08 MED ORDER — GLYCOPYRROLATE 1 MG PO TABS
1.0000 mg | ORAL_TABLET | Freq: Three times a day (TID) | ORAL | Status: DC
  Administered 2024-03-08: 1 mg
  Filled 2024-03-08 (×2): qty 1

## 2024-03-08 MED ORDER — GLYCOPYRROLATE 0.2 MG/ML IJ SOLN
0.2000 mg | Freq: Once | INTRAMUSCULAR | Status: AC
  Administered 2024-03-08: 0.2 mg via INTRAVENOUS
  Filled 2024-03-08: qty 1

## 2024-03-08 MED ORDER — GLYCOPYRROLATE 1 MG PO TABS
1.0000 mg | ORAL_TABLET | Freq: Three times a day (TID) | ORAL | Status: DC
  Administered 2024-03-08: 1 mg via ORAL
  Filled 2024-03-08 (×3): qty 1

## 2024-03-08 MED ORDER — FUROSEMIDE 10 MG/ML IJ SOLN
40.0000 mg | Freq: Once | INTRAMUSCULAR | Status: AC
  Administered 2024-03-08: 40 mg via INTRAVENOUS
  Filled 2024-03-08: qty 4

## 2024-03-08 MED ORDER — CLONAZEPAM 1 MG PO TABS
1.0000 mg | ORAL_TABLET | Freq: Every day | ORAL | Status: DC
  Administered 2024-03-08 – 2024-03-09 (×2): 1 mg via ORAL
  Filled 2024-03-08 (×2): qty 1

## 2024-03-08 MED ORDER — DIAZEPAM 5 MG/ML IJ SOLN: 5.0000 mg | Freq: Four times a day (QID) | INTRAMUSCULAR | Status: DC | PRN

## 2024-03-08 MED ORDER — IPRATROPIUM-ALBUTEROL 0.5-2.5 (3) MG/3ML IN SOLN
3.0000 mL | Freq: Three times a day (TID) | RESPIRATORY_TRACT | Status: DC
  Administered 2024-03-08 – 2024-03-09 (×2): 3 mL via RESPIRATORY_TRACT
  Filled 2024-03-08 (×2): qty 3

## 2024-03-08 NOTE — Progress Notes (Signed)
   03/08/24 0930  Spiritual Encounters  Type of Visit Initial  Care provided to: Family (Met Son in the ICU waiting room)  Referral source Chaplain assessment  Reason for visit Routine spiritual support  OnCall Visit No  Spiritual Framework  Presenting Themes Meaning/purpose/sources of inspiration;Goals in life/care;Values and beliefs;Impactful experiences and emotions;Other (comment) (for Son)  Family Stress Factors Loss;Other (Comment) (Pt's son stated that the Pt just lost his wife last Thanksgiving and a son in the recent past. The family has had many struggles and loss.)  Interventions  Spiritual Care Interventions Made Established relationship of care and support;Compassionate presence;Reflective listening;Normalization of emotions  Intervention Outcomes  Outcomes Awareness around self/spiritual resourses;Awareness of support

## 2024-03-08 NOTE — Progress Notes (Signed)
 Patient extubated to 2 LPM nasal cannula per MD order with no complications.  O2 saturation and vital signs remained stable throughout. Patient was able to speak and cough after extubation.

## 2024-03-08 NOTE — Plan of Care (Signed)
 Patient alert, confusion to time and situation. Extubated this morning. Patient is on 2 liters of oxygen. Tolerating clear liquid diet. Foley intact with good urine output.

## 2024-03-08 NOTE — Progress Notes (Signed)
 Pt transported to 215.

## 2024-03-08 NOTE — Progress Notes (Signed)
 NAMEArmany [name], MRN:  [id number], DOB:  Mar 16, 1956, LOS: 1 ADMISSION DATE:  03/07/2024, CONSULTATION DATE:  03/07/2024 REFERRING MD:  Dr. Nicholaus, CHIEF COMPLAINT:  Acute Respiratory Distress   Brief Pt Description / Synopsis:  68 y.o. male with PMHx significant for COPD admitted with Acute Hypoxic and Hypercapnic Respiratory Failure due to Acute COPD Exacerbation, failed trial of BiPAP requiring intubation and mechanical ventilation.   History of Present Illness:  Stephen [name] is a 68 year old male with past medical history significant for COPD who presented to [facility] ED on 12//25 due to acute respiratory distress.  Patient is currently intubated and sedated and no family currently available, therefore history is obtained from chart review.  It is reported that he had not been feeling well for the past few days.  Today EMS was dispatched due to shortness of breath, and upon their arrival he was noted to be hypoxic with O2 saturations in the 70s on room air along with poor air movement.  He was noted to be somnolent, but able to arouse.  He denied chest pain, cough, fever, chills.  On arrival to the ED he remained somnolent but interactive, along with tachypnea and shallow respirations at which he was placed on BiPAP.  Despite BiPAP therapy, ABG exhibited worsening respiratory acidosis ultimately requiring intubation.  Of note he required admission in July 2025 due to acute hypercapnic respiratory failure due to acute COPD exacerbation, which he failed BiPAP requiring intubation at that time.  CTA was negative for PE   ED Course: Initial Vital Signs: Temperature 98.4 F orally, pulse 103, respiratory rate 44, blood pressure 155/94, SpO2 92% Significant Labs: Potassium 5.3, chloride 95, bicarb 35, glucose 129 VBG: pH 7.21/pCO2 105/pO2 51/bicarb 42 ~follow-up VBG on BiPAP: pH 7.16/pCO2 116/pO2 45/bicarb 41.3 Imaging Chest X-ray>>IMPRESSION: No acute cardiopulmonary disease. Medications  Administered: duo-nebs, 125 mg Solumedrol   PCCM asked to admit for further workup and treatment.  Please see Significant Hospital Events section below for full detailed hospital course.  03/08/24- patient passed SBT today and has been liberated off MV.   Pertinent  Medical History   Past Medical History:  Diagnosis Date   Allergy    seasonal   Anxiety    CAD in native artery    COPD (chronic obstructive pulmonary disease) (HCC)    Thoracic aortic atherosclerosis     Micro Data:  12/4: COVID/flu/RSV PCR>> negative 12/4: RVP>> 12/4: Blood cultures x2>> 12/4: Tracheal aspirate>>  Antimicrobials:   Anti-infectives (From admission, onward)    Start     Dose/Rate Route Frequency Ordered Stop   03/07/24 1700  azithromycin  (ZITHROMAX ) 500 mg in sodium chloride  0.9 % 250 mL IVPB        500 mg 250 mL/hr over 60 Minutes Intravenous Every 24 hours 03/07/24 1637 03/10/24 1659       Significant Hospital Events: Including procedures, antibiotic start and stop dates in addition to other pertinent events   12/4: Presented to ED with Acute Respiratory Distress, placed on BiPAP.  Somnolent, with worsening respiratory acidosis despite BiPAP requiring intubation and mechanical ventilation.  PCCM asked to admit.   Interim History / Subjective:  As outlined above under Significant Hospital Events section  Objective   Blood pressure 138/69, pulse (!) 58, temperature (!) 97.2 F (36.2 C), resp. rate (!) 26, height 5' 10 (1.778 m), weight 85.3 kg, SpO2 92%.    Vent Mode: PRVC FiO2 (%):  [30 %-40 %] 30 % Set Rate:  [18 bmp]  18 bmp Vt Set:  [500 mL-550 mL] 550 mL PEEP:  [5 cmH20-8 cmH20] 5 cmH20 Plateau Pressure:  [16 cmH20-17 cmH20] 17 cmH20   Intake/Output Summary (Last 24 hours) at 03/08/2024 1428 Last data filed at 03/08/2024 1237 Gross per 24 hour  Intake 3233.13 ml  Output 740 ml  Net 2493.13 ml   Filed Weights   03/07/24 1319 03/08/24 0500  Weight: 90 kg 85.3 kg     Examination: General: Critically on chronically ill-appearing male, laying in bed, intubated and sedated, no acute distress HENT: Atraumatic, normocephalic, neck supple, no JVD, orally intubated Lungs: Diminished breath sounds throughout, even, nonlabored, synchronous with the vent Cardiovascular: Tachycardic, regular rhythm, S1-S2, no murmurs, rubs, gallops Abdomen: Soft, nontender, nondistended, no guarding or rebound tenderness, bowel sounds positive x 4 Extremities: Normal bulk and tone, no deformities, no edema, warm and well-perfused Neuro: Sedated, currently does not withdraw from painful stimuli or follow commands, pupils PERRLA GU: Foley catheter in place draining yellow urine  Resolved Hospital Problem list     Assessment & Plan:   #Acute Hypoxic & Hypercapnic Respiratory Failure due to ... #Acute COPD Exacerbation PMHx: COPD -s/p extubation   #Mild Hyperkalemia -Monitor I&O's / urinary output -Follow BMP -Ensure adequate renal perfusion -Avoid nephrotoxic agents as able -Replace electrolytes as indicated ~ Pharmacy following for assistance with electrolyte replacement  #Hyperglycemia -CBG's q4h; Target range of 140 to 180 -SSI -Follow ICU Hypo/Hyperglycemia protocol  #Acute Metabolic Encephalopathy due to CO2 Narcosis-RESOLVED     Best Practice (right click and Reselect all SmartList Selections daily)   Diet/type: NPO DVT prophylaxis: prophylactic heparin   GI prophylaxis: PPI Lines: N/A Foley:  Yes, and it is still needed Code Status:  DNR (see Dr. Braxton IPAL note) Last date of multidisciplinary goals of care discussion [N/A]   Labs   CBC: Recent Labs  Lab 03/07/24 1318 03/08/24 0321  WBC 8.2 4.1  NEUTROABS 6.4  --   HGB 15.4 13.5  HCT 48.8 41.5  MCV 102.5* 97.6  PLT 242 197    Basic Metabolic Panel: Recent Labs  Lab 03/07/24 1318 03/07/24 2148 03/08/24 0321  NA 135  --  134*  K 5.3* 5.1 5.3*  CL 95*  --  96*  CO2 35*  --   29  GLUCOSE 129*  --  121*  BUN 13  --  21  CREATININE 0.64  --  0.84  CALCIUM  9.2  --  8.7*  MG  --   --  1.9  PHOS  --   --  3.1   GFR: Estimated Creatinine Clearance: 86.9 mL/min (by C-G formula based on SCr of 0.84 mg/dL). Recent Labs  Lab 03/07/24 1318 03/07/24 1759 03/07/24 2148 03/08/24 0321  PROCALCITON  --  <0.10  --   --   WBC 8.2  --   --  4.1  LATICACIDVEN 0.7  --  1.4  --     Liver Function Tests: Recent Labs  Lab 03/07/24 1318 03/08/24 0321  AST 12*  --   ALT 8  --   ALKPHOS 82  --   BILITOT 0.4  --   PROT 7.2  --   ALBUMIN 4.2 3.4*   No results for input(s): LIPASE, AMYLASE in the last 168 hours. No results for input(s): AMMONIA in the last 168 hours.  ABG    Component Value Date/Time   PHART 7.38 03/08/2024 0401   PCO2ART 56 (H) 03/08/2024 0401   PO2ART 64 (L) 03/08/2024 0401  HCO3 33.1 (H) 03/08/2024 0401   O2SAT 93.8 03/08/2024 0401     Coagulation Profile: No results for input(s): INR, PROTIME in the last 168 hours.  Cardiac Enzymes: No results for input(s): CKTOTAL, CKMB, CKMBINDEX, TROPONINI in the last 168 hours.  HbA1C: Hgb A1c MFr Bld  Date/Time Value Ref Range Status  08/09/2022 04:01 PM 6.5 (H) <5.7 % of total Hgb Final    Comment:    For someone without known diabetes, a hemoglobin A1c value of 6.5% or greater indicates that they may have  diabetes and this should be confirmed with a follow-up  test. . For someone with known diabetes, a value <7% indicates  that their diabetes is well controlled and a value  greater than or equal to 7% indicates suboptimal  control. A1c targets should be individualized based on  duration of diabetes, age, comorbid conditions, and  other considerations. . Currently, no consensus exists regarding use of hemoglobin A1c for diagnosis of diabetes for children. SABRA   08/25/2021 10:46 AM 5.8 (H) <5.7 % of total Hgb Final    Comment:    For someone without known diabetes,  a hemoglobin  A1c value between 5.7% and 6.4% is consistent with prediabetes and should be confirmed with a  follow-up test. . For someone with known diabetes, a value <7% indicates that their diabetes is well controlled. A1c targets should be individualized based on duration of diabetes, age, comorbid conditions, and other considerations. . This assay result is consistent with an increased risk of diabetes. . Currently, no consensus exists regarding use of hemoglobin A1c for diagnosis of diabetes for children. .     CBG: Recent Labs  Lab 03/07/24 1931 03/07/24 2344 03/08/24 0349 03/08/24 0740 03/08/24 1116  GLUCAP 130* 120* 122* 94 109*    Review of Systems:   Unable to assess due to intubation/sedation/critical illness   Past Medical History:  He,  has a past medical history of Allergy, Anxiety, CAD in native artery, COPD (chronic obstructive pulmonary disease) (HCC), and Thoracic aortic atherosclerosis.   Surgical History:  No past surgical history on file.   Social History:   reports that he has been smoking cigarettes. He has a 45 pack-year smoking history. He has never used smokeless tobacco. He reports that he does not drink alcohol and does not use drugs.   Family History:  His family history includes Alzheimer's disease in his brother and father; Hypertension in his mother; Hypothyroidism in his mother.   Allergies No Known Allergies   Home Medications  Prior to Admission medications   Medication Sig Start Date End Date Taking? Authorizing Provider  albuterol  (VENTOLIN  HFA) 108 (90 Base) MCG/ACT inhaler INHALE 2 PUFFS INTO THE LUNGS EVERY 6 HOURS AS NEEDED FOR WHEEZING ORSHORTNESS OF BREATH 08/25/21  Yes Sowles, Krichna, MD  BREZTRI  AEROSPHERE 160-9-4.8 MCG/ACT AERO inhaler Inhale 2 puffs into the lungs 2 (two) times daily. 07/20/23 07/19/24 Yes [provider]  clonazePAM  (KLONOPIN ) 1 MG tablet Take 0.5-1 mg by mouth 2 (two) times daily as needed  for anxiety.   Yes [provider]  Fluticasone-Umeclidin-Vilant (TRELEGY ELLIPTA ) 100-62.5-25 MCG/ACT AEPB Inhale 1 puff into the lungs daily. 12/14/22  Yes Sowles, Krichna, MD  ipratropium-albuterol  (DUONEB) 0.5-2.5 (3) MG/3ML SOLN Inhale 3 mLs into the lungs 3 (three) times daily. 06/26/23 06/20/24 Yes [provider]  Methylcobalamin (B-12) 1000 MCG TBDP PLACE 1 TABLET UNDER THE TONGUE DAILY 08/15/22  Yes Sowles, Krichna, MD  nicotine  (NICODERM CQ  - DOSED IN  MG/24 HOURS) 21 mg/24hr patch Place 1 patch (21 mg total) onto the skin daily. 10/06/23  Yes Wouk, Devaughn Sayres, MD  roflumilast (DALIRESP) 500 MCG TABS tablet Take 500 mcg by mouth daily.   Yes [provider]  tamsulosin  (FLOMAX ) 0.4 MG CAPS capsule Take 2 capsules (0.8 mg total) by mouth daily. 10/05/23  Yes Wouk, Devaughn Sayres, MD  torsemide (DEMADEX) 5 MG tablet Take 5 mg by mouth daily as needed. 10/24/23 10/23/24 Yes [provider]  aspirin  EC 81 MG tablet Take 1 tablet (81 mg total) by mouth daily. Swallow whole. Patient not taking: Reported on 10/01/2023 08/25/21   Sowles, Krichna, MD  buPROPion  (WELLBUTRIN  XL) 150 MG 24 hr tablet Take 150 mg by mouth daily. Patient not taking: Reported on 03/07/2024 05/16/23   [provider]  rosuvastatin  (CRESTOR ) 10 MG tablet Take 1 tablet (10 mg total) by mouth daily. Patient not taking: Reported on 10/01/2023 12/14/22   Sowles, Krichna, MD  traZODone (DESYREL) 100 MG tablet Take 100 mg by mouth at bedtime. Patient not taking: Reported on 03/07/2024 07/13/23   [provider]     Critical care provider statement:   Total critical care time: 33 minutes   Performed by: Parris MD   Critical care time was exclusive of separately billable procedures and treating other patients.   Critical care was necessary to treat or prevent imminent or life-threatening deterioration.   Critical care was time spent personally by me on the following activities:  development of treatment plan with patient and/or surrogate as well as nursing, discussions with consultants, evaluation of patient's response to treatment, examination of patient, obtaining history from patient or surrogate, ordering and performing treatments and interventions, ordering and review of laboratory studies, ordering and review of radiographic studies, pulse oximetry and re-evaluation of patient's condition.    Vicci Reder, M.D.  Pulmonary & Critical Care Medicine

## 2024-03-08 NOTE — Progress Notes (Signed)
 PHARMACY CONSULT NOTE - ELECTROLYTES  Pharmacy Consult for Electrolyte Monitoring and Replacement   Recent Labs: Height: 5' 10 (177.8 cm) Weight: 85.3 kg (188 lb 0.8 oz) IBW/kg (Calculated) : 73 Estimated Creatinine Clearance: 86.9 mL/min (by C-G formula based on SCr of 0.84 mg/dL). Potassium (mmol/L)  Date Value  03/08/2024 5.3 (H)   Magnesium  (mg/dL)  Date Value  87/94/7974 1.9   Calcium  (mg/dL)  Date Value  87/94/7974 8.7 (L)   Albumin (g/dL)  Date Value  87/94/7974 3.4 (L)  04/27/2020 4.1   Phosphorus (mg/dL)  Date Value  87/94/7974 3.1   Sodium (mmol/L)  Date Value  03/08/2024 134 (L)  04/27/2020 138   Assessment  Stephen [name] is a 68 y.o. male presenting with acute respiratory failure with hypoxia and hypercarbia. PMH significant for advanced COPD, anxiety, CAD. Pharmacy has been consulted to monitor and replace electrolytes.  Diet: NPO MIVF: None Pertinent medications: None  Goal of Therapy: Electrolytes WNL  Plan:  K 5.3, defer management to PCCM team Follow-up electrolytes with AM labs tomorrow  Thank you for allowing pharmacy to be a part of this patient's care.  Marolyn KATHEE Mare 03/08/2024 8:13 AM

## 2024-03-09 DIAGNOSIS — Z720 Tobacco use: Secondary | ICD-10-CM

## 2024-03-09 DIAGNOSIS — J9602 Acute respiratory failure with hypercapnia: Secondary | ICD-10-CM

## 2024-03-09 DIAGNOSIS — J9601 Acute respiratory failure with hypoxia: Principal | ICD-10-CM

## 2024-03-09 LAB — CBC
HCT: 45.6 % (ref 39.0–52.0)
Hemoglobin: 15.1 g/dL (ref 13.0–17.0)
MCH: 32.3 pg (ref 26.0–34.0)
MCHC: 33.1 g/dL (ref 30.0–36.0)
MCV: 97.6 fL (ref 80.0–100.0)
Platelets: 240 K/uL (ref 150–400)
RBC: 4.67 MIL/uL (ref 4.22–5.81)
RDW: 13.3 % (ref 11.5–15.5)
WBC: 9.5 K/uL (ref 4.0–10.5)
nRBC: 0 % (ref 0.0–0.2)

## 2024-03-09 LAB — MAGNESIUM: Magnesium: 2.1 mg/dL (ref 1.7–2.4)

## 2024-03-09 LAB — RENAL FUNCTION PANEL
Albumin: 3.7 g/dL (ref 3.5–5.0)
Anion gap: 8 (ref 5–15)
BUN: 22 mg/dL (ref 8–23)
CO2: 34 mmol/L — ABNORMAL HIGH (ref 22–32)
Calcium: 9.1 mg/dL (ref 8.9–10.3)
Chloride: 99 mmol/L (ref 98–111)
Creatinine, Ser: 0.73 mg/dL (ref 0.61–1.24)
GFR, Estimated: >60 mL/min (ref >60)
Glucose, Bld: 79 mg/dL (ref 70–99)
Phosphorus: 2.6 mg/dL (ref 2.5–4.6)
Potassium: 4.2 mmol/L (ref 3.5–5.1)
Sodium: 141 mmol/L (ref 135–145)

## 2024-03-09 LAB — BLOOD GAS, VENOUS
Acid-Base Excess: 11.2 mmol/L — ABNORMAL HIGH (ref 0.0–2.0)
Bicarbonate: 37 mmol/L — ABNORMAL HIGH (ref 20.0–28.0)
O2 Saturation: 95.1 %
Patient temperature: 37
pCO2, Ven: 52 mmHg (ref 44–60)
pH, Ven: 7.46 — ABNORMAL HIGH (ref 7.25–7.43)
pO2, Ven: 66 mmHg — ABNORMAL HIGH (ref 32–45)

## 2024-03-09 LAB — GLUCOSE, CAPILLARY
Glucose-Capillary: 93 mg/dL (ref 70–99)
Glucose-Capillary: 88 mg/dL (ref 70–99)
Glucose-Capillary: 84 mg/dL (ref 70–99)
Glucose-Capillary: 75 mg/dL (ref 70–99)
Glucose-Capillary: 103 mg/dL — ABNORMAL HIGH (ref 70–99)
Glucose-Capillary: 99 mg/dL (ref 70–99)

## 2024-03-09 MED ORDER — IPRATROPIUM-ALBUTEROL 0.5-2.5 (3) MG/3ML IN SOLN
3.0000 mL | Freq: Four times a day (QID) | RESPIRATORY_TRACT | Status: DC
  Administered 2024-03-09 (×2): 3 mL via RESPIRATORY_TRACT
  Filled 2024-03-09 (×2): qty 3

## 2024-03-09 MED ORDER — ROFLUMILAST 500 MCG PO TABS
500.0000 ug | ORAL_TABLET | Freq: Every day | ORAL | Status: DC
  Administered 2024-03-09 – 2024-03-10 (×2): 500 ug via ORAL
  Filled 2024-03-09 (×2): qty 1

## 2024-03-09 MED ORDER — DIAZEPAM 5 MG/ML IJ SOLN
2.5000 mg | INTRAMUSCULAR | Status: DC | PRN
  Administered 2024-03-10: 2.5 mg via INTRAVENOUS
  Filled 2024-03-09: qty 2

## 2024-03-09 MED ORDER — IPRATROPIUM-ALBUTEROL 0.5-2.5 (3) MG/3ML IN SOLN: 3.0000 mL | RESPIRATORY_TRACT | Status: DC | PRN

## 2024-03-09 MED ORDER — IPRATROPIUM-ALBUTEROL 0.5-2.5 (3) MG/3ML IN SOLN
RESPIRATORY_TRACT | Status: AC
  Administered 2024-03-09: 3 mL via RESPIRATORY_TRACT
  Filled 2024-03-09: qty 3

## 2024-03-09 MED ORDER — AZITHROMYCIN 250 MG PO TABS
500.0000 mg | ORAL_TABLET | Freq: Once | ORAL | Status: AC
  Administered 2024-03-09: 500 mg via ORAL
  Filled 2024-03-09: qty 2

## 2024-03-09 MED ORDER — ENOXAPARIN SODIUM 40 MG/0.4ML IJ SOSY
40.0000 mg | PREFILLED_SYRINGE | INTRAMUSCULAR | Status: DC
  Administered 2024-03-10: 40 mg via SUBCUTANEOUS
  Filled 2024-03-09: qty 0.4

## 2024-03-09 MED ORDER — DIAZEPAM 5 MG/ML IJ SOLN
2.5000 mg | Freq: Four times a day (QID) | INTRAMUSCULAR | Status: DC | PRN
  Administered 2024-03-09: 2.5 mg via INTRAVENOUS
  Filled 2024-03-09: qty 2

## 2024-03-09 MED ORDER — PREDNISONE 20 MG PO TABS
40.0000 mg | ORAL_TABLET | Freq: Every day | ORAL | Status: DC
  Administered 2024-03-10: 40 mg via ORAL
  Filled 2024-03-09: qty 2

## 2024-03-09 MED ORDER — TAMSULOSIN HCL 0.4 MG PO CAPS
0.4000 mg | ORAL_CAPSULE | Freq: Two times a day (BID) | ORAL | Status: DC
  Administered 2024-03-09 – 2024-03-10 (×2): 0.4 mg via ORAL
  Filled 2024-03-09 (×2): qty 1

## 2024-03-09 MED ORDER — INSULIN ASPART 100 UNIT/ML IJ SOLN: 0.0000 [IU] | Freq: Every day | INTRAMUSCULAR | Status: DC

## 2024-03-09 MED ORDER — IPRATROPIUM-ALBUTEROL 0.5-2.5 (3) MG/3ML IN SOLN
3.0000 mL | Freq: Three times a day (TID) | RESPIRATORY_TRACT | Status: DC
  Administered 2024-03-10 (×2): 3 mL via RESPIRATORY_TRACT
  Filled 2024-03-09 (×2): qty 3

## 2024-03-09 MED ORDER — ALBUTEROL SULFATE (2.5 MG/3ML) 0.083% IN NEBU
2.5000 mg | INHALATION_SOLUTION | RESPIRATORY_TRACT | Status: DC | PRN
  Administered 2024-03-09: 2.5 mg via RESPIRATORY_TRACT
  Filled 2024-03-09: qty 3

## 2024-03-09 MED ORDER — TAMSULOSIN HCL 0.4 MG PO CAPS
0.8000 mg | ORAL_CAPSULE | Freq: Every day | ORAL | Status: DC
  Administered 2024-03-09: 0.8 mg via ORAL
  Filled 2024-03-09: qty 2

## 2024-03-09 MED ORDER — INSULIN ASPART 100 UNIT/ML IJ SOLN
0.0000 [IU] | Freq: Three times a day (TID) | INTRAMUSCULAR | Status: DC
  Administered 2024-03-10: 3 [IU] via SUBCUTANEOUS
  Filled 2024-03-09: qty 3

## 2024-03-09 NOTE — Progress Notes (Signed)
 NAME:  Stephen [name], MRN:  [id number], DOB:  February 22, 1956, LOS: 2 ADMISSION DATE:  03/07/2024, CONSULTATION DATE:  03/07/2024 REFERRING MD:  Dr. Nicholaus, CHIEF COMPLAINT:  Acute Respiratory Distress   Brief Pt Description / Synopsis:  68 y.o. male with PMHx significant for COPD admitted with Acute Hypoxic and Hypercapnic Respiratory Failure due to Acute COPD Exacerbation, failed trial of BiPAP requiring intubation and mechanical ventilation.   History of Present Illness:  Stephen [name] is a 68 year old male with past medical history significant for COPD who presented to [facility] ED on 12//25 due to acute respiratory distress.  Patient is currently intubated and sedated and no family currently available, therefore history is obtained from chart review.  It is reported that he had not been feeling well for the past few days.  Today EMS was dispatched due to shortness of breath, and upon their arrival he was noted to be hypoxic with O2 saturations in the 70s on room air along with poor air movement.  He was noted to be somnolent, but able to arouse.  He denied chest pain, cough, fever, chills.  On arrival to the ED he remained somnolent but interactive, along with tachypnea and shallow respirations at which he was placed on BiPAP.  Despite BiPAP therapy, ABG exhibited worsening respiratory acidosis ultimately requiring intubation.  Of note he required admission in July 2025 due to acute hypercapnic respiratory failure due to acute COPD exacerbation, which he failed BiPAP requiring intubation at that time.  CTA was negative for PE  03/09/24- patient is sitting up in bed in no apparent distress.  Patient had his home klonopin , dalirersp, Breztri  restarted and has his foley removed.  I have dcd his pulmicort  and dcd glycopyrrolate  since he is now getting in aerosolized form via [employer]. Plan is for dc home in am.   ED Course: Initial Vital Signs: Temperature 98.4 F orally, pulse 103, respiratory rate  44, blood pressure 155/94, SpO2 92% Significant Labs: Potassium 5.3, chloride 95, bicarb 35, glucose 129 VBG: pH 7.21/pCO2 105/pO2 51/bicarb 42 ~follow-up VBG on BiPAP: pH 7.16/pCO2 116/pO2 45/bicarb 41.3 Imaging Chest X-ray>>IMPRESSION: No acute cardiopulmonary disease. Medications Administered: duo-nebs, 125 mg Solumedrol   PCCM asked to admit for further workup and treatment.  Please see Significant Hospital Events section below for full detailed hospital course.  03/08/24- patient passed SBT today and has been liberated off MV.   Pertinent  Medical History   Past Medical History:  Diagnosis Date   Allergy    seasonal   Anxiety    CAD in native artery    COPD (chronic obstructive pulmonary disease) (HCC)    Thoracic aortic atherosclerosis     Micro Data:  12/4: COVID/flu/RSV PCR>> negative 12/4: RVP>> 12/4: Blood cultures x2>> 12/4: Tracheal aspirate>>  Antimicrobials:   Anti-infectives (From admission, onward)    Start     Dose/Rate Route Frequency Ordered Stop   03/07/24 1700  azithromycin  (ZITHROMAX ) 500 mg in sodium chloride  0.9 % 250 mL IVPB        500 mg 250 mL/hr over 60 Minutes Intravenous Every 24 hours 03/07/24 1637 03/10/24 1659       Significant Hospital Events: Including procedures, antibiotic start and stop dates in addition to other pertinent events   12/4: Presented to ED with Acute Respiratory Distress, placed on BiPAP.  Somnolent, with worsening respiratory acidosis despite BiPAP requiring intubation and mechanical ventilation.  PCCM asked to admit.   Interim History / Subjective:  As outlined above under  Significant Hospital Events section  Objective   Blood pressure 124/66, pulse 91, temperature (!) 97.5 F (36.4 C), resp. rate 16, height 5' 10 (1.778 m), weight 85.1 kg, SpO2 97%.        Intake/Output Summary (Last 24 hours) at 03/09/2024 0935 Last data filed at 03/09/2024 0606 Gross per 24 hour  Intake 346.17 ml  Output 3050 ml   Net -2703.83 ml   Filed Weights   03/07/24 1319 03/08/24 0500 03/09/24 0500  Weight: 90 kg 85.3 kg 85.1 kg    Examination: General: Critically on chronically ill-appearing male, laying in bed, intubated and sedated, no acute distress HENT: Atraumatic, normocephalic, neck supple, no JVD, orally intubated Lungs: Diminished breath sounds throughout, even, nonlabored, synchronous with the vent Cardiovascular: Tachycardic, regular rhythm, S1-S2, no murmurs, rubs, gallops Abdomen: Soft, nontender, nondistended, no guarding or rebound tenderness, bowel sounds positive x 4 Extremities: Normal bulk and tone, no deformities, no edema, warm and well-perfused Neuro: Sedated, currently does not withdraw from painful stimuli or follow commands, pupils PERRLA GU: Foley catheter in place draining yellow urine  Resolved Hospital Problem list     Assessment & Plan:    #Acute COPD Exacerbation -s/p extubation 03/08/24 patient is now more lucid - will repeat VBG today to confirm absence of hypercapnic failure -may be able to dc home tommorow  #Mild Hyperkalemia -Monitor I&O's / urinary output -Follow BMP -Ensure adequate renal perfusion -Avoid nephrotoxic agents as able -Replace electrolytes as indicated ~ Pharmacy following for assistance with electrolyte replacement  #Hyperglycemia -CBG's q4h; Target range of 140 to 180 -SSI -Follow ICU Hypo/Hyperglycemia protocol  #Acute Metabolic Encephalopathy due to CO2 Narcosis-RESOLVED     Best Practice (right click and Reselect all SmartList Selections daily)   Diet/type: NPO DVT prophylaxis: prophylactic heparin   GI prophylaxis: PPI Lines: N/A Foley:  Yes, and it is still needed Code Status:  DNR (see Dr. Braxton IPAL note) Last date of multidisciplinary goals of care discussion [N/A]   Labs   CBC: Recent Labs  Lab 03/07/24 1318 03/08/24 0321 03/09/24 0824  WBC 8.2 4.1 9.5  NEUTROABS 6.4  --   --   HGB 15.4 13.5 15.1   HCT 48.8 41.5 45.6  MCV 102.5* 97.6 97.6  PLT 242 197 240    Basic Metabolic Panel: Recent Labs  Lab 03/07/24 1318 03/07/24 2148 03/08/24 0321 03/09/24 0824  NA 135  --  134* 141  K 5.3* 5.1 5.3* 4.2  CL 95*  --  96* 99  CO2 35*  --  29 34*  GLUCOSE 129*  --  121* 79  BUN 13  --  21 22  CREATININE 0.64  --  0.84 0.73  CALCIUM  9.2  --  8.7* 9.1  MG  --   --  1.9 2.1  PHOS  --   --  3.1 2.6   GFR: Estimated Creatinine Clearance: 91.3 mL/min (by C-G formula based on SCr of 0.73 mg/dL). Recent Labs  Lab 03/07/24 1318 03/07/24 1759 03/07/24 2148 03/08/24 0321 03/09/24 0824  PROCALCITON  --  <0.10  --   --   --   WBC 8.2  --   --  4.1 9.5  LATICACIDVEN 0.7  --  1.4  --   --     Liver Function Tests: Recent Labs  Lab 03/07/24 1318 03/08/24 0321 03/09/24 0824  AST 12*  --   --   ALT 8  --   --   ALKPHOS 82  --   --  BILITOT 0.4  --   --   PROT 7.2  --   --   ALBUMIN 4.2 3.4* 3.7   No results for input(s): LIPASE, AMYLASE in the last 168 hours. No results for input(s): AMMONIA in the last 168 hours.  ABG    Component Value Date/Time   PHART 7.38 03/08/2024 0401   PCO2ART 56 (H) 03/08/2024 0401   PO2ART 64 (L) 03/08/2024 0401   HCO3 33.1 (H) 03/08/2024 0401   O2SAT 93.8 03/08/2024 0401     Coagulation Profile: No results for input(s): INR, PROTIME in the last 168 hours.  Cardiac Enzymes: No results for input(s): CKTOTAL, CKMB, CKMBINDEX, TROPONINI in the last 168 hours.  HbA1C: Hgb A1c MFr Bld  Date/Time Value Ref Range Status  03/08/2024 03:21 AM 5.8 (H) 4.8 - 5.6 % Final    Comment:    (NOTE)         Prediabetes: 5.7 - 6.4         Diabetes: >6.4         Glycemic control for adults with diabetes: <7.0   08/09/2022 04:01 PM 6.5 (H) <5.7 % of total Hgb Final    Comment:    For someone without known diabetes, a hemoglobin A1c value of 6.5% or greater indicates that they may have  diabetes and this should be confirmed with a  follow-up  test. . For someone with known diabetes, a value <7% indicates  that their diabetes is well controlled and a value  greater than or equal to 7% indicates suboptimal  control. A1c targets should be individualized based on  duration of diabetes, age, comorbid conditions, and  other considerations. . Currently, no consensus exists regarding use of hemoglobin A1c for diagnosis of diabetes for children. .     CBG: Recent Labs  Lab 03/08/24 1116 03/08/24 1614 03/09/24 0121 03/09/24 0421 03/09/24 0808  GLUCAP 109* 137* 93 88 84    Review of Systems:   Unable to assess due to intubation/sedation/critical illness   Past Medical History:  He,  has a past medical history of Allergy, Anxiety, CAD in native artery, COPD (chronic obstructive pulmonary disease) (HCC), and Thoracic aortic atherosclerosis.   Surgical History:  No past surgical history on file.   Social History:   reports that he has been smoking cigarettes. He has a 45 pack-year smoking history. He has never used smokeless tobacco. He reports that he does not drink alcohol and does not use drugs.   Family History:  His family history includes Alzheimer's disease in his brother and father; Hypertension in his mother; Hypothyroidism in his mother.   Allergies No Known Allergies   Home Medications  Prior to Admission medications   Medication Sig Start Date End Date Taking? Authorizing Provider  albuterol  (VENTOLIN  HFA) 108 (90 Base) MCG/ACT inhaler INHALE 2 PUFFS INTO THE LUNGS EVERY 6 HOURS AS NEEDED FOR WHEEZING ORSHORTNESS OF BREATH 08/25/21  Yes Sowles, Krichna, MD  BREZTRI  AEROSPHERE 160-9-4.8 MCG/ACT AERO inhaler Inhale 2 puffs into the lungs 2 (two) times daily. 07/20/23 07/19/24 Yes [provider]  clonazePAM  (KLONOPIN ) 1 MG tablet Take 0.5-1 mg by mouth 2 (two) times daily as needed for anxiety.   Yes [provider]  Fluticasone-Umeclidin-Vilant (TRELEGY ELLIPTA ) 100-62.5-25  MCG/ACT AEPB Inhale 1 puff into the lungs daily. 12/14/22  Yes Sowles, Krichna, MD  ipratropium-albuterol  (DUONEB) 0.5-2.5 (3) MG/3ML SOLN Inhale 3 mLs into the lungs 3 (three) times daily. 06/26/23 06/20/24 Yes [provider]  Methylcobalamin (  B-12) 1000 MCG TBDP PLACE 1 TABLET UNDER THE TONGUE DAILY 08/15/22  Yes Sowles, Krichna, MD  nicotine  (NICODERM CQ  - DOSED IN MG/24 HOURS) 21 mg/24hr patch Place 1 patch (21 mg total) onto the skin daily. 10/06/23  Yes Wouk, Devaughn Sayres, MD  roflumilast  (DALIRESP ) 500 MCG TABS tablet Take 500 mcg by mouth daily.   Yes [provider]  tamsulosin  (FLOMAX ) 0.4 MG CAPS capsule Take 2 capsules (0.8 mg total) by mouth daily. 10/05/23  Yes Wouk, Devaughn Sayres, MD  torsemide (DEMADEX) 5 MG tablet Take 5 mg by mouth daily as needed. 10/24/23 10/23/24 Yes [provider]  aspirin  EC 81 MG tablet Take 1 tablet (81 mg total) by mouth daily. Swallow whole. Patient not taking: Reported on 10/01/2023 08/25/21   Sowles, Krichna, MD  buPROPion  (WELLBUTRIN  XL) 150 MG 24 hr tablet Take 150 mg by mouth daily. Patient not taking: Reported on 03/07/2024 05/16/23   [provider]  rosuvastatin  (CRESTOR ) 10 MG tablet Take 1 tablet (10 mg total) by mouth daily. Patient not taking: Reported on 10/01/2023 12/14/22   Sowles, Krichna, MD  traZODone (DESYREL) 100 MG tablet Take 100 mg by mouth at bedtime. Patient not taking: Reported on 03/07/2024 07/13/23   [provider]    Halina Picking, M.D.  Pulmonary & Critical Care Medicine

## 2024-03-09 NOTE — Progress Notes (Addendum)
 PROGRESS NOTE    Stephen [name]  FMW:[id number] DOB: May 26, 1955 DOA: 03/07/2024 PCP: Cleotilde Oneil FALCON, MD  Outpatient Specialists: pulm    Brief Narrative:   Stephen [name] is a 68 year old male with past medical history significant for COPD who presented to [facility] ED on 12//25 due to acute respiratory distress.  Patient is currently intubated and sedated and no family currently available, therefore history is obtained from chart review.   It is reported that he had not been feeling well for the past few days.  Today EMS was dispatched due to shortness of breath, and upon their arrival he was noted to be hypoxic with O2 saturations in the 70s on room air along with poor air movement.  He was noted to be somnolent, but able to arouse.  He denied chest pain, cough, fever, chills.  On arrival to the ED he remained somnolent but interactive, along with tachypnea and shallow respirations at which he was placed on BiPAP.  Despite BiPAP therapy, ABG exhibited worsening respiratory acidosis ultimately requiring intubation.   Of note he required admission in July 2025 due to acute hypercapnic respiratory failure due to acute COPD exacerbation, which he failed BiPAP requiring intubation at that time.  CTA was negative for PE  12/4: Presented to ED with Acute Respiratory Distress, placed on BiPAP.  Somnolent, with worsening respiratory acidosis despite BiPAP requiring intubation and mechanical ventilation.  PCCM asked to admit.   Assessment & Plan:   Principal Problem:   Acute respiratory failure with hypoxia and hypercarbia (HCC) Active Problems:   Current every day smoker   COPD with acute exacerbation (HCC)   DM type 2 with diabetic mixed hyperlipidemia (HCC)  # COPD exacerbation Improving. Respiratory panels neg - continue azithromycin  - switch methylpred to prednisone  tomorrow - continue budesonide  and scheduled duonebs - albuterol  q2 prn - on breztri  at home - cont home daliresp  - f/u  tracheal aspirate culture - outpt pulm f/u (desires new pulmonologist)  # Acute hypoxic hypercarbic respiratory failure Intubated 12/4, extubated 12/5, now breathing comfortably but remains hypoxic (87) on room air - continue [place] o2, wean as able - may benefit from bipap outpt  # Tobacco abuse Brief intervention today 10 min  # T2DM Glucose appropriate - ssi  # BPH - home flomax   # GAD - home benzo  # Lines/tubes - d/c foley today   DVT prophylaxis: lovenox  Code Status: dnr/dni Family Communication: son at bedside 12/6  Level of care: Telemetry Status is: Inpatient Remains inpatient appropriate because: severity of illness    Consultants:  pccm  Procedures: intubation  Antimicrobials:  azithromycin     Subjective:  Reports breathing much better  Objective: Vitals:   03/09/24 0409 03/09/24 0423 03/09/24 0500 03/09/24 0804  BP:  119/64  124/66  Pulse:  75  91  Resp:    16  Temp:  97.8 F (36.6 C)  (!) 97.5 F (36.4 C)  TempSrc:  Oral    SpO2: 94% 95%  97%  Weight:   85.1 kg   Height:        Intake/Output Summary (Last 24 hours) at 03/09/2024 1103 Last data filed at 03/09/2024 0606 Gross per 24 hour  Intake 322.95 ml  Output 2700 ml  Net -2377.05 ml   Filed Weights   03/07/24 1319 03/08/24 0500 03/09/24 0500  Weight: 90 kg 85.3 kg 85.1 kg    Examination:  General exam: Appears calm and comfortable  Respiratory system: few faint scattered rhonchi otherwise clear  no wheeze Cardiovascular system: S1 & S2 heard, RRR. No JVD, murmurs, rubs, gallops or clicks. No pedal edema. Gastrointestinal system: Abdomen is nondistended, soft and nontender. No organomegaly or masses felt. Normal bowel sounds heard. Central nervous system: Alert and oriented. No focal neurological deficits. Extremities: Symmetric 5 x 5 power. Skin: No rashes, lesions or ulcers Psychiatry: Judgement and insight appear normal. Mood & affect appropriate.     Data Reviewed: I  have personally reviewed following labs and imaging studies  CBC: Recent Labs  Lab 03/07/24 1318 03/08/24 0321 03/09/24 0824  WBC 8.2 4.1 9.5  NEUTROABS 6.4  --   --   HGB 15.4 13.5 15.1  HCT 48.8 41.5 45.6  MCV 102.5* 97.6 97.6  PLT 242 197 240   Basic Metabolic Panel: Recent Labs  Lab 03/07/24 1318 03/07/24 2148 03/08/24 0321 03/09/24 0824  NA 135  --  134* 141  K 5.3* 5.1 5.3* 4.2  CL 95*  --  96* 99  CO2 35*  --  29 34*  GLUCOSE 129*  --  121* 79  BUN 13  --  21 22  CREATININE 0.64  --  0.84 0.73  CALCIUM  9.2  --  8.7* 9.1  MG  --   --  1.9 2.1  PHOS  --   --  3.1 2.6   GFR: Estimated Creatinine Clearance: 91.3 mL/min (by C-G formula based on SCr of 0.73 mg/dL). Liver Function Tests: Recent Labs  Lab 03/07/24 1318 03/08/24 0321 03/09/24 0824  AST 12*  --   --   ALT 8  --   --   ALKPHOS 82  --   --   BILITOT 0.4  --   --   PROT 7.2  --   --   ALBUMIN 4.2 3.4* 3.7   No results for input(s): LIPASE, AMYLASE in the last 168 hours. No results for input(s): AMMONIA in the last 168 hours. Coagulation Profile: No results for input(s): INR, PROTIME in the last 168 hours. Cardiac Enzymes: No results for input(s): CKTOTAL, CKMB, CKMBINDEX, TROPONINI in the last 168 hours. BNP (last 3 results) No results for input(s): PROBNP in the last 8760 hours. HbA1C: Recent Labs    03/08/24 0321  HGBA1C 5.8*   CBG: Recent Labs  Lab 03/08/24 1116 03/08/24 1614 03/09/24 0121 03/09/24 0421 03/09/24 0808  GLUCAP 109* 137* 93 88 84   Lipid Profile: Recent Labs    03/08/24 0321  TRIG 55   Thyroid  Function Tests: No results for input(s): TSH, T4TOTAL, FREET4, T3FREE, THYROIDAB in the last 72 hours. Anemia Panel: No results for input(s): VITAMINB12, FOLATE, FERRITIN, TIBC, IRON, RETICCTPCT in the last 72 hours. Urine analysis:    Component Value Date/Time   COLORURINE AMBER (A) 03/07/2024 1815   APPEARANCEUR CLOUDY  (A) 03/07/2024 1815   LABSPEC 1.026 03/07/2024 1815   PHURINE 5.0 03/07/2024 1815   GLUCOSEU NEGATIVE 03/07/2024 1815   HGBUR NEGATIVE 03/07/2024 1815   BILIRUBINUR NEGATIVE 03/07/2024 1815   KETONESUR NEGATIVE 03/07/2024 1815   PROTEINUR 100 (A) 03/07/2024 1815   NITRITE NEGATIVE 03/07/2024 1815   LEUKOCYTESUR NEGATIVE 03/07/2024 1815   Sepsis Labs: @LABRCNTIP (procalcitonin:4,lacticidven:4)  ) Recent Results (from the past 240 hours)  Blood Culture (routine x 2)     Status: None (Preliminary result)   Collection Time: 03/07/24  1:40 PM   Specimen: BLOOD  Result Value Ref Range Status   Specimen Description BLOOD RIGHT ANTECUBITAL  Final   Special Requests   Final  BOTTLES DRAWN AEROBIC AND ANAEROBIC Blood Culture results may not be optimal due to an inadequate volume of blood received in culture bottles   Culture   Final    NO GROWTH 2 DAYS Performed at [facility], [address] [place], [place] [address]    Report Status PENDING  Incomplete  Blood Culture (routine x 2)     Status: None (Preliminary result)   Collection Time: 03/07/24  1:40 PM   Specimen: BLOOD  Result Value Ref Range Status   Specimen Description BLOOD BLOOD LEFT FOREARM  Final   Special Requests   Final    BOTTLES DRAWN AEROBIC AND ANAEROBIC Blood Culture results may not be optimal due to an inadequate volume of blood received in culture bottles   Culture   Final    NO GROWTH 2 DAYS Performed at [facility], [address], [place], [place] [address]    Report Status PENDING  Incomplete  Resp panel by RT-PCR (RSV, Flu A&B, Covid) Anterior Nasal Swab     Status: None   Collection Time: 03/07/24  3:09 PM   Specimen: Anterior Nasal Swab  Result Value Ref Range Status   SARS Coronavirus 2 by RT PCR NEGATIVE NEGATIVE Final    Comment: (NOTE) SARS-CoV-2 target nucleic acids are NOT DETECTED.  The SARS-CoV-2 RNA is generally detectable in upper respiratory specimens during the  acute phase of infection. The lowest concentration of SARS-CoV-2 viral copies this assay can detect is 138 copies/mL. A negative result does not preclude SARS-Cov-2 infection and should not be used as the sole basis for treatment or other patient management decisions. A negative result may occur with  improper specimen collection/handling, submission of specimen other than nasopharyngeal swab, presence of viral mutation(s) within the areas targeted by this assay, and inadequate number of viral copies(<138 copies/mL). A negative result must be combined with clinical observations, patient history, and epidemiological information. The expected result is Negative.  Fact Sheet for Patients:  bloggercourse.com  Fact Sheet for Healthcare Providers:  seriousbroker.it  This test is no t yet approved or cleared by the [place]  FDA and  has been authorized for detection and/or diagnosis of SARS-CoV-2 by FDA under an Emergency Use Authorization (EUA). This EUA will remain  in effect (meaning this test can be used) for the duration of the COVID-19 declaration under Section 564(b)(1) of the Act, 21 U.S.C.section 360bbb-3(b)(1), unless the authorization is terminated  or revoked sooner.       Influenza A by PCR NEGATIVE NEGATIVE Final   Influenza B by PCR NEGATIVE NEGATIVE Final    Comment: (NOTE) The Xpert Xpress SARS-CoV-2/FLU/RSV plus assay is intended as an aid in the diagnosis of influenza from Nasopharyngeal swab specimens and should not be used as a sole basis for treatment. Nasal washings and aspirates are unacceptable for Xpert Xpress SARS-CoV-2/FLU/RSV testing.  Fact Sheet for Patients: bloggercourse.com  Fact Sheet for Healthcare Providers: seriousbroker.it  This test is not yet approved or cleared by the [place]  FDA and has been authorized for detection and/or  diagnosis of SARS-CoV-2 by FDA under an Emergency Use Authorization (EUA). This EUA will remain in effect (meaning this test can be used) for the duration of the COVID-19 declaration under Section 564(b)(1) of the Act, 21 U.S.C. section 360bbb-3(b)(1), unless the authorization is terminated or revoked.     Resp Syncytial Virus by PCR NEGATIVE NEGATIVE Final    Comment: (NOTE) Fact Sheet for Patients: bloggercourse.com  Fact Sheet for Healthcare  Providers: seriousbroker.it  This test is not yet approved or cleared by the [place]  FDA and has been authorized for detection and/or diagnosis of SARS-CoV-2 by FDA under an Emergency Use Authorization (EUA). This EUA will remain in effect (meaning this test can be used) for the duration of the COVID-19 declaration under Section 564(b)(1) of the Act, 21 U.S.C. section 360bbb-3(b)(1), unless the authorization is terminated or revoked.  Performed at [facility], [address] [place], [place] [address]   MRSA Next Gen by PCR, Nasal     Status: None   Collection Time: 03/07/24  4:38 PM   Specimen: Nasal Mucosa; Nasal Swab  Result Value Ref Range Status   MRSA by PCR Next Gen NOT DETECTED NOT DETECTED Final    Comment: (NOTE) The GeneXpert MRSA Assay (FDA approved for NASAL specimens only), is one component of a comprehensive MRSA colonization surveillance program. It is not intended to diagnose MRSA infection nor to guide or monitor treatment for MRSA infections. Test performance is not FDA approved in patients less than 35 years old. Performed at [facility], [address] [place], [place] [address]   Culture, Respiratory w Gram Stain     Status: None (Preliminary result)   Collection Time: 03/07/24  4:55 PM   Specimen: Tracheal Aspirate; Respiratory  Result Value Ref Range Status   Specimen Description   Final    TRACHEAL ASPIRATE Performed at  [facility] [address] [place], [place] [address]    Special Requests   Final    NONE Performed at [facility], [address] [place], [place] [address]    Gram Stain   Final    RARE WBC PRESENT,BOTH PMN AND MONONUCLEAR NO ORGANISMS SEEN    Culture   Final    NO GROWTH < 24 HOURS Performed at [facility], [address], [place], [place] [address]    Report Status PENDING  Incomplete  Respiratory (~20 pathogens) panel by PCR     Status: None   Collection Time: 03/07/24  6:15 PM  Result Value Ref Range Status   Adenovirus NOT DETECTED NOT DETECTED Final   Coronavirus 229E NOT DETECTED NOT DETECTED Final    Comment: (NOTE) The Coronavirus on the Respiratory Panel, DOES NOT test for the novel  Coronavirus (2019 nCoV)    Coronavirus HKU1 NOT DETECTED NOT DETECTED Final   Coronavirus NL63 NOT DETECTED NOT DETECTED Final   Coronavirus OC43 NOT DETECTED NOT DETECTED Final   Metapneumovirus NOT DETECTED NOT DETECTED Final   Rhinovirus / Enterovirus NOT DETECTED NOT DETECTED Final   Influenza A NOT DETECTED NOT DETECTED Final   Influenza B NOT DETECTED NOT DETECTED Final   Parainfluenza Virus 1 NOT DETECTED NOT DETECTED Final   Parainfluenza Virus 2 NOT DETECTED NOT DETECTED Final   Parainfluenza Virus 3 NOT DETECTED NOT DETECTED Final   Parainfluenza Virus 4 NOT DETECTED NOT DETECTED Final   Respiratory Syncytial Virus NOT DETECTED NOT DETECTED Final   Bordetella pertussis NOT DETECTED NOT DETECTED Final   Bordetella Parapertussis NOT DETECTED NOT DETECTED Final   Chlamydophila pneumoniae NOT DETECTED NOT DETECTED Final   Mycoplasma pneumoniae NOT DETECTED NOT DETECTED Final    Comment: Performed at [facility], [address] [place], [place] [address]         Radiology Studies: DG Chest Port 1 View Result Date: 03/08/2024 EXAM: 1 VIEW(S) XRAY OF THE CHEST 03/08/2024 04:20:00 AM COMPARISON: 03/07/2024 CLINICAL  HISTORY: 68 year old  male. Acute respiratory failure with hypoxia and hypercarbia. FINDINGS: LINES, TUBES AND DEVICES: Endotracheal tube in place with tip 5.3 cm above the carina. Enteric tube in place with tip and side port overlying the expected region of the gastric lumen. Cardiac paddles noted. LUNGS AND PLEURA: Hyperinflation. Large lung volumes, with centrilobular and paraseptal emphysema confirmed on chest CT in June this year. No pleural effusion. No pneumothorax. HEART AND MEDIASTINUM: Atherosclerotic plaque noted. No acute abnormality of the cardiac and mediastinal silhouettes. BONES AND SOFT TISSUES: No acute osseous abnormality. IMPRESSION: 1. Satisfactory endotracheal and enteric tubes. 2. Emphysema, No acute cardiopulmonary abnormality. Electronically signed by: Helayne Hurst MD 03/08/2024 04:36 AM EST RP Workstation: [id number]   DG Abd 1 View Result Date: 03/07/2024 CLINICAL DATA:  Orogastric tube placement. EXAM: ABDOMEN - 1 VIEW COMPARISON:  Earlier today FINDINGS: Enteric tube has been advanced, the tip and side port are below the diaphragm in the stomach. Nonobstructive upper abdominal bowel gas pattern. IMPRESSION: Enteric tube has been advanced, the tip and side port are below the diaphragm in the stomach. Electronically Signed   By: Andrea Gasman M.D.   On: 03/07/2024 21:36   DG Abdomen 1 View Result Date: 03/07/2024 CLINICAL DATA:  Post orogastric tube EXAM: ABDOMEN - 1 VIEW COMPARISON:  Abdominal x-ray 03/07/2024 FINDINGS: Orogastric tube tip is in the proximal body of the stomach. Sidehole is at the level of the gastroesophageal junction. IMPRESSION: Orogastric tube tip is in the proximal body of the stomach. Sidehole is at the level of the gastroesophageal junction. Recommend advancing tube 6 cm. Electronically Signed   By: Greig Pique M.D.   On: 03/07/2024 17:37   DG Abdomen 1 View Result Date: 03/07/2024 EXAM: 1 VIEW XRAY OF THE ABDOMEN 03/07/2024 04:51:00 PM COMPARISON: Chest radiograph of  earlier in the day. CLINICAL HISTORY: post inbutation FINDINGS: LINES, TUBES AND DEVICES: Multiple wires in leads project over the lower chest and upper abdomen. Nasogastric tube terminates over the distal esophagus. BOWEL: Gas-filled stomach. No gross free intraperitoneal air. IMPRESSION: 1. Nasogastric tube tip overlies the distal esophagus and should be advanced for appropriate positioning. Electronically signed by: Rockey Kilts MD 03/07/2024 05:27 PM EST RP Workstation: [id number]   DG Chest Portable 1 View Result Date: 03/07/2024 EXAM: 1 VIEW(S) XRAY OF THE CHEST 03/07/2024 04:51:00 PM COMPARISON: Earlier today at 1:30 pm CLINICAL HISTORY: post intubation and OG placement FINDINGS: 4:42 pm portable AP radiographs. LINES, TUBES AND DEVICES: Endotracheal tube terminates 5.2 cm above carina. Nasogastric tube is difficult to follow distally secondary to multiple overlying wires and leads as well as an external pacer / defibrillator. Favored to terminate over the distal esophagus. External pacer / defibrillator. LUNGS AND PLEURA: Interstitial coarsening, likely related to the clinical history of emphysema. The left costophrenic angle is excluded. No large pleural effusion. No pneumothorax. HEART AND MEDIASTINUM: No acute abnormality of the cardiac and mediastinal silhouettes. BONES AND SOFT TISSUES: No acute osseous abnormality. IMPRESSION: 1. Appropriate position of endotracheal tube. 2. Nasogastric tube difficult to follow distally, favored to terminate over the distal esophagus. 3. No superimposed acute process. Electronically signed by: Rockey Kilts MD 03/07/2024 05:25 PM EST RP Workstation: [id number]   DG Chest Port 1 View Result Date: 03/07/2024 CLINICAL DATA:  Possible sepsis. EXAM: PORTABLE CHEST 1 VIEW COMPARISON:  10/01/2023 FINDINGS: Lungs are adequately inflated as patient is slightly rotated to the right. No lobar consolidation or effusion. No pneumothorax. Cardiomediastinal silhouette and  remainder of the exam  is unchanged. IMPRESSION: No acute cardiopulmonary disease. Electronically Signed   By: Toribio Agreste M.D.   On: 03/07/2024 14:16        Scheduled Meds:  budesonide  (PULMICORT ) nebulizer solution  0.5 mg Nebulization BID   Chlorhexidine  Gluconate Cloth  6 each Topical Daily   clonazePAM   1 mg Oral QHS   glycopyrrolate   1 mg Oral TID   heparin   5,000 Units Subcutaneous Q8H   insulin  aspart  0-15 Units Subcutaneous Q4H   ipratropium-albuterol   3 mL Nebulization TID   methylPREDNISolone  (SOLU-MEDROL ) injection  40 mg Intravenous Daily   roflumilast   500 mcg Oral Daily   tamsulosin   0.8 mg Oral Daily   Continuous Infusions:  azithromycin  Stopped (03/08/24 1743)     LOS: 2 days     Devaughn KATHEE Ban, MD [facility]   If 7PM-7AM, please contact night-coverage [email or web address] Password [email or web address] 03/09/2024, 11:03 AM

## 2024-03-09 NOTE — Plan of Care (Signed)
  Problem: Education: Goal: Knowledge of General Education information will improve Description: Including pain rating scale, medication(s)/side effects and non-pharmacologic comfort measures Outcome: Progressing   Problem: Clinical Measurements: Goal: Ability to maintain clinical measurements within normal limits will improve Outcome: Progressing Goal: Will remain free from infection Outcome: Progressing Goal: Diagnostic test results will improve Outcome: Progressing Goal: Respiratory complications will improve Outcome: Progressing Goal: Cardiovascular complication will be avoided Outcome: Progressing   Problem: Activity: Goal: Risk for activity intolerance will decrease Outcome: Progressing   Problem: Nutrition: Goal: Adequate nutrition will be maintained Outcome: Progressing   Problem: Safety: Goal: Ability to remain free from injury will improve Outcome: Progressing   Problem: Skin Integrity: Goal: Risk for impaired skin integrity will decrease Outcome: Progressing   

## 2024-03-10 LAB — GLUCOSE, CAPILLARY
Glucose-Capillary: 95 mg/dL (ref 70–99)
Glucose-Capillary: 186 mg/dL — ABNORMAL HIGH (ref 70–99)

## 2024-03-10 MED ORDER — ALBUTEROL SULFATE HFA 108 (90 BASE) MCG/ACT IN AERS: INHALATION_SPRAY | RESPIRATORY_TRACT | 1 refills | Status: AC

## 2024-03-10 MED ORDER — PREDNISONE 10 MG PO TABS: ORAL_TABLET | ORAL | 0 refills | Status: DC

## 2024-03-10 NOTE — Progress Notes (Signed)
SATURATION QUALIFICATIONS: (This note is used to comply with regulatory documentation for home oxygen) ° °Patient Saturations on Room Air at Rest = 88% ° °Patient Saturations on Room Air while Ambulating = 86% ° °Patient Saturations on 2 Liters of oxygen while Ambulating = 91% ° °Please briefly explain why patient needs home oxygen: °

## 2024-03-10 NOTE — Discharge Summary (Signed)
 Stephen [name] FMW:[id number] DOB: Jan 12, 1956 DOA: 03/07/2024  PCP: Cleotilde Oneil FALCON, MD  Admit date: 03/07/2024 Discharge date: 03/10/2024  Time spent: 35 minutes  Recommendations for Outpatient Follow-up:  Pulmonology and pcp f/u     Discharge Diagnoses:  Principal Problem:   Acute respiratory failure with hypoxia and hypercarbia (HCC) Active Problems:   Current every day smoker   COPD with acute exacerbation (HCC)   DM type 2 with diabetic mixed hyperlipidemia (HCC)   Discharge Condition: improved  Diet recommendation: heart healthy  Filed Weights   03/07/24 1319 03/08/24 0500 03/09/24 0500  Weight: 90 kg 85.3 kg 85.1 kg    History of present illness:   Stephen [name] is a 68 year old male with past medical history significant for COPD who presented to [facility] ED on 12//25 due to acute respiratory distress.  Patient is currently intubated and sedated and no family currently available, therefore history is obtained from chart review.   It is reported that he had not been feeling well for the past few days.  Today EMS was dispatched due to shortness of breath, and upon their arrival he was noted to be hypoxic with O2 saturations in the 70s on room air along with poor air movement.  He was noted to be somnolent, but able to arouse.  He denied chest pain, cough, fever, chills.  On arrival to the ED he remained somnolent but interactive, along with tachypnea and shallow respirations at which he was placed on BiPAP.  Despite BiPAP therapy, ABG exhibited worsening respiratory acidosis ultimately requiring intubation.  Hospital Course:   # COPD exacerbation Improving. Respiratory panels neg - home inhalers - steroid taper - pulm has ordered home bipap, considering change in biologics   # Acute hypoxic hypercarbic respiratory failure Intubated 12/4, extubated 12/5, now breathing comfortably but remains hypoxic on room air and with ambulation - home o2 ordered   # Tobacco abuse Brief  intervention     # T2DM Glucose appropriate   # BPH - home flomax    # GAD - home benzo  Procedures: intubation   Consultations: pccm  Discharge Exam: Vitals:   03/09/24 2051 03/10/24 0903  BP: 117/69 118/64  Pulse: 86 97  Resp:  18  Temp:  97.9 F (36.6 C)  SpO2: 96% 95%    General: NAD Cardiovascular: RRR Respiratory: few scattered rhonchi  Discharge Instructions   Discharge Instructions     Diet - low sodium heart healthy   Complete by: As directed    Increase activity slowly   Complete by: As directed       Allergies as of 03/10/2024   No Known Allergies      Medication List     STOP taking these medications    Trelegy Ellipta  100-62.5-25 MCG/ACT Aepb Generic drug: Fluticasone-Umeclidin-Vilant       TAKE these medications    albuterol  108 (90 Base) MCG/ACT inhaler Commonly known as: Ventolin  HFA INHALE 2 PUFFS INTO THE LUNGS EVERY 6 HOURS AS NEEDED FOR WHEEZING ORSHORTNESS OF BREATH   aspirin  EC 81 MG tablet Take 1 tablet (81 mg total) by mouth daily. Swallow whole.   B-12 1000 MCG Tbdp PLACE 1 TABLET UNDER THE TONGUE DAILY   Breztri  Aerosphere 160-9-4.8 MCG/ACT Aero inhaler Generic drug: budesonide -glycopyrrolate -formoterol  Inhale 2 puffs into the lungs 2 (two) times daily.   buPROPion  150 MG 24 hr tablet Commonly known as: WELLBUTRIN  XL Take 150 mg by mouth daily.   clonazePAM  1 MG tablet Commonly known as: KLONOPIN   Take 0.5-1 mg by mouth 2 (two) times daily as needed for anxiety.   ipratropium-albuterol  0.5-2.5 (3) MG/3ML Soln Commonly known as: DUONEB Inhale 3 mLs into the lungs 3 (three) times daily.   nicotine  21 mg/24hr patch Commonly known as: NICODERM CQ  - dosed in mg/24 hours Place 1 patch (21 mg total) onto the skin daily.   predniSONE  10 MG tablet Commonly known as: DELTASONE  4 tabs daily for 3 days then 3 tabs daily for 3 days then 2 tabs daily for 3 days then 1 tab daily for 3 days   roflumilast  500 MCG  Tabs tablet Commonly known as: DALIRESP  Take 500 mcg by mouth daily.   rosuvastatin  10 MG tablet Commonly known as: Crestor  Take 1 tablet (10 mg total) by mouth daily.   tamsulosin  0.4 MG Caps capsule Commonly known as: FLOMAX  Take 2 capsules (0.8 mg total) by mouth daily.   torsemide 5 MG tablet Commonly known as: DEMADEX Take 5 mg by mouth daily as needed.   traZODone 100 MG tablet Commonly known as: DESYREL Take 100 mg by mouth at bedtime.               Durable Medical Equipment  (From admission, onward)           Start     Ordered   03/10/24 1205  For home use only DME oxygen  Once       Question Answer Comment  Length of Need Lifetime   Mode or (Route) Nasal cannula   Liters per Minute 2   Frequency Continuous (stationary and portable oxygen unit needed)   Oxygen delivery system: Gas      03/10/24 1205   03/10/24 1120  For home use only DME Bipap  Once       Question Answer Comment  Length of Need Lifetime   Inspiratory pressure 15   Expiratory pressure 5      03/10/24 1119           No Known Allergies  Follow-up Information     Parris Manna, MD Follow up.   Specialty: Pulmonary Disease Contact information: [address] [place] [address] [phone]         Cleotilde Oneil FALCON, MD Follow up.   Specialty: Internal Medicine Contact information: [phone] [facility] [address] [facility] [place] [address] [phone]                  The results of significant diagnostics from this hospitalization (including imaging, microbiology, ancillary and laboratory) are listed below for reference.    Significant Diagnostic Studies: DG Chest Port 1 View Result Date: 03/08/2024 EXAM: 1 VIEW(S) XRAY OF THE CHEST 03/08/2024 04:20:00 AM COMPARISON: 03/07/2024 CLINICAL HISTORY: 68 year old male. Acute respiratory failure with hypoxia and hypercarbia. FINDINGS: LINES, TUBES AND DEVICES: Endotracheal tube in  place with tip 5.3 cm above the carina. Enteric tube in place with tip and side port overlying the expected region of the gastric lumen. Cardiac paddles noted. LUNGS AND PLEURA: Hyperinflation. Large lung volumes, with centrilobular and paraseptal emphysema confirmed on chest CT in June this year. No pleural effusion. No pneumothorax. HEART AND MEDIASTINUM: Atherosclerotic plaque noted. No acute abnormality of the cardiac and mediastinal silhouettes. BONES AND SOFT TISSUES: No acute osseous abnormality. IMPRESSION: 1. Satisfactory endotracheal and enteric tubes. 2. Emphysema, No acute cardiopulmonary abnormality. Electronically signed by: Helayne Hurst MD 03/08/2024 04:36 AM EST RP Workstation: [id number]   DG Abd 1 View Result Date:  03/07/2024 CLINICAL DATA:  Orogastric tube placement. EXAM: ABDOMEN - 1 VIEW COMPARISON:  Earlier today FINDINGS: Enteric tube has been advanced, the tip and side port are below the diaphragm in the stomach. Nonobstructive upper abdominal bowel gas pattern. IMPRESSION: Enteric tube has been advanced, the tip and side port are below the diaphragm in the stomach. Electronically Signed   By: Andrea Gasman M.D.   On: 03/07/2024 21:36   DG Abdomen 1 View Result Date: 03/07/2024 CLINICAL DATA:  Post orogastric tube EXAM: ABDOMEN - 1 VIEW COMPARISON:  Abdominal x-ray 03/07/2024 FINDINGS: Orogastric tube tip is in the proximal body of the stomach. Sidehole is at the level of the gastroesophageal junction. IMPRESSION: Orogastric tube tip is in the proximal body of the stomach. Sidehole is at the level of the gastroesophageal junction. Recommend advancing tube 6 cm. Electronically Signed   By: Greig Pique M.D.   On: 03/07/2024 17:37   DG Abdomen 1 View Result Date: 03/07/2024 EXAM: 1 VIEW XRAY OF THE ABDOMEN 03/07/2024 04:51:00 PM COMPARISON: Chest radiograph of earlier in the day. CLINICAL HISTORY: post inbutation FINDINGS: LINES, TUBES AND DEVICES: Multiple wires in leads project  over the lower chest and upper abdomen. Nasogastric tube terminates over the distal esophagus. BOWEL: Gas-filled stomach. No gross free intraperitoneal air. IMPRESSION: 1. Nasogastric tube tip overlies the distal esophagus and should be advanced for appropriate positioning. Electronically signed by: Rockey Kilts MD 03/07/2024 05:27 PM EST RP Workstation: [id number]   DG Chest Portable 1 View Result Date: 03/07/2024 EXAM: 1 VIEW(S) XRAY OF THE CHEST 03/07/2024 04:51:00 PM COMPARISON: Earlier today at 1:30 pm CLINICAL HISTORY: post intubation and OG placement FINDINGS: 4:42 pm portable AP radiographs. LINES, TUBES AND DEVICES: Endotracheal tube terminates 5.2 cm above carina. Nasogastric tube is difficult to follow distally secondary to multiple overlying wires and leads as well as an external pacer / defibrillator. Favored to terminate over the distal esophagus. External pacer / defibrillator. LUNGS AND PLEURA: Interstitial coarsening, likely related to the clinical history of emphysema. The left costophrenic angle is excluded. No large pleural effusion. No pneumothorax. HEART AND MEDIASTINUM: No acute abnormality of the cardiac and mediastinal silhouettes. BONES AND SOFT TISSUES: No acute osseous abnormality. IMPRESSION: 1. Appropriate position of endotracheal tube. 2. Nasogastric tube difficult to follow distally, favored to terminate over the distal esophagus. 3. No superimposed acute process. Electronically signed by: Rockey Kilts MD 03/07/2024 05:25 PM EST RP Workstation: [id number]   DG Chest Port 1 View Result Date: 03/07/2024 CLINICAL DATA:  Possible sepsis. EXAM: PORTABLE CHEST 1 VIEW COMPARISON:  10/01/2023 FINDINGS: Lungs are adequately inflated as patient is slightly rotated to the right. No lobar consolidation or effusion. No pneumothorax. Cardiomediastinal silhouette and remainder of the exam is unchanged. IMPRESSION: No acute cardiopulmonary disease. Electronically Signed   By: Toribio Agreste M.D.    On: 03/07/2024 14:16    Microbiology: Recent Results (from the past 240 hours)  Blood Culture (routine x 2)     Status: None (Preliminary result)   Collection Time: 03/07/24  1:40 PM   Specimen: BLOOD  Result Value Ref Range Status   Specimen Description BLOOD RIGHT ANTECUBITAL  Final   Special Requests   Final    BOTTLES DRAWN AEROBIC AND ANAEROBIC Blood Culture results may not be optimal due to an inadequate volume of blood received in culture bottles   Culture   Final    NO GROWTH 3 DAYS Performed at [facility], [address] [place], [place] [address]  Report Status PENDING  Incomplete  Blood Culture (routine x 2)     Status: None (Preliminary result)   Collection Time: 03/07/24  1:40 PM   Specimen: BLOOD  Result Value Ref Range Status   Specimen Description BLOOD BLOOD LEFT FOREARM  Final   Special Requests   Final    BOTTLES DRAWN AEROBIC AND ANAEROBIC Blood Culture results may not be optimal due to an inadequate volume of blood received in culture bottles   Culture   Final    NO GROWTH 3 DAYS Performed at [facility], [address], [place], [place] [address]    Report Status PENDING  Incomplete  Resp panel by RT-PCR (RSV, Flu A&B, Covid) Anterior Nasal Swab     Status: None   Collection Time: 03/07/24  3:09 PM   Specimen: Anterior Nasal Swab  Result Value Ref Range Status   SARS Coronavirus 2 by RT PCR NEGATIVE NEGATIVE Final    Comment: (NOTE) SARS-CoV-2 target nucleic acids are NOT DETECTED.  The SARS-CoV-2 RNA is generally detectable in upper respiratory specimens during the acute phase of infection. The lowest concentration of SARS-CoV-2 viral copies this assay can detect is 138 copies/mL. A negative result does not preclude SARS-Cov-2 infection and should not be used as the sole basis for treatment or other patient management decisions. A negative result may occur with  improper specimen collection/handling, submission of specimen  other than nasopharyngeal swab, presence of viral mutation(s) within the areas targeted by this assay, and inadequate number of viral copies(<138 copies/mL). A negative result must be combined with clinical observations, patient history, and epidemiological information. The expected result is Negative.  Fact Sheet for Patients:  bloggercourse.com  Fact Sheet for Healthcare Providers:  seriousbroker.it  This test is no t yet approved or cleared by the [place]  FDA and  has been authorized for detection and/or diagnosis of SARS-CoV-2 by FDA under an Emergency Use Authorization (EUA). This EUA will remain  in effect (meaning this test can be used) for the duration of the COVID-19 declaration under Section 564(b)(1) of the Act, 21 U.S.C.section 360bbb-3(b)(1), unless the authorization is terminated  or revoked sooner.       Influenza A by PCR NEGATIVE NEGATIVE Final   Influenza B by PCR NEGATIVE NEGATIVE Final    Comment: (NOTE) The Xpert Xpress SARS-CoV-2/FLU/RSV plus assay is intended as an aid in the diagnosis of influenza from Nasopharyngeal swab specimens and should not be used as a sole basis for treatment. Nasal washings and aspirates are unacceptable for Xpert Xpress SARS-CoV-2/FLU/RSV testing.  Fact Sheet for Patients: bloggercourse.com  Fact Sheet for Healthcare Providers: seriousbroker.it  This test is not yet approved or cleared by the [place]  FDA and has been authorized for detection and/or diagnosis of SARS-CoV-2 by FDA under an Emergency Use Authorization (EUA). This EUA will remain in effect (meaning this test can be used) for the duration of the COVID-19 declaration under Section 564(b)(1) of the Act, 21 U.S.C. section 360bbb-3(b)(1), unless the authorization is terminated or revoked.     Resp Syncytial Virus by PCR NEGATIVE NEGATIVE Final     Comment: (NOTE) Fact Sheet for Patients: bloggercourse.com  Fact Sheet for Healthcare Providers: seriousbroker.it  This test is not yet approved or cleared by the [place]  FDA and has been authorized for detection and/or diagnosis of SARS-CoV-2 by FDA under an Emergency Use Authorization (EUA). This EUA will remain in effect (meaning this test can be used) for the duration of the  COVID-19 declaration under Section 564(b)(1) of the Act, 21 U.S.C. section 360bbb-3(b)(1), unless the authorization is terminated or revoked.  Performed at [facility], [address] [place], [place] [address]   MRSA Next Gen by PCR, Nasal     Status: None   Collection Time: 03/07/24  4:38 PM   Specimen: Nasal Mucosa; Nasal Swab  Result Value Ref Range Status   MRSA by PCR Next Gen NOT DETECTED NOT DETECTED Final    Comment: (NOTE) The GeneXpert MRSA Assay (FDA approved for NASAL specimens only), is one component of a comprehensive MRSA colonization surveillance program. It is not intended to diagnose MRSA infection nor to guide or monitor treatment for MRSA infections. Test performance is not FDA approved in patients less than 73 years old. Performed at [facility], [address] [place], [place] [address]   Culture, Respiratory w Gram Stain     Status: None (Preliminary result)   Collection Time: 03/07/24  4:55 PM   Specimen: Tracheal Aspirate; Respiratory  Result Value Ref Range Status   Specimen Description   Final    TRACHEAL ASPIRATE Performed at [facility], [address] [place], [place] [address]    Special Requests   Final    NONE Performed at [facility], [address] [place], [place] [address]    Gram Stain   Final    RARE WBC PRESENT,BOTH PMN AND MONONUCLEAR NO ORGANISMS SEEN    Culture   Final    NO GROWTH 2 DAYS Performed at [facility], [address],  [place], [place] [address]    Report Status PENDING  Incomplete  Respiratory (~20 pathogens) panel by PCR     Status: None   Collection Time: 03/07/24  6:15 PM  Result Value Ref Range Status   Adenovirus NOT DETECTED NOT DETECTED Final   Coronavirus 229E NOT DETECTED NOT DETECTED Final    Comment: (NOTE) The Coronavirus on the Respiratory Panel, DOES NOT test for the novel  Coronavirus (2019 nCoV)    Coronavirus HKU1 NOT DETECTED NOT DETECTED Final   Coronavirus NL63 NOT DETECTED NOT DETECTED Final   Coronavirus OC43 NOT DETECTED NOT DETECTED Final   Metapneumovirus NOT DETECTED NOT DETECTED Final   Rhinovirus / Enterovirus NOT DETECTED NOT DETECTED Final   Influenza A NOT DETECTED NOT DETECTED Final   Influenza B NOT DETECTED NOT DETECTED Final   Parainfluenza Virus 1 NOT DETECTED NOT DETECTED Final   Parainfluenza Virus 2 NOT DETECTED NOT DETECTED Final   Parainfluenza Virus 3 NOT DETECTED NOT DETECTED Final   Parainfluenza Virus 4 NOT DETECTED NOT DETECTED Final   Respiratory Syncytial Virus NOT DETECTED NOT DETECTED Final   Bordetella pertussis NOT DETECTED NOT DETECTED Final   Bordetella Parapertussis NOT DETECTED NOT DETECTED Final   Chlamydophila pneumoniae NOT DETECTED NOT DETECTED Final   Mycoplasma pneumoniae NOT DETECTED NOT DETECTED Final    Comment: Performed at [facility], [address], [place], [place] [address]     Labs: Basic Metabolic Panel: Recent Labs  Lab 03/07/24 1318 03/07/24 2148 03/08/24 0321 03/09/24 0824  NA 135  --  134* 141  K 5.3* 5.1 5.3* 4.2  CL 95*  --  96* 99  CO2 35*  --  29 34*  GLUCOSE 129*  --  121* 79  BUN 13  --  21 22  CREATININE 0.64  --  0.84 0.73  CALCIUM  9.2  --  8.7* 9.1  MG  --   --  1.9 2.1  PHOS  --   --  3.1 2.6   Liver Function Tests: Recent Labs  Lab 03/07/24 1318 03/08/24 0321 03/09/24 0824  AST 12*  --   --   ALT 8  --   --   ALKPHOS 82  --   --   BILITOT 0.4  --   --   PROT 7.2  --   --   ALBUMIN  4.2 3.4* 3.7   No results for input(s): LIPASE, AMYLASE in the last 168 hours. No results for input(s): AMMONIA in the last 168 hours. CBC: Recent Labs  Lab 03/07/24 1318 03/08/24 0321 03/09/24 0824  WBC 8.2 4.1 9.5  NEUTROABS 6.4  --   --   HGB 15.4 13.5 15.1  HCT 48.8 41.5 45.6  MCV 102.5* 97.6 97.6  PLT 242 197 240   Cardiac Enzymes: No results for input(s): CKTOTAL, CKMB, CKMBINDEX, TROPONINI in the last 168 hours. BNP: BNP (last 3 results) Recent Labs    10/01/23 0844  BNP 77.2    ProBNP (last 3 results) No results for input(s): PROBNP in the last 8760 hours.  CBG: Recent Labs  Lab 03/09/24 1156 03/09/24 1625 03/09/24 2052 03/10/24 0839 03/10/24 1204  GLUCAP 75 103* 99 95 186*       Signed:  Devaughn KATHEE Ban MD.  [facility] 03/10/2024, 12:09 PM

## 2024-03-10 NOTE — Progress Notes (Signed)
 Mobility Specialist Progress Note:    03/10/24 0943  Mobility  Activity Ambulated with assistance  Level of Assistance Contact guard assist, steadying assist  Assistive Device Front wheel walker  Distance Ambulated (ft) 160 ft  Range of Motion/Exercises Active;All extremities  Activity Response Tolerated well  Mobility visit 1 Mobility  Mobility Specialist Start Time (ACUTE ONLY) [id number]  Mobility Specialist Stop Time (ACUTE ONLY) [id number]  Mobility Specialist Time Calculation (min) (ACUTE ONLY) 33 min   Pt received in bed, requesting assistance to ambulate. Required CGA to stand and ambulate with RW. Upon standing, pt has 1 LOB; able to independently correct and denies dizziness. Tolerated well, see O2 sats below. SpO2 87% on 2L after returning to room, recovered to 91% on 2L through rest and deep breathing techniques. Left pt in fowlers, alarm on and belongings in reach. All needs met.  SpO2 93% on 2L before mobility SpO2 88-90% on 2L during ambulation  Sherrilee Ditty Mobility Specialist Please contact via SecureChat or  Rehab office at [phone]

## 2024-03-10 NOTE — TOC Progression Note (Signed)
 Transition of Care [facility]) - Progression Note    Patient Details  Name: Stephen [name] MRN: [id number] Date of Birth: [birth year]  Transition of Care [facility]) CM/SW Contact  Victory Jackquline RAMAN, RN Phone Number: 03/10/2024, 12:08 PM  Clinical Narrative:     11:39 am: [facility] received a message from the MD via secure chat informing me that the patient need oxygen ordered. Gave the MD and bedside nurse the verbiage for why patient needs home oxygen that needs to be co-signed by the MD. Email sent to [employer] [facility] for Oxygen to be delivered to bedside.RNCM will continue to follow for discharge planning needs.                    Expected Discharge Plan and Services         Expected Discharge Date: 03/10/24                                     Social Drivers of Health (SDOH) Interventions SDOH Screenings   Food Insecurity: Unknown (03/09/2024)  Housing: Low Risk  (03/09/2024)  Transportation Needs: No Transportation Needs (03/09/2024)  Utilities: Not At Risk (03/09/2024)  Alcohol Screen: Low Risk  (08/18/2022)  Depression (PHQ2-9): Low Risk  (12/14/2022)  Financial Resource Strain: Medium Risk (08/29/2023)   Received from [facility]  Physical Activity: Inactive (08/18/2022)  Social Connections: Unknown (03/09/2024)  Stress: No Stress Concern Present (08/18/2022)  Tobacco Use: High Risk (12/11/2023)   Received from [facility]    Readmission Risk Interventions     No data to display

## 2024-03-10 NOTE — Plan of Care (Signed)

## 2024-03-10 NOTE — TOC Transition Note (Signed)
 Transition of Care [facility]) - Discharge Note   Patient Details  Name: Stephen [name] MRN: [id number] Date of Birth: 1956/03/18  Transition [facility]) CM/SW Contact:  Victory Jackquline RAMAN, RN Phone Number: 03/10/2024, 2:29 PM   Clinical Narrative:  RNCM received a message via secure chat informing me that the patient's oxygen was delivered to bedside. RNCM called patient to confirm that he had a ride home and he said that his son was picking him up and would be here in 30 minutes. Patient has orders to discharge home, no further concerns. RNCM signing off.    Final next level of care: Home/Self Care Barriers to Discharge: Barriers Resolved   Patient Goals and CMS Choice            Discharge Placement                Patient to be transferred to facility by: Son Name of family member notified: [name] Patient and family notified of of transfer: 03/10/24  Discharge Plan and Services Additional resources added to the After Visit Summary for                  DME Arranged: Oxygen DME Agency: [facility] Date DME Agency Contacted: 03/10/24   Representative spoke with at DME Agency: Email sent to [employer] for Oxygen            Social Drivers of Health (SDOH) Interventions SDOH Screenings   Food Insecurity: Unknown (03/09/2024)  Housing: Low Risk  (03/09/2024)  Transportation Needs: No Transportation Needs (03/09/2024)  Utilities: Not At Risk (03/09/2024)  Alcohol Screen: Low Risk  (08/18/2022)  Depression (PHQ2-9): Low Risk  (12/14/2022)  Financial Resource Strain: Medium Risk (08/29/2023)   Received from [facility]  Physical Activity: Inactive (08/18/2022)  Social Connections: Unknown (03/09/2024)  Stress: No Stress Concern Present (08/18/2022)  Tobacco Use: High Risk (12/11/2023)   Received from [facility]     Readmission Risk Interventions     No data to display

## 2024-03-10 NOTE — Progress Notes (Signed)
 NAME:  Stephen [name], MRN:  [id number], DOB:  1955/06/01, LOS: 3 ADMISSION DATE:  03/07/2024, CONSULTATION DATE:  03/07/2024 REFERRING MD:  Dr. Nicholaus, CHIEF COMPLAINT:  Acute Respiratory Distress   Brief Pt Description / Synopsis:  68 y.o. male with PMHx significant for COPD admitted with Acute Hypoxic and Hypercapnic Respiratory Failure due to Acute COPD Exacerbation, failed trial of BiPAP requiring intubation and mechanical ventilation.   History of Present Illness:  Stephen [name] is a 68 year old male with past medical history significant for COPD who presented to [facility] ED on 12//25 due to acute respiratory distress.  Patient is currently intubated and sedated and no family currently available, therefore history is obtained from chart review.  It is reported that he had not been feeling well for the past few days.  Today EMS was dispatched due to shortness of breath, and upon their arrival he was noted to be hypoxic with O2 saturations in the 70s on room air along with poor air movement.  He was noted to be somnolent, but able to arouse.  He denied chest pain, cough, fever, chills.  On arrival to the ED he remained somnolent but interactive, along with tachypnea and shallow respirations at which he was placed on BiPAP.  Despite BiPAP therapy, ABG exhibited worsening respiratory acidosis ultimately requiring intubation.  Of note he required admission in July 2025 due to acute hypercapnic respiratory failure due to acute COPD exacerbation, which he failed BiPAP requiring intubation at that time.  CTA was negative for PE  03/09/24- patient is sitting up in bed in no apparent distress.  Patient had his home klonopin , dalirersp, Breztri  restarted and has his foley removed.  I have dcd his pulmicort  and dcd glycopyrrolate  since he is now getting in aerosolized form via [employer]. Plan is for dc home in am.   03/10/24-  patient is close to baseline and we discussed home dc.  We discussed smoking  cessation.    ED Course: Initial Vital Signs: Temperature 98.4 F orally, pulse 103, respiratory rate 44, blood pressure 155/94, SpO2 92% Significant Labs: Potassium 5.3, chloride 95, bicarb 35, glucose 129 VBG: pH 7.21/pCO2 105/pO2 51/bicarb 42 ~follow-up VBG on BiPAP: pH 7.16/pCO2 116/pO2 45/bicarb 41.3 Imaging Chest X-ray>>IMPRESSION: No acute cardiopulmonary disease. Medications Administered: duo-nebs, 125 mg Solumedrol   PCCM asked to admit for further workup and treatment.  Please see Significant Hospital Events section below for full detailed hospital course.  03/08/24- patient passed SBT today and has been liberated off MV.   Pertinent  Medical History   Past Medical History:  Diagnosis Date   Allergy    seasonal   Anxiety    CAD in native artery    COPD (chronic obstructive pulmonary disease) (HCC)    Thoracic aortic atherosclerosis     Micro Data:  12/4: COVID/flu/RSV PCR>> negative 12/4: RVP>> 12/4: Blood cultures x2>> 12/4: Tracheal aspirate>>  Antimicrobials:   Anti-infectives (From admission, onward)    Start     Dose/Rate Route Frequency Ordered Stop   03/09/24 2000  azithromycin  (ZITHROMAX ) tablet 500 mg        500 mg Oral  Once 03/09/24 1109 03/09/24 2011   03/07/24 1700  azithromycin  (ZITHROMAX ) 500 mg in sodium chloride  0.9 % 250 mL IVPB  Status:  Discontinued        500 mg 250 mL/hr over 60 Minutes Intravenous Every 24 hours 03/07/24 1637 03/09/24 1109       Significant Hospital Events: Including procedures, antibiotic start and  stop dates in addition to other pertinent events   12/4: Presented to ED with Acute Respiratory Distress, placed on BiPAP.  Somnolent, with worsening respiratory acidosis despite BiPAP requiring intubation and mechanical ventilation.  PCCM asked to admit.   Interim History / Subjective:  As outlined above under Significant Hospital Events section  Objective   Blood pressure 117/69, pulse 86, temperature 97.9 F  (36.6 C), temperature source Oral, resp. rate 18, height 5' 10 (1.778 m), weight 85.1 kg, SpO2 96%.        Intake/Output Summary (Last 24 hours) at 03/10/2024 0837 Last data filed at 03/10/2024 0038 Gross per 24 hour  Intake 380 ml  Output 550 ml  Net -170 ml   Filed Weights   03/07/24 1319 03/08/24 0500 03/09/24 0500  Weight: 90 kg 85.3 kg 85.1 kg    Examination: General: Critically on chronically ill-appearing male, laying in bed, intubated and sedated, no acute distress HENT: Atraumatic, normocephalic, neck supple, no JVD, orally intubated Lungs: Diminished breath sounds throughout, even, nonlabored, synchronous with the vent Cardiovascular: Tachycardic, regular rhythm, S1-S2, no murmurs, rubs, gallops Abdomen: Soft, nontender, nondistended, no guarding or rebound tenderness, bowel sounds positive x 4 Extremities: Normal bulk and tone, no deformities, no edema, warm and well-perfused Neuro: Sedated, currently does not withdraw from painful stimuli or follow commands, pupils PERRLA GU: Foley catheter in place draining yellow urine  Resolved Hospital Problem list     Assessment & Plan:    #Acute COPD Exacerbation -s/p extubation 03/08/24 patient is now more lucid - will repeat VBG today to confirm absence of hypercapnic failure -may be able to dc home tommorow  #Mild Hyperkalemia -Monitor I&O's / urinary output -Follow BMP -Ensure adequate renal perfusion -Avoid nephrotoxic agents as able -Replace electrolytes as indicated ~ Pharmacy following for assistance with electrolyte replacement  #Hyperglycemia -CBG's q4h; Target range of 140 to 180 -SSI -Follow ICU Hypo/Hyperglycemia protocol  #Acute Metabolic Encephalopathy due to CO2 Narcosis-RESOLVED     Best Practice (right click and Reselect all SmartList Selections daily)   Diet/type: NPO DVT prophylaxis: prophylactic heparin   GI prophylaxis: PPI Lines: N/A Foley:  Yes, and it is still needed Code Status:   DNR (see Dr. Braxton IPAL note) Last date of multidisciplinary goals of care discussion [N/A]   Labs   CBC: Recent Labs  Lab 03/07/24 1318 03/08/24 0321 03/09/24 0824  WBC 8.2 4.1 9.5  NEUTROABS 6.4  --   --   HGB 15.4 13.5 15.1  HCT 48.8 41.5 45.6  MCV 102.5* 97.6 97.6  PLT 242 197 240    Basic Metabolic Panel: Recent Labs  Lab 03/07/24 1318 03/07/24 2148 03/08/24 0321 03/09/24 0824  NA 135  --  134* 141  K 5.3* 5.1 5.3* 4.2  CL 95*  --  96* 99  CO2 35*  --  29 34*  GLUCOSE 129*  --  121* 79  BUN 13  --  21 22  CREATININE 0.64  --  0.84 0.73  CALCIUM  9.2  --  8.7* 9.1  MG  --   --  1.9 2.1  PHOS  --   --  3.1 2.6   GFR: Estimated Creatinine Clearance: 91.3 mL/min (by C-G formula based on SCr of 0.73 mg/dL). Recent Labs  Lab 03/07/24 1318 03/07/24 1759 03/07/24 2148 03/08/24 0321 03/09/24 0824  PROCALCITON  --  <0.10  --   --   --   WBC 8.2  --   --  4.1 9.5  LATICACIDVEN  0.7  --  1.4  --   --     Liver Function Tests: Recent Labs  Lab 03/07/24 1318 03/08/24 0321 03/09/24 0824  AST 12*  --   --   ALT 8  --   --   ALKPHOS 82  --   --   BILITOT 0.4  --   --   PROT 7.2  --   --   ALBUMIN 4.2 3.4* 3.7   No results for input(s): LIPASE, AMYLASE in the last 168 hours. No results for input(s): AMMONIA in the last 168 hours.  ABG    Component Value Date/Time   PHART 7.38 03/08/2024 0401   PCO2ART 56 (H) 03/08/2024 0401   PO2ART 64 (L) 03/08/2024 0401   HCO3 37.0 (H) 03/09/2024 1227   O2SAT 95.1 03/09/2024 1227     Coagulation Profile: No results for input(s): INR, PROTIME in the last 168 hours.  Cardiac Enzymes: No results for input(s): CKTOTAL, CKMB, CKMBINDEX, TROPONINI in the last 168 hours.  HbA1C: Hgb A1c MFr Bld  Date/Time Value Ref Range Status  03/08/2024 03:21 AM 5.8 (H) 4.8 - 5.6 % Final    Comment:    (NOTE)         Prediabetes: 5.7 - 6.4         Diabetes: >6.4         Glycemic control for adults with  diabetes: <7.0   08/09/2022 04:01 PM 6.5 (H) <5.7 % of total Hgb Final    Comment:    For someone without known diabetes, a hemoglobin A1c value of 6.5% or greater indicates that they may have  diabetes and this should be confirmed with a follow-up  test. . For someone with known diabetes, a value <7% indicates  that their diabetes is well controlled and a value  greater than or equal to 7% indicates suboptimal  control. A1c targets should be individualized based on  duration of diabetes, age, comorbid conditions, and  other considerations. . Currently, no consensus exists regarding use of hemoglobin A1c for diagnosis of diabetes for children. .     CBG: Recent Labs  Lab 03/09/24 0421 03/09/24 0808 03/09/24 1156 03/09/24 1625 03/09/24 2052  GLUCAP 88 84 75 103* 99    Review of Systems:   Unable to assess due to intubation/sedation/critical illness   Past Medical History:  He,  has a past medical history of Allergy, Anxiety, CAD in native artery, COPD (chronic obstructive pulmonary disease) (HCC), and Thoracic aortic atherosclerosis.   Surgical History:  No past surgical history on file.   Social History:   reports that he has been smoking cigarettes. He has a 45 pack-year smoking history. He has never used smokeless tobacco. He reports that he does not drink alcohol and does not use drugs.   Family History:  His family history includes Alzheimer's disease in his brother and father; Hypertension in his mother; Hypothyroidism in his mother.   Allergies No Known Allergies   Home Medications  Prior to Admission medications   Medication Sig Start Date End Date Taking? Authorizing Provider  albuterol  (VENTOLIN  HFA) 108 (90 Base) MCG/ACT inhaler INHALE 2 PUFFS INTO THE LUNGS EVERY 6 HOURS AS NEEDED FOR WHEEZING ORSHORTNESS OF BREATH 08/25/21  Yes Sowles, Krichna, MD  BREZTRI  AEROSPHERE 160-9-4.8 MCG/ACT AERO inhaler Inhale 2 puffs into the lungs 2 (two) times daily.  07/20/23 07/19/24 Yes [provider]  clonazePAM  (KLONOPIN ) 1 MG tablet Take 0.5-1 mg by mouth 2 (two) times daily  as needed for anxiety.   Yes [provider]  Fluticasone-Umeclidin-Vilant (TRELEGY ELLIPTA ) 100-62.5-25 MCG/ACT AEPB Inhale 1 puff into the lungs daily. 12/14/22  Yes Sowles, Krichna, MD  ipratropium-albuterol  (DUONEB) 0.5-2.5 (3) MG/3ML SOLN Inhale 3 mLs into the lungs 3 (three) times daily. 06/26/23 06/20/24 Yes [provider]  Methylcobalamin (B-12) 1000 MCG TBDP PLACE 1 TABLET UNDER THE TONGUE DAILY 08/15/22  Yes Sowles, Krichna, MD  nicotine  (NICODERM CQ  - DOSED IN MG/24 HOURS) 21 mg/24hr patch Place 1 patch (21 mg total) onto the skin daily. 10/06/23  Yes Wouk, Devaughn Sayres, MD  roflumilast  (DALIRESP ) 500 MCG TABS tablet Take 500 mcg by mouth daily.   Yes [provider]  tamsulosin  (FLOMAX ) 0.4 MG CAPS capsule Take 2 capsules (0.8 mg total) by mouth daily. 10/05/23  Yes Wouk, Devaughn Sayres, MD  torsemide (DEMADEX) 5 MG tablet Take 5 mg by mouth daily as needed. 10/24/23 10/23/24 Yes [provider]  aspirin  EC 81 MG tablet Take 1 tablet (81 mg total) by mouth daily. Swallow whole. Patient not taking: Reported on 10/01/2023 08/25/21   Sowles, Krichna, MD  buPROPion  (WELLBUTRIN  XL) 150 MG 24 hr tablet Take 150 mg by mouth daily. Patient not taking: Reported on 03/07/2024 05/16/23   [provider]  rosuvastatin  (CRESTOR ) 10 MG tablet Take 1 tablet (10 mg total) by mouth daily. Patient not taking: Reported on 10/01/2023 12/14/22   Sowles, Krichna, MD  traZODone (DESYREL) 100 MG tablet Take 100 mg by mouth at bedtime. Patient not taking: Reported on 03/07/2024 07/13/23   [provider]    Halina Picking, M.D.  Pulmonary & Critical Care Medicine

## 2024-03-11 LAB — CULTURE, RESPIRATORY W GRAM STAIN: Culture: NO GROWTH

## 2024-03-12 LAB — CULTURE, BLOOD (ROUTINE X 2)
Culture: NO GROWTH
Culture: NO GROWTH

## 2024-04-05 ENCOUNTER — Emergency Department

## 2024-04-05 ENCOUNTER — Observation Stay (HOSPITAL_BASED_OUTPATIENT_CLINIC_OR_DEPARTMENT_OTHER)
Admission: EM | Admit: 2024-04-05 | Discharge: 2024-04-07 | Discharge status: Against Medical Advice | Source: Home / Self Care | Attending: Emergency Medicine | Admitting: Emergency Medicine

## 2024-04-05 ENCOUNTER — Other Ambulatory Visit: Payer: Self-pay

## 2024-04-05 DIAGNOSIS — J9621 Acute and chronic respiratory failure with hypoxia: Secondary | ICD-10-CM | POA: Diagnosis not present

## 2024-04-05 DIAGNOSIS — I10 Essential (primary) hypertension: Secondary | ICD-10-CM | POA: Insufficient documentation

## 2024-04-05 DIAGNOSIS — Z5329 Procedure and treatment not carried out because of patient's decision for other reasons: Secondary | ICD-10-CM | POA: Insufficient documentation

## 2024-04-05 DIAGNOSIS — R0602 Shortness of breath: Principal | ICD-10-CM

## 2024-04-05 DIAGNOSIS — J9622 Acute and chronic respiratory failure with hypercapnia: Principal | ICD-10-CM | POA: Insufficient documentation

## 2024-04-05 DIAGNOSIS — R0689 Other abnormalities of breathing: Secondary | ICD-10-CM

## 2024-04-05 DIAGNOSIS — E782 Mixed hyperlipidemia: Secondary | ICD-10-CM | POA: Insufficient documentation

## 2024-04-05 DIAGNOSIS — F17210 Nicotine dependence, cigarettes, uncomplicated: Secondary | ICD-10-CM | POA: Insufficient documentation

## 2024-04-05 DIAGNOSIS — I2510 Atherosclerotic heart disease of native coronary artery without angina pectoris: Secondary | ICD-10-CM | POA: Insufficient documentation

## 2024-04-05 DIAGNOSIS — E119 Type 2 diabetes mellitus without complications: Secondary | ICD-10-CM | POA: Insufficient documentation

## 2024-04-05 DIAGNOSIS — J441 Chronic obstructive pulmonary disease with (acute) exacerbation: Secondary | ICD-10-CM | POA: Insufficient documentation

## 2024-04-05 DIAGNOSIS — Z79899 Other long term (current) drug therapy: Secondary | ICD-10-CM | POA: Insufficient documentation

## 2024-04-05 DIAGNOSIS — E1169 Type 2 diabetes mellitus with other specified complication: Secondary | ICD-10-CM | POA: Diagnosis present

## 2024-04-05 DIAGNOSIS — J449 Chronic obstructive pulmonary disease, unspecified: Secondary | ICD-10-CM | POA: Diagnosis present

## 2024-04-05 DIAGNOSIS — F17200 Nicotine dependence, unspecified, uncomplicated: Secondary | ICD-10-CM | POA: Diagnosis present

## 2024-04-05 LAB — RESP PANEL BY RT-PCR (RSV, FLU A&B, COVID)  RVPGX2
Influenza A by PCR: NEGATIVE
Influenza B by PCR: NEGATIVE
Resp Syncytial Virus by PCR: NEGATIVE
SARS Coronavirus 2 by RT PCR: NEGATIVE

## 2024-04-05 LAB — BLOOD GAS, VENOUS
Acid-Base Excess: 16.4 mmol/L — ABNORMAL HIGH (ref 0.0–2.0)
Acid-Base Excess: 11.1 mmol/L — ABNORMAL HIGH (ref 0.0–2.0)
Acid-Base Excess: 11.9 mmol/L — ABNORMAL HIGH (ref 0.0–2.0)
Bicarbonate: 45.6 mmol/L — ABNORMAL HIGH (ref 20.0–28.0)
Bicarbonate: 39.3 mmol/L — ABNORMAL HIGH (ref 20.0–28.0)
Bicarbonate: 38.9 mmol/L — ABNORMAL HIGH (ref 20.0–28.0)
O2 Saturation: 70.7 %
O2 Saturation: 94 %
O2 Saturation: 92.2 %
Patient temperature: 37
Patient temperature: 37
Patient temperature: 37
pCO2, Ven: 77 mmHg (ref 44–60)
pCO2, Ven: 68 mmHg — ABNORMAL HIGH (ref 44–60)
pCO2, Ven: 60 mmHg (ref 44–60)
pH, Ven: 7.38 (ref 7.25–7.43)
pH, Ven: 7.37 (ref 7.25–7.43)
pH, Ven: 7.42 (ref 7.25–7.43)
pO2, Ven: 41 mmHg (ref 32–45)
pO2, Ven: 62 mmHg — ABNORMAL HIGH (ref 32–45)
pO2, Ven: 58 mmHg — ABNORMAL HIGH (ref 32–45)

## 2024-04-05 LAB — COMPREHENSIVE METABOLIC PANEL WITH GFR
ALT: 9 U/L (ref 0–44)
AST: 17 U/L (ref 15–41)
Albumin: 3.7 g/dL (ref 3.5–5.0)
Alkaline Phosphatase: 66 U/L (ref 38–126)
Anion gap: 6 (ref 5–15)
BUN: 8 mg/dL (ref 8–23)
CO2: 36 mmol/L — ABNORMAL HIGH (ref 22–32)
Calcium: 9.4 mg/dL (ref 8.9–10.3)
Chloride: 94 mmol/L — ABNORMAL LOW (ref 98–111)
Creatinine, Ser: 0.59 mg/dL — ABNORMAL LOW (ref 0.61–1.24)
GFR, Estimated: >60 mL/min
Glucose, Bld: 96 mg/dL (ref 70–99)
Potassium: 4.7 mmol/L (ref 3.5–5.1)
Sodium: 135 mmol/L (ref 135–145)
Total Bilirubin: 0.6 mg/dL (ref 0.0–1.2)
Total Protein: 6.4 g/dL — ABNORMAL LOW (ref 6.5–8.1)

## 2024-04-05 LAB — CBC
HCT: 42.3 % (ref 39.0–52.0)
Hemoglobin: 13.9 g/dL (ref 13.0–17.0)
MCH: 32.3 pg (ref 26.0–34.0)
MCHC: 32.9 g/dL (ref 30.0–36.0)
MCV: 98.4 fL (ref 80.0–100.0)
Platelets: 194 K/uL (ref 150–400)
RBC: 4.3 MIL/uL (ref 4.22–5.81)
RDW: 12.3 % (ref 11.5–15.5)
WBC: 6.1 K/uL (ref 4.0–10.5)
nRBC: 0 % (ref 0.0–0.2)

## 2024-04-05 LAB — TROPONIN T, HIGH SENSITIVITY: Troponin T High Sensitivity: 36 ng/L — ABNORMAL HIGH (ref 0–19)

## 2024-04-05 LAB — MAGNESIUM: Magnesium: 1.8 mg/dL (ref 1.7–2.4)

## 2024-04-05 LAB — CBG MONITORING, ED: Glucose-Capillary: 143 mg/dL — ABNORMAL HIGH (ref 70–99)

## 2024-04-05 MED ORDER — ONDANSETRON HCL 4 MG/2ML IJ SOLN: 4.0000 mg | Freq: Four times a day (QID) | INTRAMUSCULAR | Status: DC | PRN

## 2024-04-05 MED ORDER — INSULIN ASPART 100 UNIT/ML IJ SOLN
0.0000 [IU] | Freq: Three times a day (TID) | INTRAMUSCULAR | Status: DC
  Filled 2024-04-05: qty 1

## 2024-04-05 MED ORDER — HEPARIN SODIUM (PORCINE) 5000 UNIT/ML IJ SOLN
5000.0000 [IU] | Freq: Three times a day (TID) | INTRAMUSCULAR | Status: DC
  Administered 2024-04-05 – 2024-04-07 (×5): 5000 [IU] via SUBCUTANEOUS
  Filled 2024-04-05 (×5): qty 1

## 2024-04-05 MED ORDER — LORAZEPAM 2 MG/ML IJ SOLN
0.2500 mg | INTRAMUSCULAR | Status: DC | PRN
  Administered 2024-04-05: 0.25 mg via INTRAVENOUS
  Filled 2024-04-05: qty 1

## 2024-04-05 MED ORDER — ACETAMINOPHEN 325 MG PO TABS: 650.0000 mg | ORAL_TABLET | Freq: Four times a day (QID) | ORAL | Status: DC | PRN

## 2024-04-05 MED ORDER — SENNOSIDES-DOCUSATE SODIUM 8.6-50 MG PO TABS: 1.0000 | ORAL_TABLET | Freq: Every evening | ORAL | Status: DC | PRN

## 2024-04-05 MED ORDER — LACTATED RINGERS IV SOLN: INTRAVENOUS | Status: AC

## 2024-04-05 MED ORDER — ASPIRIN 81 MG PO TBEC
81.0000 mg | DELAYED_RELEASE_TABLET | Freq: Every day | ORAL | Status: DC
  Administered 2024-04-05 – 2024-04-07 (×3): 81 mg via ORAL
  Filled 2024-04-05 (×3): qty 1

## 2024-04-05 MED ORDER — ACETAMINOPHEN 650 MG RE SUPP: 650.0000 mg | Freq: Four times a day (QID) | RECTAL | Status: DC | PRN

## 2024-04-05 MED ORDER — IBUPROFEN 400 MG PO TABS
400.0000 mg | ORAL_TABLET | Freq: Once | ORAL | Status: DC
  Filled 2024-04-05: qty 1

## 2024-04-05 MED ORDER — IPRATROPIUM-ALBUTEROL 0.5-2.5 (3) MG/3ML IN SOLN
3.0000 mL | Freq: Four times a day (QID) | RESPIRATORY_TRACT | Status: DC
  Administered 2024-04-05 – 2024-04-06 (×5): 3 mL via RESPIRATORY_TRACT
  Filled 2024-04-05 (×4): qty 3

## 2024-04-05 MED ORDER — SODIUM CHLORIDE 0.9% FLUSH
3.0000 mL | Freq: Two times a day (BID) | INTRAVENOUS | Status: DC
  Administered 2024-04-05 – 2024-04-07 (×5): 3 mL via INTRAVENOUS

## 2024-04-05 MED ORDER — NICOTINE 14 MG/24HR TD PT24
14.0000 mg | MEDICATED_PATCH | Freq: Every day | TRANSDERMAL | Status: DC
  Administered 2024-04-05 – 2024-04-07 (×3): 14 mg via TRANSDERMAL
  Filled 2024-04-05 (×3): qty 1

## 2024-04-05 MED ORDER — CLONAZEPAM 0.5 MG PO TABS: 1.0000 mg | ORAL_TABLET | Freq: Two times a day (BID) | ORAL | Status: DC | PRN

## 2024-04-05 MED ORDER — METHYLPREDNISOLONE SODIUM SUCC 125 MG IJ SOLR
125.0000 mg | Freq: Once | INTRAMUSCULAR | Status: AC
  Administered 2024-04-05: 125 mg via INTRAVENOUS
  Filled 2024-04-05: qty 2

## 2024-04-05 MED ORDER — ROSUVASTATIN CALCIUM 10 MG PO TABS
10.0000 mg | ORAL_TABLET | Freq: Every day | ORAL | Status: DC
  Administered 2024-04-06 – 2024-04-07 (×2): 10 mg via ORAL
  Filled 2024-04-05 (×2): qty 1

## 2024-04-05 MED ORDER — ONDANSETRON HCL 4 MG PO TABS: 4.0000 mg | ORAL_TABLET | Freq: Four times a day (QID) | ORAL | Status: DC | PRN

## 2024-04-05 MED ORDER — IPRATROPIUM-ALBUTEROL 0.5-2.5 (3) MG/3ML IN SOLN
3.0000 mL | Freq: Once | RESPIRATORY_TRACT | Status: AC
  Administered 2024-04-05: 3 mL via RESPIRATORY_TRACT
  Filled 2024-04-05: qty 3

## 2024-04-05 MED ORDER — BUDESONIDE 0.5 MG/2ML IN SUSP
0.5000 mg | Freq: Two times a day (BID) | RESPIRATORY_TRACT | Status: DC
  Administered 2024-04-05 – 2024-04-06 (×2): 0.5 mg via RESPIRATORY_TRACT
  Filled 2024-04-05 (×2): qty 2

## 2024-04-05 MED ORDER — TRAZODONE HCL 100 MG PO TABS
100.0000 mg | ORAL_TABLET | Freq: Every day | ORAL | Status: DC
  Administered 2024-04-06: 100 mg via ORAL
  Filled 2024-04-05 (×2): qty 1

## 2024-04-05 MED ORDER — IPRATROPIUM-ALBUTEROL 0.5-2.5 (3) MG/3ML IN SOLN
3.0000 mL | Freq: Once | RESPIRATORY_TRACT | Status: AC
  Administered 2024-04-05: 3 mL via RESPIRATORY_TRACT
  Filled 2024-04-05: qty 6

## 2024-04-05 MED ORDER — INSULIN ASPART 100 UNIT/ML IJ SOLN: 0.0000 [IU] | Freq: Every day | INTRAMUSCULAR | Status: DC

## 2024-04-05 MED ORDER — BUDESONIDE 0.5 MG/2ML IN SUSP
0.5000 mg | Freq: Once | RESPIRATORY_TRACT | Status: AC
  Administered 2024-04-05: 0.5 mg via RESPIRATORY_TRACT
  Filled 2024-04-05: qty 2

## 2024-04-05 MED ORDER — PREDNISONE 20 MG PO TABS
40.0000 mg | ORAL_TABLET | Freq: Every day | ORAL | Status: DC
  Administered 2024-04-06 – 2024-04-07 (×2): 40 mg via ORAL
  Filled 2024-04-05 (×2): qty 2

## 2024-04-05 MED ORDER — LORAZEPAM 2 MG/ML IJ SOLN
0.2500 mg | INTRAMUSCULAR | Status: DC | PRN
  Administered 2024-04-05 – 2024-04-06 (×2): 0.25 mg via INTRAVENOUS
  Filled 2024-04-05 (×2): qty 1

## 2024-04-05 MED ORDER — LORAZEPAM 2 MG/ML IJ SOLN
0.2500 mg | Freq: Once | INTRAMUSCULAR | Status: AC
  Administered 2024-04-05: 0.25 mg via INTRAVENOUS
  Filled 2024-04-05: qty 1

## 2024-04-05 MED ORDER — AZITHROMYCIN 250 MG PO TABS
250.0000 mg | ORAL_TABLET | ORAL | Status: DC
  Administered 2024-04-06: 250 mg via ORAL
  Filled 2024-04-05 (×2): qty 1

## 2024-04-05 MED ORDER — TAMSULOSIN HCL 0.4 MG PO CAPS
0.8000 mg | ORAL_CAPSULE | Freq: Every day | ORAL | Status: DC
  Administered 2024-04-05: 0.8 mg via ORAL
  Administered 2024-04-06: 0.4 mg via ORAL
  Filled 2024-04-05 (×2): qty 2

## 2024-04-05 MED ORDER — ROFLUMILAST 500 MCG PO TABS
500.0000 ug | ORAL_TABLET | Freq: Every day | ORAL | Status: DC
  Administered 2024-04-06 – 2024-04-07 (×2): 500 ug via ORAL
  Filled 2024-04-05 (×2): qty 1

## 2024-04-05 NOTE — ED Notes (Addendum)
 Pt ambulated to in room toilet without assistance of staff. Pt denies lightheadedness or dizziness. Pt denies SOB. Pt c/o 6/10 headache. Pt back on stretcher and hooked back up to VS monitoring equipment. Call bell in reach and bed in lowest, locked position.

## 2024-04-05 NOTE — ED Notes (Signed)
 Patient walked from stretcher to toilet in room with no assistance. Patient did become slightly tachycardic and short of breath with ambulation. Patient given urinal to use for future toileting needs.

## 2024-04-05 NOTE — H&P (Signed)
 " History and Physical    Ventura Hollenbeck FMW:[id number] DOB: [birth year] DOA: 04/05/2024  DOS: the patient was seen and examined on 04/05/2024  PCP: Cleotilde Oneil FALCON, MD   Patient coming from: Home  I have personally briefly reviewed patient's old medical records in [facility] and CareEverywhere  HPI:   Dalante Minus is a 69 y.o. year old male with medical history of hypertension, hyperlipidemia, type 2 diabetes, CHF (EF 55%, G1 DD) and 07/2023 presenting to the ED with worsening shortness of breath along with confusion.  Patient states he has been having coughing with worsening sputum production.  Son who is at bedside states he is quite familiar with patient's respiratory decline as patient was intubated less than 1 month ago.  Her symptoms started off with worsening shortness of breath and confusion and then he required BiPAP and then that leads to him getting intubated.  Patient reports no URI symptoms but does report 1 pack/day/smoking.  He states he is amenable to quitting but son states this has been very difficult for the patient.  At the time of interview, patient is on BiPAP. On arrival to the ED patient was noted to be HDS stable.  CBC without leukocytosis or anemia.  CMP with mild hypokalemia and elevation of bicarb otherwise unremarkable.  Troponin mildly elevated with repeat pending.  Respiratory panel negative for COVID, flu, RSV.  VBG showed 7.3 8/77/41 and given significantly elevated bicarb patient was placed on BiPAP and [facility] was consulted for admission.  Case this without any acute findings   Review of Systems: As mentioned in the history of present illness. All other systems reviewed and are negative.   Past Medical History:  Diagnosis Date   Allergy    seasonal   Anxiety    CAD in native artery    COPD (chronic obstructive pulmonary disease) (HCC)    Thoracic aortic atherosclerosis     No past surgical history on file.   Allergies[1]  Family History  Problem  Relation Age of Onset   Hypertension Mother    Hypothyroidism Mother    Alzheimer's disease Father    Alzheimer's disease Brother     Prior to Admission medications  Medication Sig Start Date End Date Taking? Authorizing Provider  albuterol  (VENTOLIN  HFA) 108 (90 Base) MCG/ACT inhaler INHALE 2 PUFFS INTO THE LUNGS EVERY 6 HOURS AS NEEDED FOR WHEEZING ORSHORTNESS OF BREATH 03/10/24  Yes Wouk, Devaughn Sayres, MD  aspirin  EC 81 MG tablet Take 1 tablet (81 mg total) by mouth daily. Swallow whole. 08/25/21  Yes Sowles, Krichna, MD  azithromycin  (ZITHROMAX ) 250 MG tablet Take 250 mg by mouth every other day. 03/20/24 06/18/24 Yes [provider]  BREZTRI  AEROSPHERE 160-9-4.8 MCG/ACT AERO inhaler Inhale 2 puffs into the lungs 2 (two) times daily. 07/20/23 07/19/24 Yes [provider]  clonazePAM  (KLONOPIN ) 1 MG tablet Take 1 mg by mouth 2 (two) times daily as needed for anxiety.   Yes [provider]  ipratropium-albuterol  (DUONEB) 0.5-2.5 (3) MG/3ML SOLN Inhale 3 mLs into the lungs 3 (three) times daily. 06/26/23 06/20/24 Yes [provider]  Methylcobalamin (B-12) 1000 MCG TBDP PLACE 1 TABLET UNDER THE TONGUE DAILY 08/15/22  Yes Sowles, Krichna, MD  nicotine  (NICODERM CQ  - DOSED IN MG/24 HOURS) 21 mg/24hr patch Place 1 patch (21 mg total) onto the skin daily. 10/06/23  Yes Wouk, Devaughn Sayres, MD  roflumilast  (DALIRESP ) 500 MCG TABS tablet Take 500 mcg by mouth daily.   Yes [provider]  tamsulosin  (FLOMAX ) 0.4 MG CAPS capsule Take 2 capsules (0.8 mg total) by mouth daily. 10/05/23  Yes Wouk, Devaughn Sayres, MD  traZODone (DESYREL) 100 MG tablet Take 100 mg by mouth at bedtime. 07/13/23  Yes [provider]  buPROPion  (WELLBUTRIN  XL) 150 MG 24 hr tablet Take 150 mg by mouth daily. Patient not taking: Reported on 03/07/2024 05/16/23   [provider]  predniSONE  (DELTASONE ) 10 MG tablet 4 tabs daily for 3 days then 3 tabs daily for 3 days then 2 tabs  daily for 3 days then 1 tab daily for 3 days Patient not taking: Reported on 04/05/2024 03/10/24   Wouk, Devaughn Sayres, MD  rosuvastatin  (CRESTOR ) 10 MG tablet Take 1 tablet (10 mg total) by mouth daily. Patient not taking: Reported on 10/01/2023 12/14/22   Sowles, Krichna, MD  torsemide (DEMADEX) 5 MG tablet Take 5 mg by mouth daily as needed. Patient not taking: Reported on 04/05/2024 10/24/23 10/23/24  [provider]  varenicline (CHANTIX) 0.5 MG tablet Take 0.5 mg by mouth. Patient not taking: Reported on 04/05/2024 03/20/24 04/19/24  [provider]    Social History:  reports that he has been smoking cigarettes. He has a 45 pack-year smoking history. He has never used smokeless tobacco. He reports that he does not drink alcohol and does not use drugs.    Physical Exam: Vitals:   04/05/24 1330 04/05/24 1345 04/05/24 1400 04/05/24 1622  BP: (!) 112/92  (!) 147/80   Pulse: 93 84 82   Resp: (!) 34 17 15   Temp:    98 F (36.7 C)  TempSrc:    Oral  SpO2: 100% 100% 100%     Gen: NAD HENT: NCAT CV: normal heart sounds Lung: CTAB Abd: No TTP, normal bowel sounds MSK: No asymmetry, good bulk and tone Neuro: alert and oriented   Labs on Admission: I have personally reviewed following labs and imaging studies  CBC: Recent Labs  Lab 04/05/24 1232  WBC 6.1  HGB 13.9  HCT 42.3  MCV 98.4  PLT 194   Basic Metabolic Panel: Recent Labs  Lab 04/05/24 1232  NA 135  K 4.7  CL 94*  CO2 36*  GLUCOSE 96  BUN 8  CREATININE 0.59*  CALCIUM  9.4  MG 1.8   GFR: CrCl cannot be calculated (Unknown ideal weight.). Liver Function Tests: Recent Labs  Lab 04/05/24 1232  AST 17  ALT 9  ALKPHOS 66  BILITOT 0.6  PROT 6.4*  ALBUMIN 3.7   No results for input(s): LIPASE, AMYLASE in the last 168 hours. No results for input(s): AMMONIA in the last 168 hours. Coagulation Profile: No results for input(s): INR, PROTIME in the last 168 hours. Cardiac Enzymes: No  results for input(s): CKTOTAL, CKMB, CKMBINDEX, TROPONINI, TROPONINIHS in the last 168 hours. BNP (last 3 results) Recent Labs    10/01/23 0844  BNP 77.2   HbA1C: No results for input(s): HGBA1C in the last 72 hours. CBG: No results for input(s): GLUCAP in the last 168 hours. Lipid Profile: No results for input(s): CHOL, HDL, LDLCALC, TRIG, CHOLHDL, LDLDIRECT in the last 72 hours. Thyroid  Function Tests: No results for input(s): TSH, T4TOTAL, FREET4, T3FREE, THYROIDAB in the last 72 hours. Anemia Panel: No results for input(s): VITAMINB12, FOLATE, FERRITIN, TIBC, IRON, RETICCTPCT in the last 72 hours. Urine analysis:    Component Value Date/Time   COLORURINE AMBER (A) 03/07/2024 1815   APPEARANCEUR CLOUDY (A) 03/07/2024 1815   LABSPEC 1.026  03/07/2024 1815   PHURINE 5.0 03/07/2024 1815   GLUCOSEU NEGATIVE 03/07/2024 1815   HGBUR NEGATIVE 03/07/2024 1815   BILIRUBINUR NEGATIVE 03/07/2024 1815   KETONESUR NEGATIVE 03/07/2024 1815   PROTEINUR 100 (A) 03/07/2024 1815   NITRITE NEGATIVE 03/07/2024 1815   LEUKOCYTESUR NEGATIVE 03/07/2024 1815    Radiological Exams on Admission: I have personally reviewed images DG Chest Portable 1 View Result Date: 04/05/2024 EXAM: 1 VIEW(S) XRAY OF THE CHEST 04/05/2024 12:28:21 PM COMPARISON: 03/08/2024 CLINICAL HISTORY: sob FINDINGS: LINES, TUBES AND DEVICES: Endotracheal tube and enteric tube removed. LUNGS AND PLEURA: No focal pulmonary opacity. Bilateral trace pleural effusions. No pneumothorax. HEART AND MEDIASTINUM: Atherosclerotic plaque noted. No acute abnormality of the cardiac and mediastinal silhouettes. BONES AND SOFT TISSUES: No acute osseous abnormality. IMPRESSION: 1. Bilateral trace pleural effusions. 2. Interval removal of the endotracheal and enteric tube. Electronically signed by: Morgane Naveau MD 04/05/2024 01:33 PM EST RP Workstation: [id number]    EKG: My personal interpretation of  EKG shows: sinus rhythm without any acute ST changes.     Assessment/Plan Principal Problem:   Acute on chronic respiratory failure with hypoxia and hypercapnia (HCC) Active Problems:   Tobacco use disorder   Coronary artery calcification of native artery   COPD with acute exacerbation (HCC)   DM type 2 with diabetic mixed hyperlipidemia (HCC)   COPD Exacerbation Pt with cardinal symptoms of COPD exacerbation with recent history of intubation secondary to this.  Pt status post nebulizers, IV magnesium , and Solumedrol.  Currently he is on nebulizers given severe hypercapnia.  Will monitor repeat VBG and remove BiPAP if able to.  Discussed complete smoking cessation with the patient as recurrent COPD exacerbations can be life-threatening especially in his case. - Start prednisone  40mg  for 5 day course, defer starting azithromycin  as patient is on every other day azithromycin  for prophylaxis.  Will continue that. - DuoNebs q6hr - Keep O2 sat 88-92  Tobacco use disorder: Discussed complete cessation.  Patient is amenable.  Will need to continue discussion prior to discharge and start Chantix if patient is amenable.  Hyperlipidemia: Continue home statin, per patient he is not taking this as advised need for this given his CAD.   Type 2 diabetes: Not on any pharmacotherapy.  Will continue to monitor CBGs with SSI.  CHF: Not on any diuretic.  Euvolemic on exam.  Will continue to monitor    VTE prophylaxis:  SQ Heparin   Diet: N.p.o. Code Status:  Full Code Telemetry:  Admission status: Observation, Step Down Unit Patient is from: Home Anticipated d/c is to: Home Anticipated d/c is in: 1-2 days   Family Communication: Updated at bedside  Consults called: None   Severity of Illness: The appropriate patient status for this patient is OBSERVATION. Observation status is judged to be reasonable and necessary in order to provide the required intensity of service to ensure the patient's  safety. The patient's presenting symptoms, physical exam findings, and initial radiographic and laboratory data in the context of their medical condition is felt to place them at decreased risk for further clinical deterioration. Furthermore, it is anticipated that the patient will be medically stable for discharge from the hospital within 2 midnights of admission.    Morene Bathe, MD [place] DEL. [facility]     [1] No Known Allergies  "

## 2024-04-05 NOTE — ED Notes (Signed)
 Respiratory at bedside.

## 2024-04-05 NOTE — ED Notes (Signed)
 Pt c/o SOB that has worsened over the last few days. Pt reports he has COPD and wears 2L at baseline. Pt reports over the last few days his vision has become more blurry and his gait has become more unsteady. Pt denies pain at this time. Pt currently on 2L with SpO2 at 100%. Pt A&Ox4. Pt NAD at this time.

## 2024-04-05 NOTE — ED Notes (Signed)
 Patient refusing to wear bipap, patient given education on the importance of wearing bipap by myself. MD Cleatus made aware and also talked to the patient about wearing my bipap offering breaks every couple of hours from bipap. Patient still refused.

## 2024-04-05 NOTE — ED Triage Notes (Signed)
 Coming from home. C/o SOB, confusion per family, unsteady gait. Pt recently placed on 2L [place] baseline. Pt was seen 12/4 admitted for exacerbation and intubated. PMH: COPD. GCS 15

## 2024-04-05 NOTE — Progress Notes (Addendum)
 Pt's VBG was compensated at 7.38 with a C02 of 77, however, Dr wanted pt on bipap.  Pt was placed on bipap on the documented settings.  Pt was in no distress no SOB and xray shows a trace of bilateral pleural effusions..  Pt states he does wear 2L of 02 at home. Aerogen was placed in line for respiratory medication

## 2024-04-05 NOTE — ED Provider Notes (Addendum)
 "  [facility] Provider Note    Event Date/Time   First MD Initiated Contact with Patient 04/05/24 1201     (approximate)  History   Chief Complaint: Shortness of Breath  HPI  Stephen [name] is a 69 y.o. male with a past medical history of COPD who wears 2 L chronically, anxiety, CAD, presents to the emergency department for worsening shortness of breath and some confusion.  According to the patient for the last day or 2 he has had worsening shortness of breath and today has noted to be confused.  Patient states this has happened before when his CO2 levels go up.  Patient denies any increased cough over baseline no known fever.  No chest pain.  Physical Exam   Triage Vital Signs: ED Triage Vitals [04/05/24 1146]  Encounter Vitals Group     BP 126/83     Girls Systolic BP Percentile      Girls Diastolic BP Percentile      Boys Systolic BP Percentile      Boys Diastolic BP Percentile      Pulse Rate 90     Resp 18     Temp 98.9 F (37.2 C)     Temp Source Oral     SpO2 96 %     Weight      Height      Head Circumference      Peak Flow      Pain Score      Pain Loc      Pain Education      Exclude from Growth Chart     Most recent vital signs: Vitals:   04/05/24 1146  BP: 126/83  Pulse: 90  Resp: 18  Temp: 98.9 F (37.2 C)  SpO2: 96%    General:  Somewhat somnolent if not being actively engaged but does answer questions seemingly appropriately. CV:  Good peripheral perfusion.  Regular rate and rhythm  Resp:  Normal effort.  Equal breath sounds bilaterally.  Good air movement bilaterally. Abd:  No distention.  Soft, nontender.  No rebound or guarding. ED Results / Procedures / Treatments   EKG  EKG viewed and interpreted by myself shows a sinus rhythm at 84 bpm with a narrow QRS, normal axis, normal intervals, no concerning ST changes.  RADIOLOGY  I have reviewed interpret the chest x-ray images.  No consolidation on my  evaluation Radiology has read the x-ray as trace bilateral effusions  MEDICATIONS ORDERED IN ED: Medications - No data to display   IMPRESSION / MDM / ASSESSMENT AND PLAN / ED COURSE  I reviewed the triage vital signs and the nursing notes.  Patient's presentation is most consistent with acute presentation with potential threat to life or bodily function.  Patient presents to the emergency department for worsening shortness of breath and confusion over the last day or 2.  Here patient is somewhat somnolent but awakens to voice answers questions but will fall asleep if not actively engaged.  We will check labs including a VBG we will obtain a chest x-ray and continue to closely monitor.  Patient agreeable to plan of care.  Currently satting 100% on 2 L which is his baseline O2 requirement.  Patient's labs are resulted showing reassuring CBC largely reassuring chemistry however bicarb elevated to 36.  Troponin minimally elevated at 36.  Patient's respiratory panel is negative.  Patient's VBG does show a pCO2 elevated to 77 with a pH of 7.38 indicating largely  compensated.  However his last pCO2 was 52 although when he was admitted it was as high as 119.  I discussed placing the patient on BiPAP and admitting to the hospital.  Patient is adamant about going home.  Says that he has been fit for a home BiPAP that is supposed to be delivered next week.  He wants to try BiPAP for a couple hours and then be reassessed and if the patient is feeling better and he wants to go home I believe would be reasonable to try to go home.    Patient remains somnolent and confused at times.  We will keep the BiPAP in place we will admit to the hospitalist service for further workup and treatment.  FINAL CLINICAL IMPRESSION(S) / ED DIAGNOSES   Dyspnea Confusion Hypercapnia  Note:  This document was prepared using Dragon voice recognition software and may include unintentional dictation errors.   Dorothyann Drivers, MD 04/05/24 1420    Dorothyann Drivers, MD 04/05/24 1439  "

## 2024-04-05 NOTE — ED Notes (Signed)
 Called CCMD to add pt to monitoring.

## 2024-04-05 NOTE — ED Notes (Signed)
Called respiratory for bipap  

## 2024-04-06 DIAGNOSIS — J9622 Acute and chronic respiratory failure with hypercapnia: Secondary | ICD-10-CM | POA: Diagnosis not present

## 2024-04-06 DIAGNOSIS — J9621 Acute and chronic respiratory failure with hypoxia: Secondary | ICD-10-CM

## 2024-04-06 LAB — BASIC METABOLIC PANEL WITH GFR
Anion gap: 7 (ref 5–15)
BUN: 10 mg/dL (ref 8–23)
CO2: 34 mmol/L — ABNORMAL HIGH (ref 22–32)
Calcium: 9.4 mg/dL (ref 8.9–10.3)
Chloride: 96 mmol/L — ABNORMAL LOW (ref 98–111)
Creatinine, Ser: 0.53 mg/dL — ABNORMAL LOW (ref 0.61–1.24)
GFR, Estimated: >60 mL/min
Glucose, Bld: 133 mg/dL — ABNORMAL HIGH (ref 70–99)
Potassium: 4.9 mmol/L (ref 3.5–5.1)
Sodium: 137 mmol/L (ref 135–145)

## 2024-04-06 LAB — CBC
HCT: 40.9 % (ref 39.0–52.0)
Hemoglobin: 13.4 g/dL (ref 13.0–17.0)
MCH: 32.2 pg (ref 26.0–34.0)
MCHC: 32.8 g/dL (ref 30.0–36.0)
MCV: 98.3 fL (ref 80.0–100.0)
Platelets: 215 K/uL (ref 150–400)
RBC: 4.16 MIL/uL — ABNORMAL LOW (ref 4.22–5.81)
RDW: 11.9 % (ref 11.5–15.5)
WBC: 4 K/uL (ref 4.0–10.5)
nRBC: 0 % (ref 0.0–0.2)

## 2024-04-06 LAB — GLUCOSE, CAPILLARY
Glucose-Capillary: 98 mg/dL (ref 70–99)
Glucose-Capillary: 148 mg/dL — ABNORMAL HIGH (ref 70–99)
Glucose-Capillary: 169 mg/dL — ABNORMAL HIGH (ref 70–99)
Glucose-Capillary: 132 mg/dL — ABNORMAL HIGH (ref 70–99)

## 2024-04-06 MED ORDER — CLONAZEPAM 1 MG PO TABS
1.0000 mg | ORAL_TABLET | Freq: Two times a day (BID) | ORAL | Status: DC | PRN
  Administered 2024-04-06 (×2): 1 mg via ORAL
  Filled 2024-04-06 (×2): qty 1

## 2024-04-06 MED ORDER — TAMSULOSIN HCL 0.4 MG PO CAPS
0.4000 mg | ORAL_CAPSULE | Freq: Two times a day (BID) | ORAL | Status: DC
  Administered 2024-04-06 – 2024-04-07 (×2): 0.4 mg via ORAL
  Filled 2024-04-06 (×2): qty 1

## 2024-04-06 MED ORDER — BUDESON-GLYCOPYRROL-FORMOTEROL 160-9-4.8 MCG/ACT IN AERO
2.0000 | INHALATION_SPRAY | Freq: Two times a day (BID) | RESPIRATORY_TRACT | Status: DC
  Administered 2024-04-06 – 2024-04-07 (×2): 2 via RESPIRATORY_TRACT
  Filled 2024-04-06: qty 5.9

## 2024-04-06 MED ORDER — IPRATROPIUM-ALBUTEROL 0.5-2.5 (3) MG/3ML IN SOLN
3.0000 mL | Freq: Three times a day (TID) | RESPIRATORY_TRACT | Status: DC
  Administered 2024-04-07: 3 mL via RESPIRATORY_TRACT
  Filled 2024-04-06: qty 3

## 2024-04-06 NOTE — Progress Notes (Signed)
 Patient completely refused HHNC. MD aware. Patient now on 3L [place]. Saturation still above 90%. No distress noted.

## 2024-04-06 NOTE — TOC Initial Note (Signed)
 Transition of Care [facility]) - Initial/Assessment Note    Patient Details  Name: Stephen [name] MRN: [id number] Date of Birth: [birth year]  [facility]) CM/SW Contact:    Delphine KANDICE Bring, RN Phone Number: 04/06/2024, 12:40 PM  Clinical Narrative:                 At the time of assessment. Patient sitting up in bed with HFNC off. Staff came in and instructed patient to place HFNC back on. Patient place the oxygen on. Patient states that he use oxygen at home at 2 L. He states that his PCP is Dr. Cleotilde. His son will transport patient home when medically ready  Expected Discharge Plan: Home w Home Health Services     Patient Goals and CMS Choice            Expected Discharge Plan and Services                                              Prior Living Arrangements/Services   Lives with:: Self Patient language and need for interpreter reviewed:: No (English) Do you feel safe going back to the place where you live?: Yes          Current home services: DME    Activities of Daily Living   ADL Screening (condition at time of admission) Independently performs ADLs?: Yes (appropriate for developmental age) Is the patient deaf or have difficulty hearing?: No Does the patient have difficulty seeing, even when wearing glasses/contacts?: No Does the patient have difficulty concentrating, remembering, or making decisions?: No  Permission Sought/Granted                  Emotional Assessment Appearance:: Appears stated age Attitude/Demeanor/Rapport: Other (comment) (irratated) Affect (typically observed): Irritable Orientation: : Oriented to Self, Oriented to Place, Oriented to  Time, Oriented to Situation      Admission diagnosis:  Hypercapnia [R06.89] SOB (shortness of breath) [R06.02] Acute respiratory failure with hypoxia and hypercapnia (HCC) [J96.01, J96.02] Acute on chronic respiratory failure with hypoxia and hypercapnia (HCC) [G03.78,  J96.22] Chronic obstructive pulmonary disease, unspecified COPD type (HCC) [J44.9] Patient Active Problem List   Diagnosis Date Noted   Acute respiratory failure with hypoxia and hypercarbia (HCC) 03/07/2024   COPD with acute exacerbation (HCC) 10/03/2023   Hyponatremia 10/03/2023   Acute on chronic respiratory failure with hypoxia and hypercapnia (HCC) 10/01/2023   DM type 2 with diabetic mixed hyperlipidemia (HCC) 04/12/2023   Intermittent left-sided chest pain 05/03/2022   B12 deficiency 08/25/2021   Hyperglycemia 08/25/2021   Lower urinary tract symptoms (LUTS) 08/25/2021   Coronary artery calcification of native artery 08/25/2021   Hematuria, microscopic 08/25/2021   Atypical pigmented lesion 08/25/2021   Senile purpura 11/17/2020   Hepatic cyst 10/01/2020   Smoker 08/18/2020   Thoracic aorta atherosclerosis 04/27/2020   Tobacco use disorder 05/18/2016   Other emphysema (HCC) 11/18/2015   PCP:  Cleotilde Oneil FALCON, MD Pharmacy:   [facility] - [place], [place] - [address] [place] [address] Phone: [phone] Fax: [phone]     Social Drivers of Health (SDOH) Social History: SDOH Screenings   Food Insecurity: No Food Insecurity (04/06/2024)  Housing: Low Risk (04/06/2024)  Recent Concern: Housing - High Risk (03/20/2024)   Received from [facility]  Transportation Needs:  No Transportation Needs (04/06/2024)  Utilities: Not At Risk (04/06/2024)  Alcohol Screen: Low Risk (08/18/2022)  Depression (PHQ2-9): Low Risk (12/14/2022)  Financial Resource Strain: Medium Risk (08/29/2023)   Received [facility]  Physical Activity: Inactive (08/18/2022)  Social Connections: Moderately Integrated (04/06/2024)  Stress: No Stress Concern Present (08/18/2022)  Tobacco Use: High Risk (03/20/2024)   Received from [facility]   SDOH Interventions:     Readmission Risk Interventions     No data to  display

## 2024-04-06 NOTE — Progress Notes (Addendum)
 "    Progress Note    Stephen [name]  FMW:[id number] DOB: 1955/09/16  DOA: 04/05/2024 PCP: Cleotilde Oneil FALCON, MD      Brief Narrative:    Medical records reviewed and are as summarized below:  Stephen [name] is a 69 y.o. male with medical history significant for COPD, chronic hypoxic respiratory failure on 2 L oxygen, recent hospitalization about a month prior to admission for COPD exacerbation and acute respiratory failure requiring intubation and mechanical ventilation (discharged on 03/10/2024), hypertension, hyperlipidemia, type II DM, chronic HFpEF, tobacco use disorder.  He presented to the hospital because of increasing cough productive of sputum, worsening shortness of breath and confusion.   Initial vitals in the ED were stable with oxygen saturation of 96% on 2 L oxygen via [place].  Chest x-ray IMPRESSION: 1. Bilateral trace pleural effusions. 2. Interval removal of the endotracheal and enteric tube.   He was admitted to the hospital for COPD exacerbation and acute hypoxic and hypercapnic respiratory failure.  He was initially placed on BiPAP and then transition to oxygen via heated humidified high flow nasal cannula.   Assessment/Plan:   Principal Problem:   Acute on chronic respiratory failure with hypoxia and hypercapnia (HCC) Active Problems:   Tobacco use disorder   Coronary artery calcification of native artery   COPD with acute exacerbation (HCC)   DM type 2 with diabetic mixed hyperlipidemia (HCC)     Body mass index is 24.28 kg/m.   COPD exacerbation: Continue prednisone  and bronchodilators.   Acute on chronic hypoxic and hypercapnic respiratory failure: Patient supposed to be using oxygen via heated humidified high flow nasal cannula but he has been removing it and not using it as issued.  Consider putting him on regular nasal cannula as tolerated.  Initial VBG on admission: pH 7.38, pCO2 77, pO2 41, bicarb 45.6, oxygen saturation 70.7 Repeat VBG: pH  7.42, pCO2 60, pO2 58, bicarb 38.9.  Oxygen saturation 92.2  Recent intubation and mechanical ventilation for respiratory failure in December 2025   Tobacco use disorder: Counseled to quit smoking cigarettes.  He said he will not go back to cigarettes when he goes  home.   Anxiety: Klonopin  as needed for anxiety.   Comorbidities include hypertension, hyperlipidemia, type II DM, chronic HFpEF   Patient requested discharge to home today.  However I explained that I did not think he was medically ready for discharge to home today.  I felt he was not at baseline and that given his high risk for decompensation, he needs to be monitored in the hospital for at least another day or 2.  We also discussed that if he insisted on going home then it would have to be AGAINST MEDICAL ADVICE.  He understands that leaving AGAINST MEDICAL ADVICE is not the best option as he is at high risk of complications including but not limited to worsening respiratory failure, cardiopulmonary arrest and death.    Diet Order             Diet Heart Fluid consistency: Thin  Diet effective now                                  Consultants: None  Procedures: None    Medications:    aspirin  EC  81 mg Oral Daily   azithromycin   250 mg Oral QODAY   budesonide  (PULMICORT ) nebulizer solution  0.5 mg Nebulization BID  heparin   5,000 Units Subcutaneous Q8H   ibuprofen   400 mg Oral Once   insulin  aspart  0-5 Units Subcutaneous QHS   insulin  aspart  0-9 Units Subcutaneous TID WC   ipratropium-albuterol   3 mL Nebulization Q6H   nicotine   14 mg Transdermal Daily   predniSONE   40 mg Oral Q breakfast   roflumilast   500 mcg Oral Daily   rosuvastatin   10 mg Oral Daily   sodium chloride  flush  3 mL Intravenous Q12H   tamsulosin   0.8 mg Oral Daily   traZODone   100 mg Oral QHS   Continuous Infusions:  lactated ringers  Stopped (04/06/24 0051)     Anti-infectives (From admission, onward)     Start     Dose/Rate Route Frequency Ordered Stop   04/06/24 1000  azithromycin  (ZITHROMAX ) tablet 250 mg        250 mg Oral Every other day 04/05/24 1750                Family Communication/Anticipated D/C date and plan/Code Status   DVT prophylaxis: heparin  injection 5,000 Units Start: 04/05/24 1530     Code Status: Full Code  Family Communication: None Disposition Plan: Plan to discharge home   Status is: Observation The patient will require care spanning > 2 midnights and should be moved to inpatient because: COPD exacerbation acute on chronic respiratory failure       Subjective:   Interval events noted.  He complains of shortness of breath but he feels better and wants to go home today.  Objective:    Vitals:   04/06/24 0402 04/06/24 0700 04/06/24 0837 04/06/24 0858  BP: 123/68  126/74   Pulse: 84     Resp: 18  (!) 23   Temp: 98.7 F (37.1 C)   97.9 F (36.6 C)  TempSrc:    Oral  SpO2: 97% 97% 97%   Weight:      Height:       No data found.   Intake/Output Summary (Last 24 hours) at 04/06/2024 1158 Last data filed at 04/06/2024 0500 Gross per 24 hour  Intake 608.75 ml  Output 1300 ml  Net -691.25 ml   Filed Weights   04/06/24 0134  Weight: 81.2 kg    Exam:  GEN: NAD SKIN: Warm and dry EYES: No pallor or icterus ENT: MMM CV: RRR PULM: Decreased air entry bilaterally.  No wheezing or rales heard ABD: soft, ND, NT, +BS CNS: AAO x 3, non focal EXT: No edema or tenderness        Data Reviewed:   I have personally reviewed following labs and imaging studies:  Labs: Labs show the following:   Basic Metabolic Panel: Recent Labs  Lab 04/05/24 1232 04/06/24 0517  NA 135 137  K 4.7 4.9  CL 94* 96*  CO2 36* 34*  GLUCOSE 96 133*  BUN 8 10  CREATININE 0.59* 0.53*  CALCIUM  9.4 9.4  MG 1.8  --    GFR Estimated Creatinine Clearance: 97 mL/min (A) (by C-G formula based on SCr of 0.53 mg/dL (L)). Liver Function Tests: Recent  Labs  Lab 04/05/24 1232  AST 17  ALT 9  ALKPHOS 66  BILITOT 0.6  PROT 6.4*  ALBUMIN 3.7   No results for input(s): LIPASE, AMYLASE in the last 168 hours. No results for input(s): AMMONIA in the last 168 hours. Coagulation profile No results for input(s): INR, PROTIME in the last 168 hours.  CBC: Recent Labs  Lab 04/05/24 1232  04/06/24 0517  WBC 6.1 4.0  HGB 13.9 13.4  HCT 42.3 40.9  MCV 98.4 98.3  PLT 194 215   Cardiac Enzymes: No results for input(s): CKTOTAL, CKMB, CKMBINDEX, TROPONINI in the last 168 hours. BNP (last 3 results) No results for input(s): PROBNP in the last 8760 hours. CBG: Recent Labs  Lab 04/05/24 2144 04/06/24 0858 04/06/24 1139  GLUCAP 143* 98 148*   D-Dimer: No results for input(s): DDIMER in the last 72 hours. Hgb A1c: No results for input(s): HGBA1C in the last 72 hours. Lipid Profile: No results for input(s): CHOL, HDL, LDLCALC, TRIG, CHOLHDL, LDLDIRECT in the last 72 hours. Thyroid  function studies: No results for input(s): TSH, T4TOTAL, T3FREE, THYROIDAB in the last 72 hours.  Invalid input(s): FREET3 Anemia work up: No results for input(s): VITAMINB12, FOLATE, FERRITIN, TIBC, IRON, RETICCTPCT in the last 72 hours. Sepsis Labs: Recent Labs  Lab 04/05/24 1232 04/06/24 0517  WBC 6.1 4.0    Microbiology Recent Results (from the past 240 hours)  Resp panel by RT-PCR (RSV, Flu A&B, Covid) Anterior Nasal Swab     Status: None   Collection Time: 04/05/24 12:32 PM   Specimen: Anterior Nasal Swab  Result Value Ref Range Status   SARS Coronavirus 2 by RT PCR NEGATIVE NEGATIVE Final    Comment: (NOTE) SARS-CoV-2 target nucleic acids are NOT DETECTED.  The SARS-CoV-2 RNA is generally detectable in upper respiratory specimens during the acute phase of infection. The lowest concentration of SARS-CoV-2 viral copies this assay can detect is 138 copies/mL. A negative result does  not preclude SARS-Cov-2 infection and should not be used as the sole basis for treatment or other patient management decisions. A negative result may occur with  improper specimen collection/handling, submission of specimen other than nasopharyngeal swab, presence of viral mutation(s) within the areas targeted by this assay, and inadequate number of viral copies(<138 copies/mL). A negative result must be combined with clinical observations, patient history, and epidemiological information. The expected result is Negative.  Fact Sheet for Patients:  bloggercourse.com  Fact Sheet for Healthcare Providers:  seriousbroker.it  This test is no t yet approved or cleared by the [place]  FDA and  has been authorized for detection and/or diagnosis of SARS-CoV-2 by FDA under an Emergency Use Authorization (EUA). This EUA will remain  in effect (meaning this test can be used) for the duration of the COVID-19 declaration under Section 564(b)(1) of the Act, 21 U.S.C.section 360bbb-3(b)(1), unless the authorization is terminated  or revoked sooner.       Influenza A by PCR NEGATIVE NEGATIVE Final   Influenza B by PCR NEGATIVE NEGATIVE Final    Comment: (NOTE) The Xpert Xpress SARS-CoV-2/FLU/RSV plus assay is intended as an aid in the diagnosis of influenza from Nasopharyngeal swab specimens and should not be used as a sole basis for treatment. Nasal washings and aspirates are unacceptable for Xpert Xpress SARS-CoV-2/FLU/RSV testing.  Fact Sheet for Patients: bloggercourse.com  Fact Sheet for Healthcare Providers: seriousbroker.it  This test is not yet approved or cleared by the [place]  FDA and has been authorized for detection and/or diagnosis of SARS-CoV-2 by FDA under an Emergency Use Authorization (EUA). This EUA will remain in effect (meaning this test can be used) for the  duration of the COVID-19 declaration under Section 564(b)(1) of the Act, 21 U.S.C. section 360bbb-3(b)(1), unless the authorization is terminated or revoked.     Resp Syncytial Virus by PCR NEGATIVE NEGATIVE Final    Comment: (NOTE) Fact  Sheet for Patients: bloggercourse.com  Fact Sheet for Healthcare Providers: seriousbroker.it  This test is not yet approved or cleared by the [place]  FDA and has been authorized for detection and/or diagnosis of SARS-CoV-2 by FDA under an Emergency Use Authorization (EUA). This EUA will remain in effect (meaning this test can be used) for the duration of the COVID-19 declaration under Section 564(b)(1) of the Act, 21 U.S.C. section 360bbb-3(b)(1), unless the authorization is terminated or revoked.  Performed at [facility], [address] [place], [place] [address]     Procedures and diagnostic studies:  DG Chest Portable 1 View Result Date: 04/05/2024 EXAM: 1 VIEW(S) XRAY OF THE CHEST 04/05/2024 12:28:21 PM COMPARISON: 03/08/2024 CLINICAL HISTORY: sob FINDINGS: LINES, TUBES AND DEVICES: Endotracheal tube and enteric tube removed. LUNGS AND PLEURA: No focal pulmonary opacity. Bilateral trace pleural effusions. No pneumothorax. HEART AND MEDIASTINUM: Atherosclerotic plaque noted. No acute abnormality of the cardiac and mediastinal silhouettes. BONES AND SOFT TISSUES: No acute osseous abnormality. IMPRESSION: 1. Bilateral trace pleural effusions. 2. Interval removal of the endotracheal and enteric tube. Electronically signed by: Morgane Naveau MD 04/05/2024 01:33 PM EST RP Workstation: [id number]               LOS: 0 days   Lodie Waheed  [facility]   Pager on [email or web address]. If 7PM-7AM, please contact night-coverage at [email or web address]     04/06/2024, 11:58 AM           "

## 2024-04-06 NOTE — Evaluation (Signed)
 Physical Therapy Evaluation Patient Details Name: Stephen [name] MRN: [id number] DOB: 1956/04/02 Today's Date: 04/06/2024  History of Present Illness  Patient is a 69 year old male with worsening shortness of breath and confusion. PMH: HTN, HLD, type 2 diabetes, CHF.  Clinical Impression  Patient is agreeable to PT evaluation. He reports he lives alone and has been using 2 L02 at baseline.  On arrival to the room, the patient asking to walk to the bathroom and declined the [facility]. Patient walked a short distance with rolling walker on 4 L02 with Sp02 95-96%. He is fatigued with activity with dyspnea with exertion. Encouraged rolling walker use for safety as patient reports feeling generalized leg weakness compared to his baseline. He agreed to the rolling walker in the room, but declined the need for rolling walker at home. PT will continue to follow to maximize independence and decrease caregiver burden. He is eager for discharge soon.       If plan is discharge home, recommend the following: Assist for transportation;Assistance with cooking/housework   Can travel by private vehicle        Equipment Recommendations Rolling walker (2 wheels) (patient declined the rolling walker)  Recommendations for Other Services       Functional Status Assessment Patient has had a recent decline in their functional status and demonstrates the ability to make significant improvements in function in a reasonable and predictable amount of time.     Precautions / Restrictions Precautions Precautions: Fall Restrictions Weight Bearing Restrictions Per Provider Order: No      Mobility  Bed Mobility Overal bed mobility: Modified Independent                  Transfers Overall transfer level: Modified independent                      Ambulation/Gait Ambulation/Gait assistance: Contact guard assist, Supervision Gait Distance (Feet): 20 Feet Assistive device: Rolling walker (2  wheels) Gait Pattern/deviations: Step-through pattern Gait velocity: decreased     General Gait Details: patient ambulated to the bathroom and back to bed. he reports feeling generalized leg weakness compared to baseline. he is relying on rolling walker for support but declined for home use  Stairs            Wheelchair Mobility     Tilt Bed    Modified Rankin (Stroke Patients Only)       Balance                                             Pertinent Vitals/Pain Pain Assessment Pain Assessment: No/denies pain    Home Living Family/patient expects to be discharged to:: Private residence Living Arrangements: Alone Available Help at Discharge: Family;Available PRN/intermittently   Home Access: Level entry       Home Layout: One level Home Equipment: None      Prior Function Prior Level of Function : Independent/Modified Independent             Mobility Comments: no assistive device, 2 L02 at baseline       Extremity/Trunk Assessment   Upper Extremity Assessment Upper Extremity Assessment: Overall WFL for tasks assessed    Lower Extremity Assessment Lower Extremity Assessment: Generalized weakness       Communication   Communication Communication: No apparent difficulties    Cognition Arousal:  Alert Behavior During Therapy: Anxious   PT - Cognitive impairments: Awareness                         Following commands: Intact       Cueing Cueing Techniques: Verbal cues     General Comments General comments (skin integrity, edema, etc.): [facility] oxygen on arrival to the room. he wants to walk to the bathroom and declined the bed side commode. patient placed on 4 L02 (per nurse instruction) for mobility and then back on [facility]    Exercises     Assessment/Plan    PT Assessment Patient needs continued PT services  PT Problem List Cardiopulmonary status limiting activity;Decreased strength;Decreased  mobility;Decreased balance;Decreased activity tolerance;Decreased cognition       PT Treatment Interventions DME instruction;Gait training;Functional mobility training;Stair training;Therapeutic activities;Therapeutic exercise;Balance training;Neuromuscular re-education;Cognitive remediation;Patient/family education    PT Goals (Current goals can be found in the Care Plan section)  Acute Rehab PT Goals Patient Stated Goal: home as soon as possible PT Goal Formulation: With patient Time For Goal Achievement: 04/20/24 Potential to Achieve Goals: Fair    Frequency Min 1X/week     Co-evaluation               AM-PAC PT 6 Clicks Mobility  Outcome Measure Help needed turning from your back to your side while in a flat bed without using bedrails?: None Help needed moving from lying on your back to sitting on the side of a flat bed without using bedrails?: A Little Help needed moving to and from a bed to a chair (including a wheelchair)?: A Little Help needed standing up from a chair using your arms (e.g., wheelchair or bedside chair)?: A Little Help needed to walk in hospital room?: A Little Help needed climbing 3-5 steps with a railing? : A Little 6 Click Score: 19    End of Session Equipment Utilized During Treatment: Oxygen Activity Tolerance: Patient limited by fatigue Patient left: in bed;with call bell/phone within reach;with bed alarm set Nurse Communication: Mobility status PT Visit Diagnosis: Muscle weakness (generalized) (M62.81);Unsteadiness on feet (R26.81)    Time: 9042-8979 PT Time Calculation (min) (ACUTE ONLY): 23 min   Charges:   PT Evaluation $PT Eval Moderate Complexity: 1 Mod   PT General Charges $$ ACUTE PT VISIT: 1 Visit         Randine Essex, PT, MPT   Randine LULLA Essex 04/06/2024, 1:21 PM

## 2024-04-06 NOTE — Progress Notes (Signed)
 Patient now wearing heated high flow but removes it from time to time. Still refuses continuous pulse ox monitoring. Patient is not agitated, just doesn't think he needs it. No distress noted at this time.

## 2024-04-06 NOTE — Progress Notes (Signed)
 Patient refusing to wear bi-pap and Heated High Flow [place].  Patient currently on 4L standard nasal canula. Patient also refusing continuous pulse ox monitoring. No signs of distress at this time.

## 2024-04-06 NOTE — Care Management Obs Status (Signed)
 MEDICARE OBSERVATION STATUS NOTIFICATION   Patient Details  Name: Stephen [name] MRN: [id number] Date of Birth: 1956-04-02   Medicare Observation Status Notification Given:  Yes    Delphine KANDICE Bring, RN 04/06/2024, 11:12 AM

## 2024-04-07 DIAGNOSIS — J9622 Acute and chronic respiratory failure with hypercapnia: Secondary | ICD-10-CM | POA: Diagnosis not present

## 2024-04-07 DIAGNOSIS — J9621 Acute and chronic respiratory failure with hypoxia: Secondary | ICD-10-CM | POA: Diagnosis not present

## 2024-04-07 LAB — GLUCOSE, CAPILLARY: Glucose-Capillary: 89 mg/dL (ref 70–99)

## 2024-04-07 NOTE — Progress Notes (Signed)
 Patient has gotten increasingly confused as the shift continues. He is incontinent. Urinating in the bed. Does not use  the call bell for assistance. Getting out of bed to change the bedding himself.Removing his 3L  O2 multiple times during the shift. He will not follow safety directions with multiple prompts and verbal interventions.

## 2024-04-07 NOTE — Progress Notes (Signed)
 This pt is refusing to have a blood gas done. MD Ayiku at bedside and educated him on the importance of having it done prior to deciding on whether or not he is safe for discharge. Per pt I don't want any more procedures done today. This nurse educated him on the importance of having it done to see what other care he needs from the staff before making a decision to go home and per pt continued refusing.

## 2024-04-07 NOTE — Discharge Summary (Signed)
 Patient said he was feeling better and wanted to go home today.  I explained that it would be prudent to get an ABG because of report of recent confusion/agitation overnight.  However, he insisted on going home without ABG or VBG.  I explained that he is at high risk for decompensation because of advanced COPD and respiratory failure with history of intubation and mechanical ventilation in the past.  Patient was adamant about going home without any additional testing and requested to leave AGAINST MEDICAL ADVICE.SABRA  He understands that he is at increased risk for complications including worsening respiratory failure, cardiopulmonary arrest and death.  This discussion was witnessed by Lauralyn, RN, and Riane, respiratory therapist, at the bedside.   Patient left AGAINST MEDICAL ADVICE despite multiple attempts to get him to stay for management and appropriate discharge.

## 2024-04-07 NOTE — Progress Notes (Signed)
 Pt has signed AMA form, tele/IV off.

## 2024-04-07 NOTE — Progress Notes (Signed)
 This pt is insisting on going home, and states that if he doesn't go home today that he will walk out. He states I have an appt Tuesday for my bipap and will pick that up to use that at home, son is at bedside and disagrees with the pt wanting to go home.   MD Jens made aware of pt wanting to go home and leaving AMA.

## 2024-04-07 NOTE — Progress Notes (Addendum)
 "    Progress Note    Stephen [name]  FMW:[id number] DOB: [birth year]  DOA: 04/05/2024 PCP: Cleotilde Oneil FALCON, MD      Brief Narrative:    Medical records reviewed and are as summarized below:  Stephen [name] is a 69 y.o. male with medical history significant for COPD, chronic hypoxic respiratory failure on 2 L oxygen, recent hospitalization about a month prior to admission for COPD exacerbation and acute respiratory failure requiring intubation and mechanical ventilation (discharged on 03/10/2024), hypertension, hyperlipidemia, type II DM, chronic HFpEF, tobacco use disorder.  He presented to the hospital because of increasing cough productive of sputum, worsening shortness of breath and confusion.   Initial vitals in the ED were stable with oxygen saturation of 96% on 2 L oxygen via [place].  Chest x-ray IMPRESSION: 1. Bilateral trace pleural effusions. 2. Interval removal of the endotracheal and enteric tube.   He was admitted to the hospital for COPD exacerbation and acute hypoxic and hypercapnic respiratory failure.  He was initially placed on BiPAP and then transition to oxygen via heated humidified high flow nasal cannula.   Assessment/Plan:   Principal Problem:   Acute on chronic respiratory failure with hypoxia and hypercapnia (HCC) Active Problems:   Tobacco use disorder   Coronary artery calcification of native artery   COPD with acute exacerbation (HCC)   DM type 2 with diabetic mixed hyperlipidemia (HCC)     Body mass index is 24.28 kg/m.   COPD exacerbation: Recommended ABG or VBG because of reports of confusion overnight.  However, he refused to have ABG or VBG done.  He said his mental status is back to baseline and he does not needed.  Continue prednisone  and bronchodilators.   Acute on chronic hypoxic and hypercapnic respiratory failure: Improved.  He is tolerating 3 L oxygen via [place].   Previously required BiPAP followed by oxygen via heated high 25 high flow  nasal cannula.  Initial VBG on admission: pH 7.38, pCO2 77, pO2 41, bicarb 45.6, oxygen saturation 70.7 Repeat VBG: pH 7.42, pCO2 60, pO2 58, bicarb 38.9.  Oxygen saturation 92.2  Recent intubation and mechanical ventilation for respiratory failure in December 2025   Tobacco use disorder: Counseled to quit smoking cigarettes.  He said he will not go back to cigarettes when he goes  home.   Anxiety: Klonopin  as needed for anxiety.   Comorbidities include hypertension, hyperlipidemia, type II DM, chronic HFpEF   Patient said he was feeling better and wanted to go home today.  I explained that it would be prudent to get an ABG because of report of recent confusion/agitation overnight.  However, he insisted on going home without ABG or VBG.  I explained that he is at high risk for decompensation because of advanced COPD and respiratory failure with history of intubation and mechanical ventilation in the past.  Patient was adamant about going home without any additional testing and requested to leave AGAINST MEDICAL ADVICE.SABRA  He understands that he is at increased risk for complications including worsening respiratory failure, cardiopulmonary arrest and death.  This discussion was witnessed by Lauralyn, RN, and Riane, respiratory therapist, at the bedside.   Diet Order             Diet Heart Fluid consistency: Thin  Diet effective now  Consultants: None  Procedures: None    Medications:    aspirin  EC  81 mg Oral Daily   azithromycin   250 mg Oral QODAY   budesonide -glycopyrrolate -formoterol   2 puff Inhalation BID   heparin   5,000 Units Subcutaneous Q8H   ibuprofen   400 mg Oral Once   insulin  aspart  0-5 Units Subcutaneous QHS   insulin  aspart  0-9 Units Subcutaneous TID WC   ipratropium-albuterol   3 mL Nebulization TID   nicotine   14 mg Transdermal Daily   predniSONE   40 mg Oral Q breakfast   roflumilast   500 mcg Oral Daily    rosuvastatin   10 mg Oral Daily   sodium chloride  flush  3 mL Intravenous Q12H   tamsulosin   0.4 mg Oral BID   traZODone   100 mg Oral QHS   Continuous Infusions:     Anti-infectives (From admission, onward)    Start     Dose/Rate Route Frequency Ordered Stop   04/06/24 1000  azithromycin  (ZITHROMAX ) tablet 250 mg        250 mg Oral Every other day 04/05/24 1750                Family Communication/Anticipated D/C date and plan/Code Status   DVT prophylaxis: heparin  injection 5,000 Units Start: 04/05/24 1530     Code Status: Full Code  Family Communication: None Disposition Plan: Plan to discharge home   Status is: Observation The patient will require care spanning > 2 midnights and should be moved to inpatient because: COPD exacerbation acute on chronic respiratory failure       Subjective:   Interval events noted.  He said he is feeling better and back to baseline.  However, nursing staff reported that he was confused and agitated overnight.  Objective:    Vitals:   04/07/24 0545 04/07/24 0600 04/07/24 0615 04/07/24 0815  BP:    (!) 138/91  Pulse:    91  Resp: (!) 25 (!) 26 (!) 29 (!) 32  Temp:    (!) 97.5 F (36.4 C)  TempSrc:    Oral  SpO2:    100%  Weight:      Height:       No data found.   Intake/Output Summary (Last 24 hours) at 04/07/2024 0842 Last data filed at 04/07/2024 0400 Gross per 24 hour  Intake 720 ml  Output 1450 ml  Net -730 ml   Filed Weights   04/06/24 0134  Weight: 81.2 kg    Exam:  GEN: NAD SKIN: Warm and dry EYES: Decreased air entry bilaterally ENT: MMM CV: RRR PULM: CTA B ABD: soft, ND, NT, +BS CNS: AAO x 4, non focal EXT: No edema or tenderness       Data Reviewed:   I have personally reviewed following labs and imaging studies:  Labs: Labs show the following:   Basic Metabolic Panel: Recent Labs  Lab 04/05/24 1232 04/06/24 0517  NA 135 137  K 4.7 4.9  CL 94* 96*  CO2 36* 34*  GLUCOSE  96 133*  BUN 8 10  CREATININE 0.59* 0.53*  CALCIUM  9.4 9.4  MG 1.8  --    GFR Estimated Creatinine Clearance: 97 mL/min (A) (by C-G formula based on SCr of 0.53 mg/dL (L)). Liver Function Tests: Recent Labs  Lab 04/05/24 1232  AST 17  ALT 9  ALKPHOS 66  BILITOT 0.6  PROT 6.4*  ALBUMIN 3.7   No results for input(s): LIPASE, AMYLASE in the last 168 hours. No  results for input(s): AMMONIA in the last 168 hours. Coagulation profile No results for input(s): INR, PROTIME in the last 168 hours.  CBC: Recent Labs  Lab 04/05/24 1232 04/06/24 0517  WBC 6.1 4.0  HGB 13.9 13.4  HCT 42.3 40.9  MCV 98.4 98.3  PLT 194 215   Cardiac Enzymes: No results for input(s): CKTOTAL, CKMB, CKMBINDEX, TROPONINI in the last 168 hours. BNP (last 3 results) No results for input(s): PROBNP in the last 8760 hours. CBG: Recent Labs  Lab 04/06/24 0858 04/06/24 1139 04/06/24 1722 04/06/24 2259 04/07/24 0812  GLUCAP 98 148* 169* 132* 89   D-Dimer: No results for input(s): DDIMER in the last 72 hours. Hgb A1c: No results for input(s): HGBA1C in the last 72 hours. Lipid Profile: No results for input(s): CHOL, HDL, LDLCALC, TRIG, CHOLHDL, LDLDIRECT in the last 72 hours. Thyroid  function studies: No results for input(s): TSH, T4TOTAL, T3FREE, THYROIDAB in the last 72 hours.  Invalid input(s): FREET3 Anemia work up: No results for input(s): VITAMINB12, FOLATE, FERRITIN, TIBC, IRON, RETICCTPCT in the last 72 hours. Sepsis Labs: Recent Labs  Lab 04/05/24 1232 04/06/24 0517  WBC 6.1 4.0    Microbiology Recent Results (from the past 240 hours)  Resp panel by RT-PCR (RSV, Flu A&B, Covid) Anterior Nasal Swab     Status: None   Collection Time: 04/05/24 12:32 PM   Specimen: Anterior Nasal Swab  Result Value Ref Range Status   SARS Coronavirus 2 by RT PCR NEGATIVE NEGATIVE Final    Comment: (NOTE) SARS-CoV-2 target nucleic acids  are NOT DETECTED.  The SARS-CoV-2 RNA is generally detectable in upper respiratory specimens during the acute phase of infection. The lowest concentration of SARS-CoV-2 viral copies this assay can detect is 138 copies/mL. A negative result does not preclude SARS-Cov-2 infection and should not be used as the sole basis for treatment or other patient management decisions. A negative result may occur with  improper specimen collection/handling, submission of specimen other than nasopharyngeal swab, presence of viral mutation(s) within the areas targeted by this assay, and inadequate number of viral copies(<138 copies/mL). A negative result must be combined with clinical observations, patient history, and epidemiological information. The expected result is Negative.  Fact Sheet for Patients:  bloggercourse.com  Fact Sheet for Healthcare Providers:  seriousbroker.it  This test is no t yet approved or cleared by the [place]  FDA and  has been authorized for detection and/or diagnosis of SARS-CoV-2 by FDA under an Emergency Use Authorization (EUA). This EUA will remain  in effect (meaning this test can be used) for the duration of the COVID-19 declaration under Section 564(b)(1) of the Act, 21 U.S.C.section 360bbb-3(b)(1), unless the authorization is terminated  or revoked sooner.       Influenza A by PCR NEGATIVE NEGATIVE Final   Influenza B by PCR NEGATIVE NEGATIVE Final    Comment: (NOTE) The Xpert Xpress SARS-CoV-2/FLU/RSV plus assay is intended as an aid in the diagnosis of influenza from Nasopharyngeal swab specimens and should not be used as a sole basis for treatment. Nasal washings and aspirates are unacceptable for Xpert Xpress SARS-CoV-2/FLU/RSV testing.  Fact Sheet for Patients: bloggercourse.com  Fact Sheet for Healthcare Providers: seriousbroker.it  This test is  not yet approved or cleared by the [place]  FDA and has been authorized for detection and/or diagnosis of SARS-CoV-2 by FDA under an Emergency Use Authorization (EUA). This EUA will remain in effect (meaning this test can be used) for the duration of the COVID-19  declaration under Section 564(b)(1) of the Act, 21 U.S.C. section 360bbb-3(b)(1), unless the authorization is terminated or revoked.     Resp Syncytial Virus by PCR NEGATIVE NEGATIVE Final    Comment: (NOTE) Fact Sheet for Patients: bloggercourse.com  Fact Sheet for Healthcare Providers: seriousbroker.it  This test is not yet approved or cleared by the [place]  FDA and has been authorized for detection and/or diagnosis of SARS-CoV-2 by FDA under an Emergency Use Authorization (EUA). This EUA will remain in effect (meaning this test can be used) for the duration of the COVID-19 declaration under Section 564(b)(1) of the Act, 21 U.S.C. section 360bbb-3(b)(1), unless the authorization is terminated or revoked.  Performed at [facility], [address] [place], [place] [address]     Procedures and diagnostic studies:  DG Chest Portable 1 View Result Date: 04/05/2024 EXAM: 1 VIEW(S) XRAY OF THE CHEST 04/05/2024 12:28:21 PM COMPARISON: 03/08/2024 CLINICAL HISTORY: sob FINDINGS: LINES, TUBES AND DEVICES: Endotracheal tube and enteric tube removed. LUNGS AND PLEURA: No focal pulmonary opacity. Bilateral trace pleural effusions. No pneumothorax. HEART AND MEDIASTINUM: Atherosclerotic plaque noted. No acute abnormality of the cardiac and mediastinal silhouettes. BONES AND SOFT TISSUES: No acute osseous abnormality. IMPRESSION: 1. Bilateral trace pleural effusions. 2. Interval removal of the endotracheal and enteric tube. Electronically signed by: Morgane Naveau MD 04/05/2024 01:33 PM EST RP Workstation: [id number]               LOS: 0 days   Johnathon Mittal  [facility]   Pager on [email or web address]. If 7PM-7AM, please contact night-coverage at [email or web address]     04/07/2024, 8:42 AM           "

## 2024-04-07 NOTE — Plan of Care (Signed)

## 2024-04-08 ENCOUNTER — Inpatient Hospital Stay
Admission: EM | Admit: 2024-04-08 | Discharge: 2024-04-09 | DRG: 189 | Disposition: A | Discharge status: Home / Self Care | Attending: Obstetrics and Gynecology | Admitting: Obstetrics and Gynecology

## 2024-04-08 DIAGNOSIS — Z8249 Family history of ischemic heart disease and other diseases of the circulatory system: Secondary | ICD-10-CM

## 2024-04-08 DIAGNOSIS — J9622 Acute and chronic respiratory failure with hypercapnia: Secondary | ICD-10-CM | POA: Diagnosis present

## 2024-04-08 DIAGNOSIS — F17210 Nicotine dependence, cigarettes, uncomplicated: Secondary | ICD-10-CM | POA: Diagnosis present

## 2024-04-08 DIAGNOSIS — E1169 Type 2 diabetes mellitus with other specified complication: Secondary | ICD-10-CM | POA: Diagnosis present

## 2024-04-08 DIAGNOSIS — J441 Chronic obstructive pulmonary disease with (acute) exacerbation: Secondary | ICD-10-CM | POA: Diagnosis present

## 2024-04-08 DIAGNOSIS — Z716 Tobacco abuse counseling: Secondary | ICD-10-CM

## 2024-04-08 DIAGNOSIS — I2510 Atherosclerotic heart disease of native coronary artery without angina pectoris: Secondary | ICD-10-CM | POA: Diagnosis present

## 2024-04-08 DIAGNOSIS — Z5329 Procedure and treatment not carried out because of patient's decision for other reasons: Secondary | ICD-10-CM | POA: Diagnosis not present

## 2024-04-08 DIAGNOSIS — E782 Mixed hyperlipidemia: Secondary | ICD-10-CM | POA: Diagnosis present

## 2024-04-08 DIAGNOSIS — Z7951 Long term (current) use of inhaled steroids: Secondary | ICD-10-CM

## 2024-04-08 DIAGNOSIS — Z79899 Other long term (current) drug therapy: Secondary | ICD-10-CM | POA: Diagnosis not present

## 2024-04-08 DIAGNOSIS — J449 Chronic obstructive pulmonary disease, unspecified: Secondary | ICD-10-CM | POA: Diagnosis present

## 2024-04-08 DIAGNOSIS — E876 Hypokalemia: Secondary | ICD-10-CM | POA: Diagnosis present

## 2024-04-08 DIAGNOSIS — Z7982 Long term (current) use of aspirin: Secondary | ICD-10-CM

## 2024-04-08 DIAGNOSIS — F419 Anxiety disorder, unspecified: Secondary | ICD-10-CM | POA: Diagnosis present

## 2024-04-08 DIAGNOSIS — J9612 Chronic respiratory failure with hypercapnia: Principal | ICD-10-CM

## 2024-04-08 DIAGNOSIS — I700 Atherosclerosis of aorta: Secondary | ICD-10-CM | POA: Diagnosis present

## 2024-04-08 DIAGNOSIS — Z66 Do not resuscitate: Secondary | ICD-10-CM | POA: Diagnosis present

## 2024-04-08 DIAGNOSIS — N400 Enlarged prostate without lower urinary tract symptoms: Secondary | ICD-10-CM | POA: Diagnosis present

## 2024-04-08 DIAGNOSIS — D72829 Elevated white blood cell count, unspecified: Secondary | ICD-10-CM | POA: Diagnosis present

## 2024-04-08 DIAGNOSIS — I110 Hypertensive heart disease with heart failure: Secondary | ICD-10-CM | POA: Diagnosis present

## 2024-04-08 DIAGNOSIS — I5032 Chronic diastolic (congestive) heart failure: Secondary | ICD-10-CM | POA: Diagnosis present

## 2024-04-08 DIAGNOSIS — J9621 Acute and chronic respiratory failure with hypoxia: Secondary | ICD-10-CM | POA: Diagnosis present

## 2024-04-08 DIAGNOSIS — F17200 Nicotine dependence, unspecified, uncomplicated: Secondary | ICD-10-CM | POA: Diagnosis present

## 2024-04-08 DIAGNOSIS — R0602 Shortness of breath: Secondary | ICD-10-CM | POA: Diagnosis present

## 2024-04-08 DIAGNOSIS — E785 Hyperlipidemia, unspecified: Secondary | ICD-10-CM | POA: Insufficient documentation

## 2024-04-08 LAB — BLOOD GAS, VENOUS
Acid-Base Excess: 10.9 mmol/L — ABNORMAL HIGH (ref 0.0–2.0)
Acid-Base Excess: 12.3 mmol/L — ABNORMAL HIGH (ref 0.0–2.0)
Bicarbonate: 40.8 mmol/L — ABNORMAL HIGH (ref 20.0–28.0)
Bicarbonate: 41.4 mmol/L — ABNORMAL HIGH (ref 20.0–28.0)
Delivery systems: POSITIVE
O2 Saturation: 83.7 %
O2 Saturation: 81.9 %
Patient temperature: 37
Patient temperature: 37
pCO2, Ven: 81 mmHg (ref 44–60)
pCO2, Ven: 75 mmHg (ref 44–60)
pH, Ven: 7.31 (ref 7.25–7.43)
pH, Ven: 7.35 (ref 7.25–7.43)
pO2, Ven: 52 mmHg — ABNORMAL HIGH (ref 32–45)
pO2, Ven: 45 mmHg (ref 32–45)

## 2024-04-08 LAB — COMPREHENSIVE METABOLIC PANEL WITH GFR
ALT: 11 U/L (ref 0–44)
AST: 11 U/L — ABNORMAL LOW (ref 15–41)
Albumin: 3.7 g/dL (ref 3.5–5.0)
Alkaline Phosphatase: 61 U/L (ref 38–126)
Anion gap: 6 (ref 5–15)
BUN: 12 mg/dL (ref 8–23)
CO2: 36 mmol/L — ABNORMAL HIGH (ref 22–32)
Calcium: 9.2 mg/dL (ref 8.9–10.3)
Chloride: 97 mmol/L — ABNORMAL LOW (ref 98–111)
Creatinine, Ser: 0.57 mg/dL — ABNORMAL LOW (ref 0.61–1.24)
GFR, Estimated: >60 mL/min
Glucose, Bld: 97 mg/dL (ref 70–99)
Potassium: 4.1 mmol/L (ref 3.5–5.1)
Sodium: 139 mmol/L (ref 135–145)
Total Bilirubin: 0.4 mg/dL (ref 0.0–1.2)
Total Protein: 6.2 g/dL — ABNORMAL LOW (ref 6.5–8.1)

## 2024-04-08 LAB — TROPONIN T, HIGH SENSITIVITY: Troponin T High Sensitivity: 33 ng/L — ABNORMAL HIGH (ref 0–19)

## 2024-04-08 LAB — CBC
HCT: 42.7 % (ref 39.0–52.0)
Hemoglobin: 13.8 g/dL (ref 13.0–17.0)
MCH: 32.3 pg (ref 26.0–34.0)
MCHC: 32.3 g/dL (ref 30.0–36.0)
MCV: 100 fL (ref 80.0–100.0)
Platelets: 194 K/uL (ref 150–400)
RBC: 4.27 MIL/uL (ref 4.22–5.81)
RDW: 12.6 % (ref 11.5–15.5)
WBC: 6.1 K/uL (ref 4.0–10.5)
nRBC: 0 % (ref 0.0–0.2)

## 2024-04-08 LAB — MAGNESIUM: Magnesium: 1.9 mg/dL (ref 1.7–2.4)

## 2024-04-08 MED ORDER — HEPARIN SODIUM (PORCINE) 5000 UNIT/ML IJ SOLN
5000.0000 [IU] | Freq: Three times a day (TID) | INTRAMUSCULAR | Status: DC
  Administered 2024-04-08 – 2024-04-09 (×3): 5000 [IU] via SUBCUTANEOUS
  Filled 2024-04-08 (×3): qty 1

## 2024-04-08 MED ORDER — METHYLPREDNISOLONE SODIUM SUCC 125 MG IJ SOLR: 125.0000 mg | Freq: Once | INTRAMUSCULAR | Status: DC

## 2024-04-08 MED ORDER — ACETAMINOPHEN 325 MG PO TABS: 650.0000 mg | ORAL_TABLET | Freq: Four times a day (QID) | ORAL | Status: DC | PRN

## 2024-04-08 MED ORDER — CLONAZEPAM 1 MG PO TABS
1.0000 mg | ORAL_TABLET | Freq: Two times a day (BID) | ORAL | Status: DC | PRN
  Administered 2024-04-08 – 2024-04-09 (×2): 1 mg via ORAL
  Filled 2024-04-08 (×2): qty 2

## 2024-04-08 MED ORDER — SODIUM CHLORIDE 0.9% FLUSH
3.0000 mL | Freq: Two times a day (BID) | INTRAVENOUS | Status: DC
  Administered 2024-04-08 – 2024-04-09 (×3): 3 mL via INTRAVENOUS

## 2024-04-08 MED ORDER — IPRATROPIUM-ALBUTEROL 0.5-2.5 (3) MG/3ML IN SOLN
3.0000 mL | Freq: Once | RESPIRATORY_TRACT | Status: AC
  Administered 2024-04-08: 3 mL via RESPIRATORY_TRACT
  Filled 2024-04-08: qty 3

## 2024-04-08 MED ORDER — PREDNISONE 20 MG PO TABS
40.0000 mg | ORAL_TABLET | Freq: Once | ORAL | Status: AC
  Administered 2024-04-08: 40 mg via ORAL
  Filled 2024-04-08: qty 2

## 2024-04-08 MED ORDER — BUDESONIDE 0.5 MG/2ML IN SUSP
0.5000 mg | Freq: Two times a day (BID) | RESPIRATORY_TRACT | Status: DC
  Administered 2024-04-08 – 2024-04-09 (×2): 0.5 mg via RESPIRATORY_TRACT
  Filled 2024-04-08 (×2): qty 2

## 2024-04-08 MED ORDER — PREDNISONE 20 MG PO TABS
40.0000 mg | ORAL_TABLET | Freq: Every day | ORAL | Status: AC
  Administered 2024-04-09: 40 mg via ORAL
  Filled 2024-04-08: qty 2

## 2024-04-08 MED ORDER — LORAZEPAM 2 MG/ML IJ SOLN: 0.2500 mg | INTRAMUSCULAR | Status: DC | PRN

## 2024-04-08 MED ORDER — ONDANSETRON HCL 4 MG/2ML IJ SOLN: 4.0000 mg | Freq: Four times a day (QID) | INTRAMUSCULAR | Status: DC | PRN

## 2024-04-08 MED ORDER — ACETAMINOPHEN 650 MG RE SUPP: 650.0000 mg | Freq: Four times a day (QID) | RECTAL | Status: DC | PRN

## 2024-04-08 MED ORDER — SENNOSIDES-DOCUSATE SODIUM 8.6-50 MG PO TABS: 1.0000 | ORAL_TABLET | Freq: Every evening | ORAL | Status: DC | PRN

## 2024-04-08 MED ORDER — ONDANSETRON HCL 4 MG PO TABS: 4.0000 mg | ORAL_TABLET | Freq: Four times a day (QID) | ORAL | Status: DC | PRN

## 2024-04-08 NOTE — ED Notes (Signed)
 Pt wanted this RN to notify provider that he wanted to take off the bipap. Provider said yes and ordered repeat VBG. Pt currently off Bipap and placed on 3L via .

## 2024-04-08 NOTE — TOC Initial Note (Signed)
 Transition of Care [facility]) - Initial/Assessment Note    Patient Details  Name: Stephen [name] MRN: [id number] Date of Birth: Aug 25, 1955  Transition of Care [facility]) CM/SW Contact:    Nathanael CHRISTELLA Ring, RN Phone Number: 04/08/2024, 4:10 PM  Clinical Narrative:                 CM met with patient at the bedside in the ED, CM consult for Bipap.  Introduced self and explained role in DC planning.  He is from home, he left AMA yesterday because he got so frustrated with everything that was going on, then he came back today because he felt like he was having a stroke.  He is on the Bipap in the ED able to answer my questions appropriately.  He is irritable and wants some water, CM did message the nurse.   He reports having oxygen through [employer], his Pulmonologist is Dr. Aleskerov.  Reached out and talked to [facility] with [employer], he confirms oxygen and he says that Dr. Aleskerov has already completed the orders for the Bipap and patient was scheduled to get set up at their office at [facility] but patient had to reschedule, he now has an appointment on Wed at 1030.  Mitch said that it would be possible for him to discharge Wed morning and then go to the appointment at [facility] supply where the RT will be able to give him the machine and do all the teaching, provided this information to the hospital provider.  Patient is okay with this plan as long as medically stable.   Expected Discharge Plan: Home w Home Health Services Barriers to Discharge: Continued Medical Work up   Patient Goals and CMS Choice Patient states their goals for this hospitalization and ongoing recovery are:: Wants some water, frustrated with everything going on          Expected Discharge Plan and Services   Discharge Planning Services: CM Consult   Living arrangements for the past 2 months: Single Family Home                                      Prior Living Arrangements/Services Living arrangements for  the past 2 months: Single Family Home Lives with:: Self Patient language and need for interpreter reviewed:: Yes Do you feel safe going back to the place where you live?: Yes      Need for Family Participation in Patient Care: Yes (Comment) Care giver support system in place?: Yes (comment) Current home services: DME (oxygen) Criminal Activity/Legal Involvement Pertinent to Current Situation/Hospitalization: No - Comment as needed  Activities of Daily Living      Permission Sought/Granted Permission sought to share information with : Other (comment) Permission granted to share information with : Yes, Verbal Permission Granted     Permission granted to share info w AGENCY: [employer]        Emotional Assessment Appearance:: Appears stated age Attitude/Demeanor/Rapport: Complaining, Engaged Affect (typically observed): Irritable Orientation: : Oriented to Self, Oriented to Place, Oriented to  Time, Oriented to Situation Alcohol / Substance Use: Not Applicable Psych Involvement: No (comment)  Admission diagnosis:  Acute on chronic respiratory failure with hypercapnia (HCC) [J96.22] Patient Active Problem List   Diagnosis Date Noted   Acute on chronic respiratory failure with hypercapnia (HCC) 04/08/2024   Hyperlipidemia 04/08/2024   Acute respiratory failure with hypoxia and hypercarbia (HCC) 03/07/2024  COPD (chronic obstructive pulmonary disease) (HCC) 10/03/2023   Hyponatremia 10/03/2023   Acute on chronic respiratory failure with hypoxia and hypercapnia (HCC) 10/01/2023   DM type 2 with diabetic mixed hyperlipidemia (HCC) 04/12/2023   Intermittent left-sided chest pain 05/03/2022   B12 deficiency 08/25/2021   Hyperglycemia 08/25/2021   Lower urinary tract symptoms (LUTS) 08/25/2021   Coronary artery calcification of native artery 08/25/2021   Hematuria, microscopic 08/25/2021   Atypical pigmented lesion 08/25/2021   Senile purpura 11/17/2020   Hepatic cyst 10/01/2020    Smoker 08/18/2020   Thoracic aorta atherosclerosis 04/27/2020   Tobacco use disorder 05/18/2016   Other emphysema (HCC) 11/18/2015   PCP:  Cleotilde Oneil FALCON, MD Pharmacy:   [facility] - [place], [place] - [address] [place] [address] Phone: [phone] Fax: [phone]     Social Drivers of Health (SDOH) Social History: SDOH Screenings   Food Insecurity: No Food Insecurity (04/06/2024)  Housing: Low Risk (04/06/2024)  Recent Concern: Housing - High Risk (03/20/2024)   Received from [facility]  Transportation Needs: No Transportation Needs (04/06/2024)  Utilities: Not At Risk (04/06/2024)  Alcohol Screen: Low Risk (08/18/2022)  Depression (PHQ2-9): Low Risk (12/14/2022)  Financial Resource Strain: Medium Risk (08/29/2023)   Received from [facility]  Physical Activity: Inactive (08/18/2022)  Social Connections: Moderately Integrated (04/06/2024)  Stress: No Stress Concern Present (08/18/2022)  Tobacco Use: High Risk (04/08/2024)   SDOH Interventions:     Readmission Risk Interventions     No data to display

## 2024-04-08 NOTE — ED Provider Notes (Signed)
 "  [facility] Provider Note    Event Date/Time   First MD Initiated Contact with Patient 04/08/24 1007     (approximate)   History   Weakness   HPI  Stephen [name] is a 69 y.o. male advanced COPD Reviewed external records from discharge AGAINST MEDICAL ADVICE yesterday  I explained that it would be prudent to get an ABG because of report of recent confusion/agitation overnight.  However, he insisted on going home without ABG or VBG.  I explained that he is at high risk for decompensation because of advanced COPD and respiratory failure with history of intubation and mechanical ventilation in the past.   Left the hospital yesterday.  This morning again feeling weak tired a little bit confused same symptoms he had in the hospital.  His son he called and came for same.  No facial droop no difficulty speaking.  Moves all extremities normally.  He has not noticed any weakness in 1 arm or leg no numbness.  No headaches  Past Medical History:  Diagnosis Date   Allergy    seasonal   Anxiety    CAD in native artery    COPD (chronic obstructive pulmonary disease) (HCC)    Thoracic aortic atherosclerosis      Physical Exam   Triage Vital Signs: ED Triage Vitals  Encounter Vitals Group     BP 04/08/24 0937 111/69     Girls Systolic BP Percentile --      Girls Diastolic BP Percentile --      Boys Systolic BP Percentile --      Boys Diastolic BP Percentile --      Pulse Rate 04/08/24 0937 (!) 105     Resp 04/08/24 0937 (!) 21     Temp 04/08/24 0937 97.9 F (36.6 C)     Temp src --      SpO2 04/08/24 0937 97 %     Weight --      Height --      Head Circumference --      Peak Flow --      Pain Score 04/08/24 0936 0     Pain Loc --      Pain Education --      Exclude from Growth Chart --     Most recent vital signs: Vitals:   04/08/24 1300 04/08/24 1305  BP: 115/64   Pulse: 81 83  Resp: 17 (!) 22  Temp:    SpO2: 97% 97%      General: Awake, no distress.  Seems chest to have a slight confusion or slight slowness in thought.  There is no nystagmus moves all extremities normally facial expressions are normal speech is clear.  No pronator drift in extremity normal sensation across arms and legs bilaterally. CV:   Good peripheral perfusion. Normal rate and heart tones. Resp:   Normal effort. Lung sounds diminished in the bases and there is slight expiratory wheezing l. Speaking without distress. Abd:   No distention. Soft, non-tender to palpation in all quadrants. No rebound or guarding. Neuro:   No focal neuro deficits noted. Moves extremities well without noted concern. Other:     ED Results / Procedures / Treatments   Labs (all labs ordered are listed, but only abnormal results are displayed) Labs Reviewed  COMPREHENSIVE METABOLIC PANEL WITH GFR - Abnormal; Notable for the following components:      Result Value   Chloride 97 (*)    CO2 36 (*)  Creatinine, Ser 0.57 (*)    Total Protein 6.2 (*)    AST 11 (*)    All other components within normal limits  BLOOD GAS, VENOUS - Abnormal; Notable for the following components:   pCO2, Ven 81 (*)    pO2, Ven 52 (*)    Bicarbonate 40.8 (*)    Acid-Base Excess 10.9 (*)    All other components within normal limits  TROPONIN T, HIGH SENSITIVITY - Abnormal; Notable for the following components:   Troponin T High Sensitivity 33 (*)    All other components within normal limits  CBC  MAGNESIUM    Labs interpreted as primarily chronic hypercapnic respiratory disease.  Overall metabolic panel without severe acute derangement although elevated CO2 is also noted.  pCO2 81  Normal CBC.  EKG  I independently reviewed the EKG inter by me at 1440 heart rate 90 QRS 100 QTc 415 Sinus rhythm no evidence of acute ischemia   RADIOLOGY Prior imaging in hospital, based on current exam history and ongoing nature of symptomatology I doubt repeat chest x-ray would be of  high yield at this time    PROCEDURES:  Critical Care performed: Yes, see critical care procedure note(s)  Procedures  CRITICAL CARE Performed by: Oneil Budge  Patient with altered mental status, hyper Respiratory failure requiring BiPAP  Total critical care time: 35 minutes  Critical care time was exclusive of separately billable procedures and treating other patients.  Critical care was necessary to treat or prevent imminent or life-threatening deterioration.  Critical care was time spent personally by me on the following activities: development of treatment plan with patient and/or surrogate as well as nursing, discussions with consultants, evaluation of patient's response to treatment, examination of patient, obtaining history from patient or surrogate, ordering and performing treatments and interventions, ordering and review of laboratory studies, ordering and review of radiographic studies, pulse oximetry and re-evaluation of patient's condition.   MEDICATIONS ORDERED IN ED: Medications  sodium chloride  flush (NS) 0.9 % injection 3 mL (3 mLs Intravenous Given 04/08/24 1226)  acetaminophen  (TYLENOL ) tablet 650 mg (has no administration in time range)    Or  acetaminophen  (TYLENOL ) suppository 650 mg (has no administration in time range)  senna-docusate (Senokot-S) tablet 1 tablet (has no administration in time range)  heparin  injection 5,000 Units (5,000 Units Subcutaneous Given 04/08/24 1437)  ondansetron  (ZOFRAN ) tablet 4 mg (has no administration in time range)    Or  ondansetron  (ZOFRAN ) injection 4 mg (has no administration in time range)  LORazepam  (ATIVAN ) injection 0.25 mg (has no administration in time range)  budesonide  (PULMICORT ) nebulizer solution 0.5 mg (has no administration in time range)  predniSONE  (DELTASONE ) tablet 40 mg (has no administration in time range)  predniSONE  (DELTASONE ) tablet 40 mg (40 mg Oral Given 04/08/24 1215)  ipratropium-albuterol  (DUONEB)  0.5-2.5 (3) MG/3ML nebulizer solution 3 mL (3 mLs Nebulization Given 04/08/24 1225)     IMPRESSION / MDM / ASSESSMENT AND PLAN / ED COURSE  I reviewed the triage vital signs and the nursing notes.                              Based on presentation, the differential diagnosis includes, but is not limited to key considerations: altered mental DIFFERENTIAL DIAGNOSIS CONSIDERATIONS: High-acuity etiologies considered and prioritized for rule-out: - Hypoglycemia - Hypoxia / Hypercarbia - Sepsis / Meningitis / Encephalitis - Intracranial Hemorrhage / Stroke - Wernicke's Encephalopathy - Hypertensive Encephalopathy -  Toxidrome / Withdrawal - Thyroid  Storm / Myxedema   Patient's presentation most consistent with recurrent hypercarbia.  Further workup is anticipated by will initiate therapies albuterol  steroids BiPAP.  Blood gas interpreted as concerning for hypercarbia with significant chronicity but I suspect some element of acute acuity driving his altered mental status.  He had recent extensive workup in the hospital,     Workup focused on identifying reversible metabolic, infectious, or structural causes.    Patient's presentation is most consistent with acute presentation with potential threat to life or bodily function.    The patient is on the cardiac monitor to evaluate for evidence of arrhythmia and/or significant heart rate changes.  Clinical Course as of 04/08/24 1523  Mon Apr 08, 2024  1140 Communicating with nursing staff to advise of patient needing to initiate BiPAP, nebulizer therapy, pending ECG and admission [MQ]    Clinical Course User Index [MQ] Dicky Anes, MD   Patient admitted to the hospital service under the care of Dr. KANDICE Bathe, for which he is agreeable  FINAL CLINICAL IMPRESSION(S) / ED DIAGNOSES   Final diagnoses:  Hypercapnic respiratory failure, chronic (HCC)     Rx / DC Orders   ED Discharge Orders     None        Note:  This document  was prepared using Dragon voice recognition software and may include unintentional dictation errors.   Dicky Anes, MD 04/08/24 1524  "

## 2024-04-08 NOTE — ED Triage Notes (Signed)
 Pt to ED for weakness x 1 week. Pt left AMA yesterday, was admitted for COPD, hypercapnia . Pt states he came back because he thought having stroke, states these sx started a week ago.

## 2024-04-08 NOTE — H&P (Signed)
 " History and Physical    Stephen [name] FMW:[id number] DOB: April 22, 1955 DOA: 04/08/2024  DOS: the patient was seen and examined on 04/08/2024  PCP: Cleotilde Oneil FALCON, MD   Patient coming from: Home  I have personally briefly reviewed patient's old medical records in [facility] and CareEverywhere  HPI:   Stephen [name] is a 69 y.o. year old male with medical history of hypertension, hyperlipidemia, type 2 diabetes, CHF (EF 55%, G1 DD) and 07/2023 presenting to the ED with shortness of breath and weakness.  States his coughing has improved since last admission.  Recently admitted for this after he left AMA and previous admission for this less than 1 month ago he was intubated.  Patient reports no URI symptoms but does report 1 pack/day/smoking.   At the time of interview, patient is on BiPAP. On arrival to the ED patient was noted to be HDS stable.  CBC without leukocytosis or anemia.  CMP with mild hypokalemia and elevation of bicarb otherwise unremarkable.  Troponin mildly elevated with repeat pending.  VBG showed 7.3/81/52 and given significantly elevated bicarb patient was placed on BiPAP and TRH was consulted for admission.    Review of Systems: As mentioned in the history of present illness. All other systems reviewed and are negative.   Past Medical History:  Diagnosis Date   Allergy    seasonal   Anxiety    CAD in native artery    COPD (chronic obstructive pulmonary disease) (HCC)    Thoracic aortic atherosclerosis     History reviewed. No pertinent surgical history.   Allergies[1]  Family History  Problem Relation Age of Onset   Hypertension Mother    Hypothyroidism Mother    Alzheimer's disease Father    Alzheimer's disease Brother     Prior to Admission medications  Medication Sig Start Date End Date Taking? Authorizing Provider  albuterol  (VENTOLIN  HFA) 108 (90 Base) MCG/ACT inhaler INHALE 2 PUFFS INTO THE LUNGS EVERY 6 HOURS AS NEEDED FOR WHEEZING ORSHORTNESS OF  BREATH 03/10/24  Yes Wouk, Devaughn Sayres, MD  aspirin  EC 81 MG tablet Take 1 tablet (81 mg total) by mouth daily. Swallow whole. 08/25/21  Yes Sowles, Krichna, MD  azithromycin  (ZITHROMAX ) 250 MG tablet Take 250 mg by mouth every other day. 03/20/24 06/18/24 Yes [provider]  BREZTRI  AEROSPHERE 160-9-4.8 MCG/ACT AERO inhaler Inhale 2 puffs into the lungs 2 (two) times daily. 07/20/23 07/19/24 Yes [provider]  clonazePAM  (KLONOPIN ) 1 MG tablet Take 1 mg by mouth 2 (two) times daily as needed for anxiety.   Yes [provider]  ipratropium-albuterol  (DUONEB) 0.5-2.5 (3) MG/3ML SOLN Inhale 3 mLs into the lungs 3 (three) times daily. 06/26/23 06/20/24 Yes [provider]  Methylcobalamin (B-12) 1000 MCG TBDP PLACE 1 TABLET UNDER THE TONGUE DAILY 08/15/22  Yes Sowles, Krichna, MD  roflumilast  (DALIRESP ) 500 MCG TABS tablet Take 500 mcg by mouth daily.   Yes [provider]  tamsulosin  (FLOMAX ) 0.4 MG CAPS capsule Take 2 capsules (0.8 mg total) by mouth daily. 10/05/23  Yes Wouk, Devaughn Sayres, MD  traZODone  (DESYREL ) 100 MG tablet Take 100 mg by mouth at bedtime. 07/13/23  Yes [provider]  varenicline (CHANTIX) 0.5 MG tablet Take 0.5 mg by mouth. 03/20/24 04/19/24 Yes [provider]  buPROPion  (WELLBUTRIN  XL) 150 MG 24 hr tablet Take 150 mg by mouth daily. Patient not taking: Reported on 03/07/2024 05/16/23   [provider]  nicotine  (NICODERM CQ  - DOSED IN  MG/24 HOURS) 21 mg/24hr patch Place 1 patch (21 mg total) onto the skin daily. Patient not taking: Reported on 04/08/2024 10/06/23   Kandis Devaughn Sayres, MD  torsemide (DEMADEX) 5 MG tablet Take 5 mg by mouth daily as needed. Patient not taking: Reported on 04/08/2024 10/24/23 10/23/24  [provider]    Social History:  reports that he has been smoking cigarettes. He has a 45 pack-year smoking history. He has never used smokeless tobacco. He reports that he does not drink  alcohol and does not use drugs.    Physical Exam: Vitals:   04/08/24 1250 04/08/24 1255 04/08/24 1300 04/08/24 1305  BP:   115/64   Pulse: 87 91 81 83  Resp: (!) 23 16 17  (!) 22  Temp:      TempSrc:      SpO2: 98% 98% 97% 97%    Gen: NAD HENT: On BiPAP CV: Diminished air movement, no wheezing present Lung: CTAB Abd: No TTP, normal bowel sounds MSK: No asymmetry, good bulk and tone Neuro: alert and oriented, no focal deficits   Labs on Admission: I have personally reviewed following labs and imaging studies  CBC: Recent Labs  Lab 04/05/24 1232 04/06/24 0517 04/08/24 1116  WBC 6.1 4.0 6.1  HGB 13.9 13.4 13.8  HCT 42.3 40.9 42.7  MCV 98.4 98.3 100.0  PLT 194 215 194   Basic Metabolic Panel: Recent Labs  Lab 04/05/24 1232 04/06/24 0517 04/08/24 1116  NA 135 137 139  K 4.7 4.9 4.1  CL 94* 96* 97*  CO2 36* 34* 36*  GLUCOSE 96 133* 97  BUN 8 10 12   CREATININE 0.59* 0.53* 0.57*  CALCIUM  9.4 9.4 9.2  MG 1.8  --   --    GFR: Estimated Creatinine Clearance: 97 mL/min (A) (by C-G formula based on SCr of 0.57 mg/dL (L)). Liver Function Tests: Recent Labs  Lab 04/05/24 1232 04/08/24 1116  AST 17 11*  ALT 9 11  ALKPHOS 66 61  BILITOT 0.6 0.4  PROT 6.4* 6.2*  ALBUMIN 3.7 3.7   No results for input(s): LIPASE, AMYLASE in the last 168 hours. No results for input(s): AMMONIA in the last 168 hours. Coagulation Profile: No results for input(s): INR, PROTIME in the last 168 hours. Cardiac Enzymes: No results for input(s): CKTOTAL, CKMB, CKMBINDEX, TROPONINI, TROPONINIHS in the last 168 hours. BNP (last 3 results) Recent Labs    10/01/23 0844  BNP 77.2   HbA1C: No results for input(s): HGBA1C in the last 72 hours. CBG: Recent Labs  Lab 04/06/24 0858 04/06/24 1139 04/06/24 1722 04/06/24 2259 04/07/24 0812  GLUCAP 98 148* 169* 132* 89   Lipid Profile: No results for input(s): CHOL, HDL, LDLCALC, TRIG, CHOLHDL,  LDLDIRECT in the last 72 hours. Thyroid  Function Tests: No results for input(s): TSH, T4TOTAL, FREET4, T3FREE, THYROIDAB in the last 72 hours. Anemia Panel: No results for input(s): VITAMINB12, FOLATE, FERRITIN, TIBC, IRON, RETICCTPCT in the last 72 hours. Urine analysis:    Component Value Date/Time   COLORURINE AMBER (A) 03/07/2024 1815   APPEARANCEUR CLOUDY (A) 03/07/2024 1815   LABSPEC 1.026 03/07/2024 1815   PHURINE 5.0 03/07/2024 1815   GLUCOSEU NEGATIVE 03/07/2024 1815   HGBUR NEGATIVE 03/07/2024 1815   BILIRUBINUR NEGATIVE 03/07/2024 1815   KETONESUR NEGATIVE 03/07/2024 1815   PROTEINUR 100 (A) 03/07/2024 1815   NITRITE NEGATIVE 03/07/2024 1815   LEUKOCYTESUR NEGATIVE 03/07/2024 1815    Radiological Exams on Admission: I have personally reviewed images No results  found.  EKG: My personal interpretation of EKG shows: Pending  Assessment/Plan Principal Problem:   Acute on chronic respiratory failure with hypercapnia (HCC) Active Problems:   Tobacco use disorder   COPD (chronic obstructive pulmonary disease) (HCC)   DM type 2 with diabetic mixed hyperlipidemia (HCC)   Patient with acute on chronic respiratory failure with hypercapnia who left AMA and presents again with worsening breathing, weakness and confusion found to have significant hypercarbia.  He was started on treatment for COPD exacerbation but has not completed this.  Will complete his treatment and continue BiPAP for him.  He states he is to get BiPAP at home as well.  Will consult TOC to assist with this.  Will consult pulmonology for their recommendations.   Tobacco use disorder: Discussed complete cessation.  Patient is amenable.  Will need to continue discussion prior to discharge and start Chantix if patient is amenable.   Hyperlipidemia: Continue home statin   Type 2 diabetes: Not on any pharmacotherapy.  Will continue to monitor CBGs with SSI.   CHF: Not on any diuretic.   Euvolemic on exam.  Will continue to monitor   VTE prophylaxis:  SQ Heparin   Diet: N.p.o. Code Status:  Full Code Telemetry:  Admission status: Inpatient, Progressive Patient is from: Home Anticipated d/c is to: Home Anticipated d/c is in: 2-3 days   Family Communication: Updated at bedside  Consults called: Pulmonology   Severity of Illness: The appropriate patient status for this patient is INPATIENT. Inpatient status is judged to be reasonable and necessary in order to provide the required intensity of service to ensure the patient's safety. The patient's presenting symptoms, physical exam findings, and initial radiographic and laboratory data in the context of their chronic comorbidities is felt to place them at high risk for further clinical deterioration. Furthermore, it is not anticipated that the patient will be medically stable for discharge from the hospital within 2 midnights of admission.   * I certify that at the point of admission it is my clinical judgment that the patient will require inpatient hospital care spanning beyond 2 midnights from the point of admission due to high intensity of service, high risk for further deterioration and high frequency of surveillance required.DEWAINE Morene Bathe, MD [place] DEL. [facility]     [1] No Known Allergies  "

## 2024-04-08 NOTE — ED Notes (Signed)
 Into room 9. RT at [facility]. Initiating Bipap. Son at [facility]. Returned to monitor.

## 2024-04-08 NOTE — ED Notes (Signed)
 Up to b/r, unsteady ataxic gait. Back to stretcher, remains on O2. Son present. Urine sample saved. Remains alert, NAD, calm, interactive.

## 2024-04-09 ENCOUNTER — Inpatient Hospital Stay

## 2024-04-09 ENCOUNTER — Other Ambulatory Visit: Payer: Self-pay

## 2024-04-09 DIAGNOSIS — J9622 Acute and chronic respiratory failure with hypercapnia: Secondary | ICD-10-CM | POA: Diagnosis not present

## 2024-04-09 LAB — BASIC METABOLIC PANEL WITH GFR
Anion gap: 4 — ABNORMAL LOW (ref 5–15)
BUN: 14 mg/dL (ref 8–23)
CO2: 38 mmol/L — ABNORMAL HIGH (ref 22–32)
Calcium: 8.8 mg/dL — ABNORMAL LOW (ref 8.9–10.3)
Chloride: 96 mmol/L — ABNORMAL LOW (ref 98–111)
Creatinine, Ser: 0.57 mg/dL — ABNORMAL LOW (ref 0.61–1.24)
GFR, Estimated: >60 mL/min
Glucose, Bld: 85 mg/dL (ref 70–99)
Potassium: 4.9 mmol/L (ref 3.5–5.1)
Sodium: 138 mmol/L (ref 135–145)

## 2024-04-09 LAB — CBC
HCT: 42.9 % (ref 39.0–52.0)
Hemoglobin: 13.6 g/dL (ref 13.0–17.0)
MCH: 32.2 pg (ref 26.0–34.0)
MCHC: 31.7 g/dL (ref 30.0–36.0)
MCV: 101.7 fL — ABNORMAL HIGH (ref 80.0–100.0)
Platelets: 213 K/uL (ref 150–400)
RBC: 4.22 MIL/uL (ref 4.22–5.81)
RDW: 12.2 % (ref 11.5–15.5)
WBC: 4.8 K/uL (ref 4.0–10.5)
nRBC: 0 % (ref 0.0–0.2)

## 2024-04-09 LAB — GLUCOSE, CAPILLARY: Glucose-Capillary: 86 mg/dL (ref 70–99)

## 2024-04-09 LAB — CBG MONITORING, ED: Glucose-Capillary: 159 mg/dL — ABNORMAL HIGH (ref 70–99)

## 2024-04-09 MED ORDER — PREDNISONE 10 MG PO TABS: ORAL_TABLET | ORAL | 0 refills | Status: AC

## 2024-04-09 MED ORDER — IPRATROPIUM-ALBUTEROL 0.5-2.5 (3) MG/3ML IN SOLN
3.0000 mL | Freq: Four times a day (QID) | RESPIRATORY_TRACT | Status: DC
  Administered 2024-04-09: 3 mL via RESPIRATORY_TRACT
  Filled 2024-04-09: qty 3

## 2024-04-09 MED ORDER — PREDNISONE 20 MG PO TABS: 40.0000 mg | ORAL_TABLET | Freq: Every day | ORAL | Status: DC

## 2024-04-09 MED ORDER — TRAZODONE HCL 100 MG PO TABS: 100.0000 mg | ORAL_TABLET | Freq: Every day | ORAL | Status: DC

## 2024-04-09 MED ORDER — INSULIN ASPART 100 UNIT/ML IJ SOLN: 0.0000 [IU] | Freq: Three times a day (TID) | INTRAMUSCULAR | Status: DC

## 2024-04-09 MED ORDER — AZITHROMYCIN 250 MG PO TABS
250.0000 mg | ORAL_TABLET | Freq: Every day | ORAL | Status: DC
  Administered 2024-04-09: 250 mg via ORAL
  Filled 2024-04-09: qty 1

## 2024-04-09 MED ORDER — TAMSULOSIN HCL 0.4 MG PO CAPS
0.8000 mg | ORAL_CAPSULE | Freq: Every day | ORAL | Status: DC
  Administered 2024-04-09: 0.8 mg via ORAL
  Filled 2024-04-09: qty 2

## 2024-04-09 MED ORDER — INSULIN ASPART 100 UNIT/ML IJ SOLN: 0.0000 [IU] | Freq: Every day | INTRAMUSCULAR | Status: DC

## 2024-04-09 NOTE — Discharge Summary (Signed)
 Stephen [name] FMW:[id number] DOB: [birth year] DOA: 04/08/2024  PCP: Cleotilde Oneil FALCON, MD  Admit date: 04/08/2024 Discharge date: 04/09/2024  Time spent: 35 minutes  Recommendations for Outpatient Follow-up:  F/u with [facility] tomorrow for bipap training and receipt of bipap F/u pcp 1 week F/u pulmonology Ongoing smoking cessation efforts     Discharge Diagnoses:  Principal Problem:   Acute on chronic respiratory failure with hypercapnia (HCC) Active Problems:   Tobacco use disorder   COPD (chronic obstructive pulmonary disease) (HCC)   DM type 2 with diabetic mixed hyperlipidemia (HCC)   Hyperlipidemia   Discharge Condition: stable  Diet recommendation: heart healthy  There were no vitals filed for this visit.  History of present illness:  Stephen [name] is a 69 y.o. year old male with medical history of hypertension, hyperlipidemia, type 2 diabetes, CHF (EF 55%, G1 DD) and 07/2023 presenting to the ED with shortness of breath and weakness.  States his coughing has improved since last admission.  Recently admitted for this after he left AMA and previous admission for this less than 1 month ago he was intubated.  Patient reports no URI symptoms but does report 1 pack/day/smoking.   At the time of interview, patient is on BiPAP. On arrival to the ED patient was noted to be HDS stable.  CBC without leukocytosis or anemia.  CMP with mild hypokalemia and elevation of bicarb otherwise unremarkable.  Troponin mildly elevated with repeat pending.  VBG showed 7.3/81/52 and given significantly elevated bicarb patient was placed on BiPAP and [facility] was consulted for admission.   Hospital Course:   # COPD exacerbation Two recent admissions for such. Required intubation in December. Left AMA day prior to this admission. Saw pulm as outpt recently and bipap ordered, still in process of getting that arranged. Current exacerbation much improved. CXR clear. Covid/flu/rsv from 1/2 negative. Stable for d/c per  pulm. Has bipap training tomorrow morning and per TOC will receive his machine then - continue current outpt therapeutics, discharging with prednisone  taper - f/u pcp, pulm  # Acute hypoxic hypercarbic respiratory failure Required bipap on admission, has chronic hypercarbia - continue [place] o2  - bipap as above   # Tobacco abuse - resume home chantix   # prediabetes Glucose appropriate   # BPH - home flomax    # GAD - home benzo  Procedures: none   Consultations: pulm  Discharge Exam: Vitals:   04/09/24 0510 04/09/24 0814  BP: 129/63 (!) 103/58  Pulse: 85 88  Resp: (!) 22 16  Temp: 98 F (36.7 C) 98.1 F (36.7 C)  SpO2: 100% 100%    General: NAD Cardiovascular: rrr Respiratory: few faint rhonchi, otherwise clear, no tachypnea or increased wob  Discharge Instructions   Discharge Instructions     Increase activity slowly   Complete by: As directed       Allergies as of 04/09/2024   No Known Allergies      Medication List     STOP taking these medications    buPROPion  150 MG 24 hr tablet Commonly known as: WELLBUTRIN  XL   nicotine  21 mg/24hr patch Commonly known as: NICODERM CQ  - dosed in mg/24 hours   torsemide 5 MG tablet Commonly known as: DEMADEX       TAKE these medications    albuterol  108 (90 Base) MCG/ACT inhaler Commonly known as: Ventolin  HFA INHALE 2 PUFFS INTO THE LUNGS EVERY 6 HOURS AS NEEDED FOR WHEEZING ORSHORTNESS OF BREATH   aspirin  EC 81 MG  tablet Take 1 tablet (81 mg total) by mouth daily. Swallow whole.   azithromycin  250 MG tablet Commonly known as: ZITHROMAX  Take 250 mg by mouth every other day.   B-12 1000 MCG Tbdp PLACE 1 TABLET UNDER THE TONGUE DAILY   Breztri  Aerosphere 160-9-4.8 MCG/ACT Aero inhaler Generic drug: budesonide -glycopyrrolate -formoterol  Inhale 2 puffs into the lungs 2 (two) times daily.   clonazePAM  1 MG tablet Commonly known as: KLONOPIN  Take 1 mg by mouth 2 (two) times daily as needed for  anxiety.   ipratropium-albuterol  0.5-2.5 (3) MG/3ML Soln Commonly known as: DUONEB Inhale 3 mLs into the lungs 3 (three) times daily.   predniSONE  10 MG tablet Commonly known as: DELTASONE  4 tabs daily for 3 days then 3 tabs daily for 3 days then 2 tabs daily for 3 days then 1 tab daily for 3 days   roflumilast  500 MCG Tabs tablet Commonly known as: DALIRESP  Take 500 mcg by mouth daily.   tamsulosin  0.4 MG Caps capsule Commonly known as: FLOMAX  Take 2 capsules (0.8 mg total) by mouth daily.   traZODone  100 MG tablet Commonly known as: DESYREL  Take 100 mg by mouth at bedtime.   varenicline 0.5 MG tablet Commonly known as: CHANTIX Take 0.5 mg by mouth.       Allergies[1]  Follow-up Information     Cleotilde Oneil FALCON, MD Follow up.   Specialty: Internal Medicine Why: one wek Contact information: [address] [address] [facility] [place] [address] [phone]         Parris Manna, MD Follow up.   Specialty: Pulmonary Disease Contact information: [address] [place] [address] [phone]                  The results of significant diagnostics from this hospitalization (including imaging, microbiology, ancillary and laboratory) are listed below for reference.    Significant Diagnostic Studies: DG Chest Port 1 View Result Date: 04/09/2024 EXAM: 1 VIEW(S) XRAY OF THE CHEST 04/09/2024 07:40:00 AM COMPARISON: 04/05/2024 CLINICAL HISTORY: 69 year old male COPD exacerbation (HCC) FINDINGS: LUNGS AND PLEURA: Hyperinflated lungs with flattened diaphragms and emphysema on prior CTA last year. Bilateral costophrenic angle blunting is chronic. No pneumothorax. HEART AND MEDIASTINUM: Atherosclerotic plaque noted. No acute abnormality of the cardiac and mediastinal silhouettes. BONES AND SOFT TISSUES: No acute osseous abnormality. IMPRESSION: 1. Chronic hyperinflation with emphysema. No acute cardiopulmonary abnormality.  Electronically signed by: Helayne Hurst MD 04/09/2024 07:46 AM EST RP Workstation: [id number]   DG Chest Portable 1 View Result Date: 04/05/2024 EXAM: 1 VIEW(S) XRAY OF THE CHEST 04/05/2024 12:28:21 PM COMPARISON: 03/08/2024 CLINICAL HISTORY: sob FINDINGS: LINES, TUBES AND DEVICES: Endotracheal tube and enteric tube removed. LUNGS AND PLEURA: No focal pulmonary opacity. Bilateral trace pleural effusions. No pneumothorax. HEART AND MEDIASTINUM: Atherosclerotic plaque noted. No acute abnormality of the cardiac and mediastinal silhouettes. BONES AND SOFT TISSUES: No acute osseous abnormality. IMPRESSION: 1. Bilateral trace pleural effusions. 2. Interval removal of the endotracheal and enteric tube. Electronically signed by: Morgane Naveau MD 04/05/2024 01:33 PM EST RP Workstation: [id number]    Microbiology: Recent Results (from the past 240 hours)  Resp panel by RT-PCR (RSV, Flu A&B, Covid) Anterior Nasal Swab     Status: None   Collection Time: 04/05/24 12:32 PM   Specimen: Anterior Nasal Swab  Result Value Ref Range Status   SARS Coronavirus 2 by RT PCR NEGATIVE NEGATIVE Final    Comment: (NOTE) SARS-CoV-2 target nucleic acids are NOT DETECTED.  The  SARS-CoV-2 RNA is generally detectable in upper respiratory specimens during the acute phase of infection. The lowest concentration of SARS-CoV-2 viral copies this assay can detect is 138 copies/mL. A negative result does not preclude SARS-Cov-2 infection and should not be used as the sole basis for treatment or other patient management decisions. A negative result may occur with  improper specimen collection/handling, submission of specimen other than nasopharyngeal swab, presence of viral mutation(s) within the areas targeted by this assay, and inadequate number of viral copies(<138 copies/mL). A negative result must be combined with clinical observations, patient history, and epidemiological information. The expected result is Negative.  Fact  Sheet for Patients:  bloggercourse.com  Fact Sheet for Healthcare Providers:  seriousbroker.it  This test is no t yet approved or cleared by the [place]  FDA and  has been authorized for detection and/or diagnosis of SARS-CoV-2 by FDA under an Emergency Use Authorization (EUA). This EUA will remain  in effect (meaning this test can be used) for the duration of the COVID-19 declaration under Section 564(b)(1) of the Act, 21 U.S.C.section 360bbb-3(b)(1), unless the authorization is terminated  or revoked sooner.       Influenza A by PCR NEGATIVE NEGATIVE Final   Influenza B by PCR NEGATIVE NEGATIVE Final    Comment: (NOTE) The Xpert Xpress SARS-CoV-2/FLU/RSV plus assay is intended as an aid in the diagnosis of influenza from Nasopharyngeal swab specimens and should not be used as a sole basis for treatment. Nasal washings and aspirates are unacceptable for Xpert Xpress SARS-CoV-2/FLU/RSV testing.  Fact Sheet for Patients: bloggercourse.com  Fact Sheet for Healthcare Providers: seriousbroker.it  This test is not yet approved or cleared by the [place]  FDA and has been authorized for detection and/or diagnosis of SARS-CoV-2 by FDA under an Emergency Use Authorization (EUA). This EUA will remain in effect (meaning this test can be used) for the duration of the COVID-19 declaration under Section 564(b)(1) of the Act, 21 U.S.C. section 360bbb-3(b)(1), unless the authorization is terminated or revoked.     Resp Syncytial Virus by PCR NEGATIVE NEGATIVE Final    Comment: (NOTE) Fact Sheet for Patients: bloggercourse.com  Fact Sheet for Healthcare Providers: seriousbroker.it  This test is not yet approved or cleared by the [place]  FDA and has been authorized for detection and/or diagnosis of SARS-CoV-2 by FDA under an  Emergency Use Authorization (EUA). This EUA will remain in effect (meaning this test can be used) for the duration of the COVID-19 declaration under Section 564(b)(1) of the Act, 21 U.S.C. section 360bbb-3(b)(1), unless the authorization is terminated or revoked.  Performed at [facility], [address] [place], [place] [address]      Labs: Basic Metabolic Panel: Recent Labs  Lab 04/05/24 1232 04/06/24 0517 04/08/24 1116 04/09/24 0326  NA 135 137 139 138  K 4.7 4.9 4.1 4.9  CL 94* 96* 97* 96*  CO2 36* 34* 36* 38*  GLUCOSE 96 133* 97 85  BUN 8 10 12 14   CREATININE 0.59* 0.53* 0.57* 0.57*  CALCIUM  9.4 9.4 9.2 8.8*  MG 1.8  --  1.9  --    Liver Function Tests: Recent Labs  Lab 04/05/24 1232 04/08/24 1116  AST 17 11*  ALT 9 11  ALKPHOS 66 61  BILITOT 0.6 0.4  PROT 6.4* 6.2*  ALBUMIN 3.7 3.7   No results for input(s): LIPASE, AMYLASE in the last 168 hours. No results for input(s): AMMONIA in the last 168 hours. CBC: Recent Labs  Lab  04/05/24 1232 04/06/24 0517 04/08/24 1116 04/09/24 0326  WBC 6.1 4.0 6.1 4.8  HGB 13.9 13.4 13.8 13.6  HCT 42.3 40.9 42.7 42.9  MCV 98.4 98.3 100.0 101.7*  PLT 194 215 194 213   Cardiac Enzymes: No results for input(s): CKTOTAL, CKMB, CKMBINDEX, TROPONINI in the last 168 hours. BNP: BNP (last 3 results) Recent Labs    10/01/23 0844  BNP 77.2    ProBNP (last 3 results) No results for input(s): PROBNP in the last 8760 hours.  CBG: Recent Labs  Lab 04/06/24 1722 04/06/24 2259 04/07/24 0812 04/09/24 0101 04/09/24 0820  GLUCAP 169* 132* 89 159* 86       Signed:  Devaughn KATHEE Ban MD.  [facility] 04/09/2024, 10:15 AM     [1] No Known Allergies

## 2024-04-09 NOTE — Consult Note (Signed)
 "    PULMONOLOGY         Date: 04/09/2024,   MRN# [id number] Stephen [name] [birth year]     Admission                  Current   Referring provider: Dr Fernand   CHIEF COMPLAINT:   Acute respiratory distress with hypoxemia   HISTORY OF PRESENT ILLNESS   This is a 69 year old male with a PMH of essential hypertension, dyslipidemia, type 2 diabetes, diastolic heart failure, recurrent admissions for dyspnea with respiratory distress previously admitted in April 2025 as well as approximately 1 month ago where he was intubated and hospitalized in the intensive care unit however after short stay improved and was discharged.  During that hospitalization he had severe encephalopathy with hypercapnia and was discharged on BiPAP.  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  On this admission he had venous blood gases showing mild hypercapnic hypoxemic respiratory failure, CBC with mild leukocytosis.  He was placed on BiPAP for COPD exacerbation with hypercapnia.  He had respiratory viral panel performed which was negative for 20 pathogens.  Viral triplet for RSV influenza AB and COVID are also negative.  During my evaluation is requiring approximately 3 to 4 L/min nasal cannula supplemental oxygen.  PCCM consultation placed for acute on chronic hypoxemic respiratory failure with hypercapnia.   PAST MEDICAL HISTORY   Past Medical History:  Diagnosis Date   Allergy    seasonal   Anxiety    CAD in native artery    COPD (chronic obstructive pulmonary disease) (HCC)    Thoracic aortic atherosclerosis      SURGICAL HISTORY   History reviewed. No pertinent surgical history.   FAMILY HISTORY   Family History  Problem Relation Age of Onset   Hypertension Mother    Hypothyroidism Mother    Alzheimer's disease Father    Alzheimer's  disease Brother      SOCIAL HISTORY   Social History[1]   MEDICATIONS    Home Medication:  Current Outpatient Rx   Order #: [id number] Class: Normal   Order #: [id number] Class: OTC   Order #: [id number] Class: Historical Med   Order #: [id number] Class: Historical Med   Order #: [id number] Class: Historical Med   Order #: [id number] Class: Historical Med   Order #: [id number] Class: Normal   Order #: [id number] Class: Historical Med   Order #: [id number] Class: No Print   Order #: [id number] Class: Historical Med   Order #: [id number] Class: Historical Med   Order #: [id number] Class: Historical Med   Order #: [id number] Class: Normal   Order #: [id number] Class: Historical Med    Current Medication: Current Medications[2]    ALLERGIES   Patient has no known allergies.     REVIEW OF SYSTEMS    Review of Systems:  Gen:  Denies  fever, sweats, chills weigh loss  HEENT: Denies blurred vision, double vision, ear pain, eye pain, hearing loss, nose bleeds, sore throat Cardiac:  No dizziness, chest pain or heaviness, chest tightness,edema Resp:   reports dyspnea chronically  Gi: Denies swallowing difficulty, stomach pain, nausea or vomiting, diarrhea, constipation, bowel incontinence Gu:  Denies bladder incontinence, burning urine Ext:   Denies Joint pain, stiffness or swelling Skin: Denies  skin rash, easy bruising or bleeding or hives Endoc:  Denies polyuria, polydipsia , polyphagia or weight change Psych:  Denies depression, insomnia or hallucinations   Other:  All other systems negative   VS: BP 129/63 (BP Location: Right Arm)   Pulse 85   Temp 98 F (36.7 C) (Oral)   Resp (!) 22   SpO2 100%      PHYSICAL EXAM    GENERAL:NAD, no fevers, chills, no weakness no fatigue HEAD: Normocephalic, atraumatic.  EYES: Pupils equal, round, reactive to light. Extraocular muscles intact. No scleral icterus.  MOUTH: Moist mucosal membrane. Dentition intact. No abscess noted.  EAR,  NOSE, THROAT: Clear without exudates. No external lesions.  NECK: Supple. No thyromegaly. No nodules. No JVD.  PULMONARY: decreased breath sounds with mild rhonchi worse at bases bilaterally.  CARDIOVASCULAR: S1 and S2. Regular rate and rhythm. No murmurs, rubs, or gallops. No edema. Pedal pulses 2+ bilaterally.  GASTROINTESTINAL: Soft, nontender, nondistended. No masses. Positive bowel sounds. No hepatosplenomegaly.  MUSCULOSKELETAL: No swelling, clubbing, or edema. Range of motion full in all extremities.  NEUROLOGIC: Cranial nerves II through XII are intact. No gross focal neurological deficits. Sensation intact. Reflexes intact.  SKIN: No ulceration, lesions, rashes, or cyanosis. Skin warm and dry. Turgor intact.  PSYCHIATRIC: Mood, affect within normal limits. The patient is awake, alert and oriented x 3. Insight, judgment intact.       IMAGING     ASSESSMENT/PLAN   Acute exacerbation of COPD - severe with hypercapnia    - Patient on BiPAP support    -Repeat CXR to rule out pneumothorax/infiltrate/effusion   -Continue chest physiotherapy with flutter valve and incentive spirometry   - Physical therapy while in hospital   - [facility] consultation to ensure patient has proper supplies for BiPAP at home as well as educational session to confirm patient knows how to use device properly  - Currently the patient is on p.o. prednisone  40 mg as well as nebulized Pulmicort  0.5 mg twice daily. - will add Zithromax  p.o. for COPD with exacerbation - DuoNeb nebulizer therapy has been ordered - Viral workup is negative thus far -resp gram stain and culture  - DC planning  - Will continue to follow along - Patient DNR with advanced COPD recurrent hospitalizations and active smoking with tobacco dependence.  Consider palliative care consultation             Thank you for allowing me to participate in the care of this patient.   Patient/Family are satisfied with care plan and all  questions have been answered.    Provider disclosure: Patient with at least one acute or chronic illness or injury that poses a threat to life or bodily function and is being managed actively during this encounter.  All of the below services have been performed independently by signing provider:  review of prior documentation from internal and or external health records.  Review of previous and current lab results.  Interview and comprehensive assessment during patient visit today. Review of current and previous chest radiographs/CT scans. Discussion of management and test interpretation with health care team and patient/family.   This document was prepared using Dragon voice recognition software and may include unintentional dictation errors.     Elianah Karis, M.D.  Division of Pulmonary & Critical Care Medicine             [1]  Social History Tobacco Use   Smoking status: Every Day    Current packs/day: 1.00    Average packs/day: 1 pack/day for 45.0 years (45.0 ttl pk-yrs)    Types: Cigarettes   Smokeless  tobacco: Never  Vaping Use   Vaping status: Never Used  Substance Use Topics   Alcohol use: No    Alcohol/week: 0.0 standard drinks of alcohol   Drug use: No  [2]  Current Facility-Administered Medications:    acetaminophen  (TYLENOL ) tablet 650 mg, 650 mg, Oral, Q6H PRN **OR** acetaminophen  (TYLENOL ) suppository 650 mg, 650 mg, Rectal, Q6H PRN, Fernand Prost, MD   budesonide  (PULMICORT ) nebulizer solution 0.5 mg, 0.5 mg, Nebulization, BID, Khan, Ghalib, MD, 0.5 mg at 04/08/24 1954   clonazePAM  (KLONOPIN ) tablet 1 mg, 1 mg, Oral, BID PRN, Fernand Prost, MD, 1 mg at 04/09/24 0102   heparin  injection 5,000 Units, 5,000 Units, Subcutaneous, Q8H, Fernand Prost, MD, 5,000 Units at 04/09/24 9377   insulin  aspart (novoLOG ) injection 0-5 Units, 0-5 Units, Subcutaneous, QHS, Fernand Prost, MD   insulin  aspart (novoLOG ) injection 0-9 Units, 0-9 Units, Subcutaneous, TID WC, Fernand Prost, MD   ipratropium-albuterol  (DUONEB) 0.5-2.5 (3) MG/3ML nebulizer solution 3 mL, 3 mL, Nebulization, Q6H, Fernand Prost, MD   ondansetron  (ZOFRAN ) tablet 4 mg, 4 mg, Oral, Q6H PRN **OR** ondansetron  (ZOFRAN ) injection 4 mg, 4 mg, Intravenous, Q6H PRN, Fernand Prost, MD   predniSONE  (DELTASONE ) tablet 40 mg, 40 mg, Oral, Q breakfast, Fernand Prost, MD   senna-docusate (Senokot-S) tablet 1 tablet, 1 tablet, Oral, QHS PRN, Fernand Prost, MD   sodium chloride  flush (NS) 0.9 % injection 3 mL, 3 mL, Intravenous, Q12H, Fernand Prost, MD, 3 mL at 04/08/24 2206  Current Outpatient Medications:    albuterol  (VENTOLIN  HFA) 108 (90 Base) MCG/ACT inhaler, INHALE 2 PUFFS INTO THE LUNGS EVERY 6 HOURS AS NEEDED FOR WHEEZING ORSHORTNESS OF BREATH, Disp: 18 g, Rfl: 1   aspirin  EC 81 MG tablet, Take 1 tablet (81 mg total) by mouth daily. Swallow whole., Disp: 30 tablet, Rfl: 12   azithromycin  (ZITHROMAX ) 250 MG tablet, Take 250 mg by mouth every other day., Disp: , Rfl:    BREZTRI  AEROSPHERE 160-9-4.8 MCG/ACT AERO inhaler, Inhale 2 puffs into the lungs 2 (two) times daily., Disp: , Rfl:    clonazePAM  (KLONOPIN ) 1 MG tablet, Take 1 mg by mouth 2 (two) times daily as needed for anxiety., Disp: , Rfl:    ipratropium-albuterol  (DUONEB) 0.5-2.5 (3) MG/3ML SOLN, Inhale 3 mLs into the lungs 3 (three) times daily., Disp: , Rfl:    Methylcobalamin (B-12) 1000 MCG TBDP, PLACE 1 TABLET UNDER THE TONGUE DAILY, Disp: 150 tablet, Rfl: 3   roflumilast  (DALIRESP ) 500 MCG TABS tablet, Take 500 mcg by mouth daily., Disp: , Rfl:    tamsulosin  (FLOMAX ) 0.4 MG CAPS capsule, Take 2 capsules (0.8 mg total) by mouth daily., Disp: , Rfl:    traZODone  (DESYREL ) 100 MG tablet, Take 100 mg by mouth at bedtime., Disp: , Rfl:    varenicline (CHANTIX) 0.5 MG tablet, Take 0.5 mg by mouth., Disp: , Rfl:    buPROPion  (WELLBUTRIN  XL) 150 MG 24 hr tablet, Take 150 mg by mouth daily. (Patient not taking: Reported on 03/07/2024), Disp: , Rfl:     nicotine  (NICODERM CQ  - DOSED IN MG/24 HOURS) 21 mg/24hr patch, Place 1 patch (21 mg total) onto the skin daily. (Patient not taking: Reported on 04/08/2024), Disp: 28 patch, Rfl: 0   torsemide (DEMADEX) 5 MG tablet, Take 5 mg by mouth daily as needed. (Patient not taking: Reported on 04/08/2024), Disp: , Rfl:   "

## 2024-04-09 NOTE — Progress Notes (Signed)
 " PROGRESS NOTE    Stephen [name]  FMW:[id number] DOB: 01/31/1956 DOA: 04/08/2024 PCP: Cleotilde Oneil FALCON, MD  Outpatient Specialists: pulm    Brief Narrative:   Stephen [name] is a 69 y.o. year old male with medical history of hypertension, hyperlipidemia, type 2 diabetes, CHF (EF 55%, G1 DD) and 07/2023 presenting to the ED with shortness of breath and weakness.  States his coughing has improved since last admission.  Recently admitted for this after he left AMA and previous admission for this less than 1 month ago he was intubated.  Patient reports no URI symptoms but does report 1 pack/day/smoking.   At the time of interview, patient is on BiPAP. On arrival to the ED patient was noted to be HDS stable.  CBC without leukocytosis or anemia.  CMP with mild hypokalemia and elevation of bicarb otherwise unremarkable.  Troponin mildly elevated with repeat pending.  VBG showed 7.3/81/52 and given significantly elevated bicarb patient was placed on BiPAP and TRH was consulted for admission.   Assessment & Plan:   Principal Problem:   Acute on chronic respiratory failure with hypercapnia (HCC) Active Problems:   Tobacco use disorder   COPD (chronic obstructive pulmonary disease) (HCC)   DM type 2 with diabetic mixed hyperlipidemia (HCC)   Hyperlipidemia  # COPD exacerbation Two recent admissions for such. Required intubation in December. Left AMA day prior to this admission. Saw pulm as outpt recently and bipap ordered, still in process of getting that arranged. Current exacerbation much improved. CXR clear. Covid/flu/rsv from 1/2 negative. - continue bipap at bedtime, will speak with TOC about making arrangements for home use - continue prednisone , azithromycin   # Acute hypoxic hypercarbic respiratory failure Required bipap on admission, has chronic hypercarbia - continue [place] o2  - continue bipap qhs  # Tobacco abuse - declines patch  # T2DM Glucose appropriate - ssi  # BPH - home  flomax   # GAD - home benzo    DVT prophylaxis: lovenox  Code Status: dnr/dni Family Communication: none today  Level of care: Progressive Status is: Inpatient Remains inpatient appropriate because: severity of illness    Consultants:  pccm  Procedures: none  Antimicrobials:  azithromycin     Subjective:  Reports breathing much better, stable  Objective: Vitals:   04/08/24 2205 04/09/24 0046 04/09/24 0510 04/09/24 0814  BP: 122/66 122/76 129/63 (!) 103/58  Pulse: 80 84 85 88  Resp: (!) 22 (!) 26 (!) 22 16  Temp: 97.6 F (36.4 C) 97.8 F (36.6 C) 98 F (36.7 C) 98.1 F (36.7 C)  TempSrc: Oral Oral Oral Oral  SpO2: 100% 100% 100% 100%   No intake or output data in the 24 hours ending 04/09/24 0926  There were no vitals filed for this visit.   Examination:  General exam: Appears calm and comfortable  Respiratory system: few faint scattered rhonchi otherwise clear no wheeze Cardiovascular system: S1 & S2 heard, RRR. No JVD, murmurs, rubs, gallops or clicks. No pedal edema. Gastrointestinal system: Abdomen is nondistended, soft and nontender. No organomegaly or masses felt. Normal bowel sounds heard. Central nervous system: Alert and oriented. No focal neurological deficits. Extremities: Symmetric 5 x 5 power. Skin: No rashes, lesions or ulcers Psychiatry: Judgement and insight appear normal. Mood depressed    Data Reviewed: I have personally reviewed following labs and imaging studies  CBC: Recent Labs  Lab 04/05/24 1232 04/06/24 0517 04/08/24 1116 04/09/24 0326  WBC 6.1 4.0 6.1 4.8  HGB 13.9 13.4 13.8 13.6  HCT 42.3 40.9 42.7 42.9  MCV 98.4 98.3 100.0 101.7*  PLT 194 215 194 213   Basic Metabolic Panel: Recent Labs  Lab 04/05/24 1232 04/06/24 0517 04/08/24 1116 04/09/24 0326  NA 135 137 139 138  K 4.7 4.9 4.1 4.9  CL 94* 96* 97* 96*  CO2 36* 34* 36* 38*  GLUCOSE 96 133* 97 85  BUN 8 10 12 14   CREATININE 0.59* 0.53* 0.57* 0.57*   CALCIUM  9.4 9.4 9.2 8.8*  MG 1.8  --  1.9  --    GFR: Estimated Creatinine Clearance: 97 mL/min (A) (by C-G formula based on SCr of 0.57 mg/dL (L)). Liver Function Tests: Recent Labs  Lab 04/05/24 1232 04/08/24 1116  AST 17 11*  ALT 9 11  ALKPHOS 66 61  BILITOT 0.6 0.4  PROT 6.4* 6.2*  ALBUMIN 3.7 3.7   No results for input(s): LIPASE, AMYLASE in the last 168 hours. No results for input(s): AMMONIA in the last 168 hours. Coagulation Profile: No results for input(s): INR, PROTIME in the last 168 hours. Cardiac Enzymes: No results for input(s): CKTOTAL, CKMB, CKMBINDEX, TROPONINI in the last 168 hours. BNP (last 3 results) No results for input(s): PROBNP in the last 8760 hours. HbA1C: No results for input(s): HGBA1C in the last 72 hours.  CBG: Recent Labs  Lab 04/06/24 1722 04/06/24 2259 04/07/24 0812 04/09/24 0101 04/09/24 0820  GLUCAP 169* 132* 89 159* 86   Lipid Profile: No results for input(s): CHOL, HDL, LDLCALC, TRIG, CHOLHDL, LDLDIRECT in the last 72 hours.  Thyroid  Function Tests: No results for input(s): TSH, T4TOTAL, FREET4, T3FREE, THYROIDAB in the last 72 hours. Anemia Panel: No results for input(s): VITAMINB12, FOLATE, FERRITIN, TIBC, IRON, RETICCTPCT in the last 72 hours. Urine analysis:    Component Value Date/Time   COLORURINE AMBER (A) 03/07/2024 1815   APPEARANCEUR CLOUDY (A) 03/07/2024 1815   LABSPEC 1.026 03/07/2024 1815   PHURINE 5.0 03/07/2024 1815   GLUCOSEU NEGATIVE 03/07/2024 1815   HGBUR NEGATIVE 03/07/2024 1815   BILIRUBINUR NEGATIVE 03/07/2024 1815   KETONESUR NEGATIVE 03/07/2024 1815   PROTEINUR 100 (A) 03/07/2024 1815   NITRITE NEGATIVE 03/07/2024 1815   LEUKOCYTESUR NEGATIVE 03/07/2024 1815   Sepsis Labs: @LABRCNTIP (procalcitonin:4,lacticidven:4)  ) Recent Results (from the past 240 hours)  Resp panel by RT-PCR (RSV, Flu A&B, Covid) Anterior Nasal Swab     Status:  None   Collection Time: 04/05/24 12:32 PM   Specimen: Anterior Nasal Swab  Result Value Ref Range Status   SARS Coronavirus 2 by RT PCR NEGATIVE NEGATIVE Final    Comment: (NOTE) SARS-CoV-2 target nucleic acids are NOT DETECTED.  The SARS-CoV-2 RNA is generally detectable in upper respiratory specimens during the acute phase of infection. The lowest concentration of SARS-CoV-2 viral copies this assay can detect is 138 copies/mL. A negative result does not preclude SARS-Cov-2 infection and should not be used as the sole basis for treatment or other patient management decisions. A negative result may occur with  improper specimen collection/handling, submission of specimen other than nasopharyngeal swab, presence of viral mutation(s) within the areas targeted by this assay, and inadequate number of viral copies(<138 copies/mL). A negative result must be combined with clinical observations, patient history, and epidemiological information. The expected result is Negative.  Fact Sheet for Patients:  bloggercourse.com  Fact Sheet for Healthcare Providers:  seriousbroker.it  This test is no t yet approved or cleared by the [place]  FDA and  has been authorized for detection and/or diagnosis  of SARS-CoV-2 by FDA under an Emergency Use Authorization (EUA). This EUA will remain  in effect (meaning this test can be used) for the duration of the COVID-19 declaration under Section 564(b)(1) of the Act, 21 U.S.C.section 360bbb-3(b)(1), unless the authorization is terminated  or revoked sooner.       Influenza A by PCR NEGATIVE NEGATIVE Final   Influenza B by PCR NEGATIVE NEGATIVE Final    Comment: (NOTE) The Xpert Xpress SARS-CoV-2/FLU/RSV plus assay is intended as an aid in the diagnosis of influenza from Nasopharyngeal swab specimens and should not be used as a sole basis for treatment. Nasal washings and aspirates are unacceptable  for Xpert Xpress SARS-CoV-2/FLU/RSV testing.  Fact Sheet for Patients: bloggercourse.com  Fact Sheet for Healthcare Providers: seriousbroker.it  This test is not yet approved or cleared by the [place]  FDA and has been authorized for detection and/or diagnosis of SARS-CoV-2 by FDA under an Emergency Use Authorization (EUA). This EUA will remain in effect (meaning this test can be used) for the duration of the COVID-19 declaration under Section 564(b)(1) of the Act, 21 U.S.C. section 360bbb-3(b)(1), unless the authorization is terminated or revoked.     Resp Syncytial Virus by PCR NEGATIVE NEGATIVE Final    Comment: (NOTE) Fact Sheet for Patients: bloggercourse.com  Fact Sheet for Healthcare Providers: seriousbroker.it  This test is not yet approved or cleared by the [place]  FDA and has been authorized for detection and/or diagnosis of SARS-CoV-2 by FDA under an Emergency Use Authorization (EUA). This EUA will remain in effect (meaning this test can be used) for the duration of the COVID-19 declaration under Section 564(b)(1) of the Act, 21 U.S.C. section 360bbb-3(b)(1), unless the authorization is terminated or revoked.  Performed at [facility], [address] [place], [place] [address]          Radiology Studies: DG Chest [place] 1 View Result Date: 04/09/2024 EXAM: 1 VIEW(S) XRAY OF THE CHEST 04/09/2024 07:40:00 AM COMPARISON: 04/05/2024 CLINICAL HISTORY: 69 year old male COPD exacerbation (HCC) FINDINGS: LUNGS AND PLEURA: Hyperinflated lungs with flattened diaphragms and emphysema on prior CTA last year. Bilateral costophrenic angle blunting is chronic. No pneumothorax. HEART AND MEDIASTINUM: Atherosclerotic plaque noted. No acute abnormality of the cardiac and mediastinal silhouettes. BONES AND SOFT TISSUES: No acute osseous abnormality. IMPRESSION: 1.  Chronic hyperinflation with emphysema. No acute cardiopulmonary abnormality. Electronically signed by: Helayne Hurst MD 04/09/2024 07:46 AM EST RP Workstation: [id number]        Scheduled Meds:  azithromycin   250 mg Oral Daily   budesonide  (PULMICORT ) nebulizer solution  0.5 mg Nebulization BID   heparin   5,000 Units Subcutaneous Q8H   insulin  aspart  0-5 Units Subcutaneous QHS   insulin  aspart  0-9 Units Subcutaneous TID WC   ipratropium-albuterol   3 mL Nebulization Q6H   sodium chloride  flush  3 mL Intravenous Q12H   Continuous Infusions:     LOS: 1 day     Devaughn KATHEE Ban, MD [facility]   If 7PM-7AM, please contact night-coverage [email or web address] Password [email or web address] 04/09/2024, 9:26 AM     "

## 2024-04-09 NOTE — ED Notes (Signed)
 RN called the admin unit and inquired if there were any questions on this pt and to have sign off completed.

## 2024-05-09 ENCOUNTER — Encounter

## 2024-05-09 DIAGNOSIS — J449 Chronic obstructive pulmonary disease, unspecified: Secondary | ICD-10-CM

## 2024-05-09 NOTE — Progress Notes (Signed)
 Initial phone call completed. Diagnosis can be found in [facility] 1/21. EP Orientation scheduled for Monday 2/9 at 9am.

## 2024-05-13 ENCOUNTER — Encounter
